# Patient Record
Sex: Female | Born: 2000 | Race: White | Hispanic: No | Marital: Single | Smoking: Current every day smoker
Health system: Southern US, Community
[De-identification: ages and names within clinical notes are randomized; demographics above are authoritative.]

## PROBLEM LIST (undated history)

## (undated) DIAGNOSIS — F419 Anxiety disorder, unspecified: Secondary | ICD-10-CM

## (undated) DIAGNOSIS — Z23 Encounter for immunization: Secondary | ICD-10-CM

## (undated) DIAGNOSIS — F32A Depression, unspecified: Secondary | ICD-10-CM

## (undated) DIAGNOSIS — F1411 Cocaine abuse, in remission: Secondary | ICD-10-CM

## (undated) DIAGNOSIS — F329 Major depressive disorder, single episode, unspecified: Secondary | ICD-10-CM

## (undated) DIAGNOSIS — A749 Chlamydial infection, unspecified: Secondary | ICD-10-CM

## (undated) DIAGNOSIS — H539 Unspecified visual disturbance: Secondary | ICD-10-CM

## (undated) HISTORY — DX: Encounter for immunization: Z23

## (undated) HISTORY — DX: Cocaine abuse, in remission: F14.11

## (undated) HISTORY — DX: Chlamydial infection, unspecified: A74.9

---
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
## 2020-02-07 ENCOUNTER — Encounter: Payer: Self-pay | Admitting: Psychiatry

## 2020-05-08 ENCOUNTER — Encounter: Payer: Self-pay | Admitting: Obstetrics and Gynecology

## 2020-06-11 ENCOUNTER — Ambulatory Visit (INDEPENDENT_AMBULATORY_CARE_PROVIDER_SITE_OTHER): Payer: 59 | Admitting: Obstetrics and Gynecology

## 2020-06-11 ENCOUNTER — Encounter: Payer: Self-pay | Admitting: Obstetrics and Gynecology

## 2020-06-11 ENCOUNTER — Other Ambulatory Visit (HOSPITAL_COMMUNITY)
Admission: RE | Admit: 2020-06-11 | Discharge: 2020-06-11 | Disposition: A | Discharge status: Home / Self Care | Payer: 59 | Source: Ambulatory Visit | Attending: Obstetrics and Gynecology | Admitting: Obstetrics and Gynecology

## 2020-06-11 ENCOUNTER — Other Ambulatory Visit: Payer: Self-pay

## 2020-06-11 VITALS — BP 100/70 | Ht 67.5 in | Wt 122.0 lb

## 2020-06-11 DIAGNOSIS — R1010 Upper abdominal pain, unspecified: Secondary | ICD-10-CM

## 2020-06-11 DIAGNOSIS — Z113 Encounter for screening for infections with a predominantly sexual mode of transmission: Secondary | ICD-10-CM | POA: Diagnosis not present

## 2020-06-11 DIAGNOSIS — Z01419 Encounter for gynecological examination (general) (routine) without abnormal findings: Secondary | ICD-10-CM | POA: Diagnosis not present

## 2020-06-11 NOTE — Progress Notes (Signed)
PCP:  Gildardo Pounds, MD   Chief Complaint  Patient presents with  . Gynecologic Exam    Abdominal pain x months     HPI:      Ms. NALAH MACIOCE is a 20 y.o. No obstetric history on file. whose LMP was No LMP recorded (lmp unknown). (Menstrual status: Irregular Periods)., presents today for her NP annual examination.  Her menses were regular every 28-30 days, lasting 5 days on OCPs/sprintec.  Dysmenorrhea mild, occurring first 1-2 days of flow. She does not have intermenstrual bleeding. Stopped OCPs 1 1/2 months ago since not needed for contraception. No menses yet but is starting to get cramping. Hx of menses Q6 wks age 21 prior to OCPs.    Sex activity: has sex with females. Had female partner in past Last Pap: N/A due to age Hx of STDs: none  Having issues with abd pain for several months, with occas vomiting/diarrhea. Thinks it's related to the many meds she is on for psych. Sx improved with eating; often times feels like gas pains. Has decreased appetite.  There is no FH of breast cancer. There is no FH of ovarian cancer. The patient does do self-breast exams.  Tobacco use: vapes daily Alcohol use: infrequent Daily marijuana use.  Exercise: moderately active  She does get adequate calcium but not Vitamin D in her diet.   Gardasil completed.   Past Medical History:  Diagnosis Date  . Anxiety   . Depression   . Vision abnormalities    wears glasses    Past Surgical History:  Procedure Laterality Date  . INGUINAL HERNIA PEDIATRIC WITH LAPAROSCOPIC EXAM Left 04/22/2017   Procedure: INGUINAL HERNIA PEDIATRIC WITH LAP LOOK OF PELVIC FLOOR;  Surgeon: Leonia Corona, MD;  Location: [facility];  Service: Pediatrics;  Laterality: Left;  . INGUINAL HERNIA REPAIR Right 2008    History reviewed. No pertinent family history.  Social History   Socioeconomic History  . Marital status: Single    Spouse name: Not on file  . Number of children: Not on file  .  Years of education: Not on file  . Highest education level: Not on file  Occupational History  . Not on file  Tobacco Use  . Smoking status: Never Smoker  . Smokeless tobacco: Never Used  Vaping Use  . Vaping Use: Every day  Substance and Sexual Activity  . Alcohol use: No  . Drug use: Yes    Types: Marijuana    Comment: social  . Sexual activity: Yes    Birth control/protection: None    Comment: same sex partner  Other Topics Concern  . Not on file  Social History Narrative  . Not on file   Social Determinants of Health   Financial Resource Strain: Not on file  Food Insecurity: Not on file  Transportation Needs: Not on file  Physical Activity: Not on file  Stress: Not on file  Social Connections: Not on file  Intimate Partner Violence: Not on file     Current Outpatient Medications:  .  ARIPiprazole (ABILIFY) 5 MG tablet, Take 5 mg by mouth every morning., Disp: , Rfl:  .  busPIRone (BUSPAR) 10 MG tablet, Take 20 mg by mouth 2 (two) times daily., Disp: , Rfl:  .  escitalopram (LEXAPRO) 10 MG tablet, Take 10 mg by mouth daily., Disp: , Rfl:  .  guanFACINE (INTUNIV) 2 MG TB24 ER tablet, Take 2 mg by mouth 2 (two) times daily., Disp: ,  Rfl:  .  lamoTRIgine (LAMICTAL) 200 MG tablet, Take 1 tablet (200 mg total) by mouth at bedtime., Disp: 90 tablet, Rfl: 2 .  lithium carbonate (LITHOBID) 300 MG CR tablet, SMARTSIG:1 Tablet(s) By Mouth Every Evening, Disp: , Rfl:  .  Multiple Vitamin (MULTIVITAMIN) capsule, Take 1 capsule by mouth daily., Disp: , Rfl:  .  prazosin (MINIPRESS) 5 MG capsule, SMARTSIG:1 Capsule(s) By Mouth Every Evening, Disp: , Rfl:      ROS:  Review of Systems  Constitutional: Negative for fatigue, fever and unexpected weight change.  Respiratory: Negative for cough, shortness of breath and wheezing.   Cardiovascular: Negative for chest pain, palpitations and leg swelling.  Gastrointestinal: Positive for abdominal pain, diarrhea and vomiting. Negative  for blood in stool, constipation and nausea.  Endocrine: Negative for cold intolerance, heat intolerance and polyuria.  Genitourinary: Negative for dyspareunia, dysuria, flank pain, frequency, genital sores, hematuria, menstrual problem, pelvic pain, urgency, vaginal bleeding, vaginal discharge and vaginal pain.  Musculoskeletal: Negative for back pain, joint swelling and myalgias.  Skin: Negative for rash.  Neurological: Negative for dizziness, syncope, light-headedness, numbness and headaches.  Hematological: Negative for adenopathy.  Psychiatric/Behavioral: Negative for agitation, confusion, sleep disturbance and suicidal ideas. The patient is not nervous/anxious.   BREAST: No symptoms   Objective: BP 100/70   Ht 5' 7.5" (1.715 m)   Wt 122 lb (55.3 kg)   LMP  (LMP Unknown)   BMI 18.83 kg/m    Physical Exam Constitutional:      Appearance: She is well-developed.  Genitourinary:     Vulva normal.     Right Labia: No rash, tenderness or lesions.    Left Labia: No tenderness, lesions or rash.    No vaginal discharge, erythema or tenderness.      Right Adnexa: not tender and no mass present.    Left Adnexa: not tender and no mass present.    No cervical friability or polyp.     Uterus is not enlarged or tender.  Breasts:     Right: No mass, nipple discharge, skin change or tenderness.     Left: No mass, nipple discharge, skin change or tenderness.    Neck:     Thyroid: No thyromegaly.  Cardiovascular:     Rate and Rhythm: Normal rate and regular rhythm.     Heart sounds: Normal heart sounds. No murmur heard.   Pulmonary:     Effort: Pulmonary effort is normal.     Breath sounds: Normal breath sounds.  Abdominal:     Palpations: Abdomen is soft.     Tenderness: There is no abdominal tenderness. There is no guarding or rebound.  Musculoskeletal:        General: Normal range of motion.     Cervical back: Normal range of motion.  Lymphadenopathy:     Cervical: No  cervical adenopathy.  Neurological:     General: No focal deficit present.     Mental Status: She is alert and oriented to person, place, and time.     Cranial Nerves: No cranial nerve deficit.  Skin:    General: Skin is warm and dry.  Psychiatric:        Mood and Affect: Mood normal.        Behavior: Behavior normal.        Thought Content: Thought content normal.        Judgment: Judgment normal.  Vitals reviewed.     Assessment/Plan: Encounter for annual routine gynecological examination  Screening for STD (sexually transmitted disease) - Plan: Cervicovaginal ancillary only  Pain of upper abdomen--being followed by psych. Neg exam. Question side effect of Rx.     GYN counsel adequate intake of calcium and vitamin D, diet and exercise     F/U  Return in about 1 year (around 06/11/2021).  Alicia B. Copland, PA-C 06/11/2020 3:16 PM

## 2020-06-11 NOTE — Patient Instructions (Signed)
I value your feedback and you entrusting us with your care. If you get a [facility] patient survey, I would appreciate you taking the time to let us know about your experience today. Thank you! ? ? ?

## 2020-06-13 LAB — CERVICOVAGINAL ANCILLARY ONLY
Chlamydia: NEGATIVE
Comment: NEGATIVE
Comment: NORMAL
Neisseria Gonorrhea: NEGATIVE

## 2020-12-16 ENCOUNTER — Telehealth: Payer: 59

## 2020-12-16 DIAGNOSIS — R1013 Epigastric pain: Secondary | ICD-10-CM

## 2020-12-16 DIAGNOSIS — R112 Nausea with vomiting, unspecified: Secondary | ICD-10-CM

## 2020-12-16 NOTE — Telephone Encounter (Signed)
Amy aware.

## 2020-12-16 NOTE — Telephone Encounter (Signed)
Order placed, GI will contact pt with appt info

## 2020-12-16 NOTE — Telephone Encounter (Signed)
Pt's mom, Amy, calling; pt is having a lot of stomach issues; would like a referral to GI.  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  Has [employer] ins; would like GI that accepts [place].

## 2021-02-10 ENCOUNTER — Other Ambulatory Visit: Payer: Self-pay

## 2021-02-10 ENCOUNTER — Inpatient Hospital Stay (HOSPITAL_COMMUNITY)
Admission: RE | Admit: 2021-02-10 | Discharge: 2021-02-17 | DRG: 885 | Disposition: A | Discharge status: Home / Self Care | Payer: 59 | Attending: Psychiatry | Admitting: Psychiatry

## 2021-02-10 ENCOUNTER — Encounter (HOSPITAL_COMMUNITY): Payer: Self-pay | Admitting: Psychiatry

## 2021-02-10 DIAGNOSIS — Z3202 Encounter for pregnancy test, result negative: Secondary | ICD-10-CM | POA: Diagnosis present

## 2021-02-10 DIAGNOSIS — Z79899 Other long term (current) drug therapy: Secondary | ICD-10-CM | POA: Diagnosis not present

## 2021-02-10 DIAGNOSIS — Z20822 Contact with and (suspected) exposure to covid-19: Secondary | ICD-10-CM | POA: Diagnosis present

## 2021-02-10 DIAGNOSIS — F4312 Post-traumatic stress disorder, chronic: Secondary | ICD-10-CM | POA: Diagnosis present

## 2021-02-10 DIAGNOSIS — R45851 Suicidal ideations: Secondary | ICD-10-CM | POA: Diagnosis present

## 2021-02-10 DIAGNOSIS — F3130 Bipolar disorder, current episode depressed, mild or moderate severity, unspecified: Secondary | ICD-10-CM | POA: Diagnosis present

## 2021-02-10 DIAGNOSIS — F3163 Bipolar disorder, current episode mixed, severe, without psychotic features: Secondary | ICD-10-CM | POA: Diagnosis not present

## 2021-02-10 DIAGNOSIS — F3160 Bipolar disorder, current episode mixed, unspecified: Secondary | ICD-10-CM | POA: Diagnosis present

## 2021-02-10 DIAGNOSIS — F1410 Cocaine abuse, uncomplicated: Secondary | ICD-10-CM | POA: Diagnosis present

## 2021-02-10 DIAGNOSIS — F1210 Cannabis abuse, uncomplicated: Secondary | ICD-10-CM

## 2021-02-10 LAB — RESP PANEL BY RT-PCR (FLU A&B, COVID) ARPGX2
Influenza A by PCR: NEGATIVE
Influenza B by PCR: NEGATIVE
SARS Coronavirus 2 by RT PCR: NEGATIVE

## 2021-02-10 MED ORDER — NICOTINE 21 MG/24HR TD PT24
21.0000 mg | MEDICATED_PATCH | Freq: Every day | TRANSDERMAL | Status: DC
  Administered 2021-02-10 – 2021-02-17 (×8): 21 mg via TRANSDERMAL
  Filled 2021-02-10 (×10): qty 1

## 2021-02-10 MED ORDER — ALUM & MAG HYDROXIDE-SIMETH 200-200-20 MG/5ML PO SUSP: 30.0000 mL | ORAL | Status: DC | PRN

## 2021-02-10 MED ORDER — TRAZODONE HCL 50 MG PO TABS
50.0000 mg | ORAL_TABLET | Freq: Every evening | ORAL | Status: DC | PRN
  Administered 2021-02-11 – 2021-02-16 (×6): 50 mg via ORAL
  Filled 2021-02-10 (×6): qty 1

## 2021-02-10 MED ORDER — HYDROXYZINE HCL 25 MG PO TABS
25.0000 mg | ORAL_TABLET | Freq: Three times a day (TID) | ORAL | Status: DC | PRN
  Filled 2021-02-10: qty 1

## 2021-02-10 MED ORDER — ACETAMINOPHEN 325 MG PO TABS: 650.0000 mg | ORAL_TABLET | Freq: Four times a day (QID) | ORAL | Status: DC | PRN

## 2021-02-10 MED ORDER — MAGNESIUM HYDROXIDE 400 MG/5ML PO SUSP: 30.0000 mL | Freq: Every day | ORAL | Status: DC | PRN
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
## 2021-02-10 NOTE — Group Note (Signed)
LCSW Group Therapy Note   Group Date: 02/10/2021 Start Time: 1300 End Time: 1400   Type of Therapy and Topic:  Group Therapy:   Participation Level:  Did Not Attend   Description of Group: In this process group, patients discussed using strengths to work toward goals and address challenges.  Patients identified two positive things about themselves and one goal they were working on.  Patients were given the opportunity to share openly and support each other's plan for self-empowerment.  The group discussed the value of gratitude and were encouraged to have a daily reflection of positive characteristics or circumstances.  Patients were encouraged to identify a plan to utilize their strengths to work on current challenges and goals.   Therapeutic Goals Patient will verbalize personal strengths/positive qualities and relate how these can assist with achieving desired personal goals Patients will verbalize affirmation of peers plans for personal change and goal setting Patients will explore the value of gratitude and positive focus as related to successful achievement of goals Patients will verbalize a plan for regular reinforcement of personal positive qualities and circumstances.   Summary of Patient Progress:  Did not attend      Therapeutic Modalities Cognitive Behavioral Therapy Motivational Interviewing  Aram Beecham, [place] 02/10/2021  1:54 PM

## 2021-02-10 NOTE — Plan of Care (Signed)
Nurse discussed coping skills with patient.  
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
## 2021-02-10 NOTE — BHH Group Notes (Signed)
Patient did not attend group.    Spiritual care group on grief and loss facilitated by chaplain Katy Coretta Leisey, BCC   Group Goal:   Support / Education around grief and loss   Members engage in facilitated group support and psycho-social education.   Group Description:   Following introductions and group rules, group members engaged in facilitated group dialog and support around topic of loss, with particular support around experiences of loss in their lives. Group Identified types of loss (relationships / self / things) and identified patterns, circumstances, and changes that precipitate losses. Reflected on thoughts / feelings around loss, normalized grief responses, and recognized variety in grief experience. Group noted Worden's four tasks of grief in discussion.   Group drew on Adlerian / Rogerian, narrative, MI,    

## 2021-02-10 NOTE — BHH Group Notes (Signed)
Pt didn attend tonights wrap up group.     The focus of this group is to help patients review their daily goal of treatment and discuss progress on daily workbooks.

## 2021-02-10 NOTE — Plan of Care (Signed)
Nurse discussed depression and coping skills with patient.  

## 2021-02-11 DIAGNOSIS — F1410 Cocaine abuse, uncomplicated: Secondary | ICD-10-CM | POA: Diagnosis present

## 2021-02-11 DIAGNOSIS — F3163 Bipolar disorder, current episode mixed, severe, without psychotic features: Secondary | ICD-10-CM

## 2021-02-11 DIAGNOSIS — F3160 Bipolar disorder, current episode mixed, unspecified: Secondary | ICD-10-CM | POA: Diagnosis present

## 2021-02-11 LAB — RAPID URINE DRUG SCREEN, HOSP PERFORMED
Amphetamines: NOT DETECTED
Barbiturates: NOT DETECTED
Benzodiazepines: NOT DETECTED
Cocaine: POSITIVE — AB
Opiates: NOT DETECTED
Tetrahydrocannabinol: POSITIVE — AB

## 2021-02-11 LAB — PREGNANCY, URINE: Preg Test, Ur: NEGATIVE

## 2021-02-11 MED ORDER — METHOCARBAMOL 500 MG PO TABS: 500.0000 mg | ORAL_TABLET | Freq: Three times a day (TID) | ORAL | Status: AC | PRN

## 2021-02-11 MED ORDER — LAMOTRIGINE 25 MG PO TABS
25.0000 mg | ORAL_TABLET | Freq: Every day | ORAL | Status: DC
  Administered 2021-02-11: 25 mg via ORAL
  Filled 2021-02-11 (×3): qty 1

## 2021-02-11 MED ORDER — ONDANSETRON 4 MG PO TBDP
4.0000 mg | ORAL_TABLET | Freq: Four times a day (QID) | ORAL | Status: AC | PRN
  Administered 2021-02-14: 4 mg via ORAL
  Filled 2021-02-11: qty 1

## 2021-02-11 MED ORDER — DICYCLOMINE HCL 20 MG PO TABS: 20.0000 mg | ORAL_TABLET | Freq: Four times a day (QID) | ORAL | Status: AC | PRN

## 2021-02-11 MED ORDER — ONDANSETRON 4 MG PO TBDP
ORAL_TABLET | ORAL | Status: AC
  Filled 2021-02-11: qty 1

## 2021-02-11 MED ORDER — ARIPIPRAZOLE 10 MG PO TABS
10.0000 mg | ORAL_TABLET | Freq: Every day | ORAL | Status: DC
  Administered 2021-02-11: 10 mg via ORAL
  Filled 2021-02-11 (×3): qty 1

## 2021-02-11 MED ORDER — LOPERAMIDE HCL 2 MG PO CAPS
2.0000 mg | ORAL_CAPSULE | ORAL | Status: AC | PRN
  Administered 2021-02-14: 2 mg via ORAL
  Administered 2021-02-14: 4 mg via ORAL
  Filled 2021-02-11: qty 2
  Filled 2021-02-11: qty 1

## 2021-02-11 MED ORDER — ONDANSETRON 4 MG PO TBDP: 4.0000 mg | ORAL_TABLET | Freq: Three times a day (TID) | ORAL | Status: DC | PRN

## 2021-02-11 MED ORDER — HYDROXYZINE HCL 25 MG PO TABS
25.0000 mg | ORAL_TABLET | Freq: Four times a day (QID) | ORAL | Status: AC | PRN
  Administered 2021-02-12: 25 mg via ORAL
  Filled 2021-02-11: qty 1

## 2021-02-11 MED ORDER — NAPROXEN 500 MG PO TABS: 500.0000 mg | ORAL_TABLET | Freq: Two times a day (BID) | ORAL | Status: AC | PRN

## 2021-02-11 MED ORDER — LITHIUM CARBONATE ER 300 MG PO TBCR
300.0000 mg | EXTENDED_RELEASE_TABLET | Freq: Two times a day (BID) | ORAL | Status: DC
  Administered 2021-02-11 – 2021-02-17 (×12): 300 mg via ORAL
  Filled 2021-02-11 (×18): qty 1

## 2021-02-11 MED ORDER — ARIPIPRAZOLE 15 MG PO TABS
15.0000 mg | ORAL_TABLET | Freq: Every day | ORAL | Status: DC
  Administered 2021-02-12 – 2021-02-14 (×3): 15 mg via ORAL
  Filled 2021-02-11 (×5): qty 1

## 2021-02-11 NOTE — Progress Notes (Signed)
Pt sleep much of the evening     02/11/21 0000  Psych Admission Type (Psych Patients Only)  Admission Status Voluntary  Psychosocial Assessment  Patient Complaints None  Eye Contact Fair  Facial Expression Anxious;Sad;Worried  Affect Sad;Anxious;Threatening;Depressed  Speech Logical/coherent  Interaction Assertive  Motor Activity Other (Comment) (appropriate)  Appearance/Hygiene Disheveled  Behavior Characteristics Cooperative  Mood Depressed;Anxious  Aggressive Behavior  Effect No apparent injury  Thought Process  Coherency WDL  Content WDL  Delusions None reported or observed  Perception WDL  Hallucination None reported or observed  Judgment Poor  Confusion None  Danger to Self  Current suicidal ideation? Passive  Self-Injurious Behavior Some self-injurious ideation observed or expressed.  No lethal plan expressed   Agreement Not to Harm Self Yes  Description of Agreement contracts for safety verbal  Danger to Others  Danger to Others None reported or observed

## 2021-02-11 NOTE — Group Note (Signed)
OrientationGroup Topic: Goal Setting  Group Date: 02/11/2021 Start Time: 0830 End Time: 0900 Facilitators: Kaisyn Millea, Deforest Hoyles, NT  [facility]  Number of Participants: 14  Group Focus: activities of daily living skills and goals/reality orientation  Name: BRADI ARBUTHNOT Date of Birth: 04/16/2001  MR: [id number]    Quality of Participation: Pt did not attend goals/orientation group.

## 2021-02-11 NOTE — Progress Notes (Signed)
Pt had episode of crying spell, wanting to leave ( wanted to sign 72 hr request for D/C). Writer talked to pt and pt calmed down. Pt educated on relaxation techniques and informed to talk to the doctor tomorrow and if she did not agree with what the doctor was saying then she could sign the 72 hr request for D/C then.     02/11/21 2200  Psych Admission Type (Psych Patients Only)  Admission Status Voluntary  Psychosocial Assessment  Patient Complaints Anxiety;Depression  Eye Contact Fair  Facial Expression Animated;Anxious  Affect Anxious;Depressed  Speech Logical/coherent  Interaction Assertive  Motor Activity Other (Comment) (WDL)  Appearance/Hygiene Unremarkable  Behavior Characteristics Cooperative  Mood Anxious;Depressed  Thought Process  Coherency WDL  Content WDL  Delusions None reported or observed  Perception WDL  Hallucination None reported or observed  Judgment Poor  Confusion None  Danger to Self  Current suicidal ideation? Passive  Self-Injurious Behavior Some self-injurious ideation observed or expressed.  No lethal plan expressed   Agreement Not to Harm Self Yes  Description of Agreement contracts for safety verbal  Danger to Others  Danger to Others None reported or observed
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
## 2021-02-11 NOTE — Progress Notes (Signed)
Pt denies HI/AVH but pt stated "I just want to die." "I have thoughts of hurting myself but I would not act on it." Pt verbally agrees to approach staff if these become apparent or before harming themselves/others. Rates depression 10/10. Rates anxiety 6/10. Rates pain 0/10. Pt stated that she is always depressed and is always anxious. Pt stated that she is in a bad mood. Pts goal for today is to get out of bed. Pt stated "I want to get some crack. I am a crack addict so I am craving it right now and the reason I came here is because I want to get clean." Pt stated that she was irritable because of all the questions we have to ask but understands that we have to do it. Pt has been pleasant the rest of the day and in her room for the majority of the day. Scheduled medications administered to Pt, per MD orders. RN provided support and encouragement to Pt. Q15 min safety checks implemented and continued. Pt safe on the unit. RN will continue to monitor and intervene as needed.   02/11/21 0925  Psych Admission Type (Psych Patients Only)  Admission Status Voluntary  Psychosocial Assessment  Patient Complaints Anxiety;Depression;Irritability  Eye Contact Fair  Facial Expression Animated;Anxious  Affect Anxious;Depressed  Speech Logical/coherent  Interaction Assertive  Motor Activity Other (Comment) (WDL)  Appearance/Hygiene Unremarkable  Behavior Characteristics Cooperative;Calm;Appropriate to situation  Mood Anxious;Depressed;Pleasant  Thought Process  Coherency WDL  Content WDL  Delusions None reported or observed  Perception WDL  Hallucination None reported or observed  Judgment Poor  Confusion None  Danger to Self  Current suicidal ideation? Passive  Self-Injurious Behavior Some self-injurious ideation observed or expressed.  No lethal plan expressed   Agreement Not to Harm Self Yes  Description of Agreement contracts for safety verbal  Danger to Others  Danger to Others None reported or  observed

## 2021-02-11 NOTE — [facility] Group Notes (Signed)
Pt was attentive and appropriate while attending tonight's wrap up group. Pt was able to discuss that  she has mental health and substance abuse issues. Pt stated that she has to get mental health stable then work on drug use.  The focus of this group is to help patients review their daily goal of treatment and discuss progress on daily workbooks.

## 2021-02-11 NOTE — [facility] Group Notes (Signed)
Patient did not attend the Psycho-Ed group. 

## 2021-02-12 ENCOUNTER — Encounter (HOSPITAL_COMMUNITY): Payer: Self-pay

## 2021-02-12 LAB — COMPREHENSIVE METABOLIC PANEL
ALT: 44 U/L (ref 0–44)
AST: 19 U/L (ref 15–41)
Albumin: 4 g/dL (ref 3.5–5.0)
Alkaline Phosphatase: 53 U/L (ref 38–126)
Anion gap: 6 (ref 5–15)
BUN: 8 mg/dL (ref 6–20)
CO2: 28 mmol/L (ref 22–32)
Calcium: 9 mg/dL (ref 8.9–10.3)
Chloride: 105 mmol/L (ref 98–111)
Creatinine, Ser: 0.8 mg/dL (ref 0.44–1.00)
GFR, Estimated: >60 mL/min (ref >60)
Glucose, Bld: 100 mg/dL — ABNORMAL HIGH (ref 70–99)
Potassium: 4.2 mmol/L (ref 3.5–5.1)
Sodium: 139 mmol/L (ref 135–145)
Total Bilirubin: 0.7 mg/dL (ref 0.3–1.2)
Total Protein: 6.4 g/dL — ABNORMAL LOW (ref 6.5–8.1)

## 2021-02-12 LAB — CBC
HCT: 41.9 % (ref 36.0–46.0)
Hemoglobin: 13.7 g/dL (ref 12.0–15.0)
MCH: 29.7 pg (ref 26.0–34.0)
MCHC: 32.7 g/dL (ref 30.0–36.0)
MCV: 90.7 fL (ref 80.0–100.0)
Platelets: 173 10*3/uL (ref 150–400)
RBC: 4.62 MIL/uL (ref 3.87–5.11)
RDW: 11.9 % (ref 11.5–15.5)
WBC: 4.8 10*3/uL (ref 4.0–10.5)
nRBC: 0 % (ref 0.0–0.2)

## 2021-02-12 LAB — HEMOGLOBIN A1C
Hgb A1c MFr Bld: 4.6 % — ABNORMAL LOW (ref 4.8–5.6)
Mean Plasma Glucose: 85.32 mg/dL

## 2021-02-12 LAB — LIPID PANEL
Cholesterol: 140 mg/dL (ref 0–200)
HDL: 59 mg/dL (ref >40)
LDL Cholesterol: 76 mg/dL (ref 0–99)
Total CHOL/HDL Ratio: 2.4 RATIO
Triglycerides: 27 mg/dL (ref <150)
VLDL: 5 mg/dL (ref 0–40)

## 2021-02-12 LAB — TSH: TSH: 1.54 u[IU]/mL (ref 0.350–4.500)

## 2021-02-12 LAB — LITHIUM LEVEL: Lithium Lvl: 0.27 mmol/L — ABNORMAL LOW (ref 0.60–1.20)

## 2021-02-12 MED ORDER — GABAPENTIN 100 MG PO CAPS
100.0000 mg | ORAL_CAPSULE | Freq: Three times a day (TID) | ORAL | Status: DC
  Administered 2021-02-12 – 2021-02-13 (×6): 100 mg via ORAL
  Filled 2021-02-12 (×14): qty 1

## 2021-02-12 NOTE — Progress Notes (Signed)
[facility] MD Progress Note  02/12/2021 7:09 AM Deborah Mckee  MRN:  [id number]  Chief Complaint: SI and substance abuse  Deborah Mckee is a 20 y.o. female with a history of bipolar d/o and polysubstance abuse, who was initially admitted for inpatient psychiatric hospitalization on 02/10/2021 for management of SI and mixed manic episode in the context of ongoing cannabis and cocaine abuse. The patient is currently on Hospital Day 2.   Chart Review from last 24 hours:  The patient's chart was reviewed and nursing notes were reviewed. The patient's case was discussed in multidisciplinary team meeting. She expressed passive SI to staff yesterday and stayed in her room the majority of day shift. Per nursing, overnight patient was agitated and was considering signing 72 hour request for discharge but was able to be redirected and soothed by staff. Per MAR she was compliant with scheduled medications and received Trazodone X1 PRN for sleep.   Information Obtained Today During Patient Interview: The patient was seen and evaluated on the unit. On assessment today the patient reports that she had a panic attack last night which triggered her desire to leave since she felt confined on the unit. She states she talked to her mother on the phone who told her if she signed out AMA she could not return home and would be expected to pay her hospital bill. She states this made her reconsider and decide to stay and complete her treatment. She admits she is upset with her mother but recognizes that she needs help with her bipolar and her addictions. She describes feeling "exhausted" today and denies feeling mood elevated or grandiose. She denies racing thoughts, AVH, paranoia, ideas of reference, first rank symptoms, SI or HI. She admits to having some cravings for drugs and tobacco but denies current signs of acute withdrawal other than cold chills and sweats. She reports good sleep and poor appetite stating she does not like  the food choices here. We discussed her medication regimen and she is not sure how compliant she will be with Lamictal as an outpatient. Due to risk of SJS with hap-hazard dosing of this medication, she elects to discontinue Lamictal after discussion. I advised that with her being on Lithium and Abilify I am not sure she needs additional Lamictal at this time. I discussed start of Neurontin to help with residual anxiety and cravings and she agrees to med trial. I encouraged her to talk with SW about outpatient addictions treatment options after discharge.   Principal Problem: Bipolar affective disorder, current episode mixed (HCC) Diagnosis: Principal Problem:   Bipolar affective disorder, current episode mixed (HCC) Active Problems:   Chronic post-traumatic stress disorder   Cannabis abuse   Cocaine abuse (HCC)  Total Time Spent in Direct Patient Care:  I personally spent 30 minutes on the unit in direct patient care. The direct patient care time included face-to-face time with the patient, reviewing the patient's chart, communicating with other professionals, and coordinating care. Greater than 50% of this time was spent in counseling or coordinating care with the patient regarding goals of hospitalization, psycho-education, and discharge planning needs.  Past Psychiatric History: see H&P  Past Medical History:  Past Medical History:  Diagnosis Date   Anxiety    Depression    Vaccine for human papilloma virus (HPV) types 6, 11, 16, and 18 administered    Vision abnormalities    wears glasses    Past Surgical History:  Procedure Laterality Date   INGUINAL HERNIA  PEDIATRIC WITH LAPAROSCOPIC EXAM Left 04/22/2017   Procedure: INGUINAL HERNIA PEDIATRIC WITH LAP LOOK OF PELVIC FLOOR;  Surgeon: Leonia Corona, MD;  Location: [facility];  Service: Pediatrics;  Laterality: Left;   INGUINAL HERNIA REPAIR Right 2008   Family History: see H&P  Family Psychiatric  History: see H&P    Social History:  Social History   Substance and Sexual Activity  Alcohol Use No     Social History   Substance and Sexual Activity  Drug Use Yes   Types: Marijuana   Comment: social    Social History   Socioeconomic History   Marital status: Single    Spouse name: Not on file   Number of children: Not on file   Years of education: Not on file   Highest education level: Not on file  Occupational History   Not on file  Tobacco Use   Smoking status: Never   Smokeless tobacco: Never  Vaping Use   Vaping Use: Every day  Substance and Sexual Activity   Alcohol use: No   Drug use: Yes    Types: Marijuana    Comment: social   Sexual activity: Yes    Birth control/protection: None    Comment: same sex partner  Other Topics Concern   Not on file  Social History Narrative   Not on file   Social Determinants of Health   Financial Resource Strain: Not on file  Food Insecurity: Not on file  Transportation Needs: Not on file  Physical Activity: Not on file  Stress: Not on file  Social Connections: Not on file   Additional Social History:    Pain Medications: SEE MAR Prescriptions: SEE MAR Over the Counter: SEE MAR History of alcohol / drug use?: Yes Withdrawal Symptoms: Irritability Name of Substance 1: Cocaine 1 - Age of First Use: 20 yrs old 1 - Amount (size/oz): 1 gram 1 - Frequency: daily 1 - Duration: on-going 1 - Last Use / Amount: Friday; "a couple of illness" 1 - Method of Aquiring: unknown 1- Route of Use: unnown    Sleep: Good  Appetite:  Poor  Current Medications: Current Facility-Administered Medications  Medication Dose Route Frequency Provider Last Rate Last Admin   acetaminophen (TYLENOL) tablet 650 mg  650 mg Oral Q6H PRN Thermon Leyland, NP       alum & mag hydroxide-simeth (MAALOX/MYLANTA) 200-200-20 MG/5ML suspension 30 mL  30 mL Oral Q4H PRN Fransisca Kaufmann A, NP       ARIPiprazole (ABILIFY) tablet 15 mg  15 mg Oral Daily Halah Whiteside  E, MD       dicyclomine (BENTYL) tablet 20 mg  20 mg Oral Q6H PRN Mason Jim, Nabria Nevin E, MD       hydrOXYzine (ATARAX/VISTARIL) tablet 25 mg  25 mg Oral Q6H PRN Mason Jim, Latese Dufault E, MD       lamoTRIgine (LAMICTAL) tablet 25 mg  25 mg Oral Daily Park Pope, MD   25 mg at 02/11/21 1642   lithium carbonate (LITHOBID) CR tablet 300 mg  300 mg Oral Q12H Park Pope, MD   300 mg at 02/11/21 2131   loperamide (IMODIUM) capsule 2-4 mg  2-4 mg Oral PRN Comer Locket, MD       magnesium hydroxide (MILK OF MAGNESIA) suspension 30 mL  30 mL Oral Daily PRN Fransisca Kaufmann A, NP       methocarbamol (ROBAXIN) tablet 500 mg  500 mg Oral Q8H PRN Comer Locket, MD  naproxen (NAPROSYN) tablet 500 mg  500 mg Oral BID PRN Comer Locket, MD       nicotine (NICODERM CQ - dosed in mg/24 hours) patch 21 mg  21 mg Transdermal Daily Fransisca Kaufmann A, NP   21 mg at 02/11/21 0926   ondansetron (ZOFRAN-ODT) disintegrating tablet 4 mg  4 mg Oral Q6H PRN Comer Locket, MD       traZODone (DESYREL) tablet 50 mg  50 mg Oral QHS PRN Lamar Sprinkles, MD   50 mg at 02/11/21 2130    Lab Results:  Results for orders placed or performed during the hospital encounter of 02/10/21 (from the past 48 hour(s))  Resp Panel by RT-PCR (Flu A&B, Covid) Nasopharyngeal Swab     Status: None   Collection Time: 02/10/21 12:47 PM   Specimen: Nasopharyngeal Swab; Nasopharyngeal(NP) swabs in vial transport medium  Result Value Ref Range   SARS Coronavirus 2 by RT PCR NEGATIVE NEGATIVE    Comment: (NOTE) SARS-CoV-2 target nucleic acids are NOT DETECTED.  The SARS-CoV-2 RNA is generally detectable in upper respiratory specimens during the acute phase of infection. The lowest concentration of SARS-CoV-2 viral copies this assay can detect is 138 copies/mL. A negative result does not preclude SARS-Cov-2 infection and should not be used as the sole basis for treatment or other patient management decisions. A negative result may occur with   improper specimen collection/handling, submission of specimen other than nasopharyngeal swab, presence of viral mutation(s) within the areas targeted by this assay, and inadequate number of viral copies(<138 copies/mL). A negative result must be combined with clinical observations, patient history, and epidemiological information. The expected result is Negative.  Fact Sheet for Patients:  BloggerCourse.com  Fact Sheet for Healthcare Providers:  SeriousBroker.it  This test is no t yet approved or cleared by the [place] FDA and  has been authorized for detection and/or diagnosis of SARS-CoV-2 by FDA under an Emergency Use Authorization (EUA). This EUA will remain  in effect (meaning this test can be used) for the duration of the COVID-19 declaration under Section 564(b)(1) of the Act, 21 U.S.C.section 360bbb-3(b)(1), unless the authorization is terminated  or revoked sooner.       Influenza A by PCR NEGATIVE NEGATIVE   Influenza B by PCR NEGATIVE NEGATIVE    Comment: (NOTE) The Xpert Xpress SARS-CoV-2/FLU/RSV plus assay is intended as an aid in the diagnosis of influenza from Nasopharyngeal swab specimens and should not be used as a sole basis for treatment. Nasal washings and aspirates are unacceptable for Xpert Xpress SARS-CoV-2/FLU/RSV testing.  Fact Sheet for Patients: BloggerCourse.com  Fact Sheet for Healthcare Providers: SeriousBroker.it  This test is not yet approved or cleared by the [place] FDA and has been authorized for detection and/or diagnosis of SARS-CoV-2 by FDA under an Emergency Use Authorization (EUA). This EUA will remain in effect (meaning this test can be used) for the duration of the COVID-19 declaration under Section 564(b)(1) of the Act, 21 U.S.C. section 360bbb-3(b)(1), unless the authorization is terminated or revoked.  Performed at  [facility], [address] [place], [place] [address]   Pregnancy, urine     Status: None   Collection Time: 02/11/21  1:13 PM  Result Value Ref Range   Preg Test, Ur NEGATIVE NEGATIVE    Comment:        THE SENSITIVITY OF THIS METHODOLOGY IS >20 mIU/mL. Performed at [facility], [address], [place], [place] [address]  Rapid urine drug screen (hospital performed)     Status: Abnormal   Collection Time: 02/11/21  1:13 PM  Result Value Ref Range   Opiates NONE DETECTED NONE DETECTED   Cocaine POSITIVE (A) NONE DETECTED   Benzodiazepines NONE DETECTED NONE DETECTED   Amphetamines NONE DETECTED NONE DETECTED   Tetrahydrocannabinol POSITIVE (A) NONE DETECTED   Barbiturates NONE DETECTED NONE DETECTED    Comment: (NOTE) DRUG SCREEN FOR MEDICAL PURPOSES ONLY.  IF CONFIRMATION IS NEEDED FOR ANY PURPOSE, NOTIFY LAB WITHIN 5 DAYS.  LOWEST DETECTABLE LIMITS FOR URINE DRUG SCREEN Drug Class                     Cutoff (ng/mL) Amphetamine and metabolites    1000 Barbiturate and metabolites    200 Benzodiazepine                 200 Tricyclics and metabolites     300 Opiates and metabolites        300 Cocaine and metabolites        300 THC                            50 Performed at [facility], [address] [place], [place] [address]     Physical Findings: AIMS: Facial and Oral Movements Muscles of Facial Expression: None, normal Lips and Perioral Area: None, normal Jaw: None, normal Tongue: None, normal,Extremity Movements Upper (arms, wrists, hands, fingers): None, normal Lower (legs, knees, ankles, toes): None, normal, Trunk Movements Neck, shoulders, hips: None, normal, Overall Severity Severity of abnormal movements (highest score from questions above): None, normal Incapacitation due to abnormal movements: None, normal Patient's awareness of abnormal movements (rate only patient's report): No Awareness,  Dental Status Current problems with teeth and/or dentures?: No Does patient usually wear dentures?: No   Musculoskeletal: Strength & Muscle Tone: within normal limits Gait & Station: normal Patient leans: N/A  Psychiatric Specialty Exam: Physical Exam Vitals and nursing note reviewed.  HENT:     Head: Normocephalic.  Pulmonary:     Effort: Pulmonary effort is normal.  Neurological:     General: No focal deficit present.     Mental Status: She is alert.    Review of Systems  Constitutional:  Positive for chills.  Respiratory:  Negative for shortness of breath.   Cardiovascular:  Negative for chest pain.  Gastrointestinal:  Negative for abdominal pain, constipation, diarrhea, nausea and vomiting.  Musculoskeletal:  Negative for arthralgias.  Neurological:  Negative for headaches.   Blood pressure (!) 103/49, pulse (!) 59, temperature 98.4 F (36.9 C), temperature source Oral, resp. rate 18, height 5\' 7"  (1.702 m), weight 54.4 kg, SpO2 98 %.Body mass index is 18.79 kg/m.  General Appearance:  casually dressed, adequate hygiene  Eye Contact:  Good  Speech:  Clear and Coherent and Normal Rate - no longer rapid or rambling  Volume:  Normal  Mood:  Anxious - far less expansive and grandiose appearing today  Affect:   No longer labile but appears anxious; no longer elevated  Thought Process:  Goal Directed and Linear - less tangential and circumstantial but still ruminative about discharge planning  Orientation:  Full (Time, Place, and Person)  Thought Content:  Logical and denies AVH, paranoia, or delusions  Suicidal Thoughts:  No  Homicidal Thoughts:  No  Memory:  Recent;   Good  Judgement:  Fair  Insight:  Fair  Psychomotor Activity:  Normal  Concentration:  Concentration: Fair and Attention Span: Fair  Recall:  Good  Fund of Knowledge:  Good  Language:  Good  Akathisia:  Negative  Assets:  Communication Skills Desire for Improvement Housing Resilience Social Support   ADL's:  Intact  Cognition:  WNL  Sleep:  Number of Hours: 6.5   Treatment Plan Summary: Diagnoses / Active Problems: Bipolar d/o MRE mixed Stimulant use d/o - cocaine type Cannabis use d/o Nicotine use d/o PTSD by hx R/o cluster B traits  PLAN: Safety and Monitoring:  -- Voluntary admission to inpatient psychiatric unit for safety, stabilization and treatment  -- Daily contact with patient to assess and evaluate symptoms and progress in treatment  -- Patient's case to be discussed in multi-disciplinary team meeting  -- Observation Level : q15 minute checks  -- Vital signs:  q12 hours  -- Precautions: suicide, elopement, and assault  2. Psychiatric Diagnoses and Treatment:   Bipolar d/o MRE mixed  PTSD by hx  R/O Cluster B traits -- Discontinue Lamictal due to concern for compliance with dosing -- Start Neurontin 100mg  tid titrating up as tolerated to help with anxiety and cravings -- Continue Abilify 15mg  daily for mixed manic episode/mood  -- Continue Lithium 300mg  bid for mixed manic episode/mood  - lithium level today 0.27 and TSH 1.540, creatinine 0.80; will check repeat Li level, BMP and TSH in 4 days once she has consistently been on dose -- Holding antidepressants at this time due to mixed manic presentation to make sure patient does not have further mood escalation  -- Metabolic profile and EKG monitoring obtained while on an atypical antipsychotic (Lipid Panel: WNL; HbgA1c: 4.6; )   -- Encouraged patient to participate in unit milieu and in scheduled group therapies   -- Short Term Goals: Ability to verbalize feelings will improve, Ability to disclose and discuss suicidal ideas, and Ability to identify and develop effective coping behaviors will improve  -- Long Term Goals: Improvement in symptoms so as ready for discharge   Stimulant use d/o - cocaine type Cannabis use d/o Nicotine use d/o -- UDS positive for cocaine and THC -- Continue PRN medications  for symptomatic treatment of potential withdrawal (Robaxin, Naprosyn, Vistaril, Bentyl, Zofran, Imodium, Tylenol)  -- Start Neurontin 100mg  tid for help with anxiety and cravings titrating up as tolerated -- SW to assist with outpatient SA treatment options for discharge -- Continue Nicoderm patch 21mg /24 hours for smoking cessation  -- Short Term Goals: Ability to identify triggers associated with substance abuse/mental health issues will improve  -- Long Term Goals: Improvement in symptoms so as ready for discharge   3. Medical Issues Being Addressed:   Admission labs reviewed: UPT negative; CMP WNL except for glucose 100 and total protein 6.4; CBC WNL; TSH 1.540; Li 0.27; UDS positive for cocaine and THC; Lipid panel WNL; A1c 4.6  4. Discharge Planning:   -- Social work and case management to assist with discharge planning and identification of hospital follow-up needs prior to discharge  -- Estimated LOS: 5-7 days  -- Discharge Concerns: Need to establish a safety plan; Medication compliance and effectiveness  -- Discharge Goals: Return home with outpatient referrals for mental health follow-up including medication management/psychotherapy   , MD, FAPA 02/12/2021, 7:09 AM

## 2021-02-12 NOTE — Group Note (Signed)
LCSW Group Therapy Note   Group Date: 02/12/2021 Start Time: 1300 End Time: 1330   Type of Therapy and Topic:  Group Therapy:   Therapy Type: Group Therapy  Participation Level:  Did Not Attend   Patients received a worksheet with an outline of 2 gingerbread men with a separation in the middle of the page. One sign designated what the pt sees about themselves and the other is what others see. Pts were asked to introduce themselves and share something they like about themself. Pts were then asked to draw, write or color how they view themselves as well as how they are viewed by others. CSW led discussion about the feelings and words associated with each side.   Patient Summary: Pt did not attend. Felizardo Hoffmann, LCSWA 02/12/2021  1:22 PM

## 2021-02-12 NOTE — Progress Notes (Signed)
Patient did attend the evening speaker NA meeting.  

## 2021-02-12 NOTE — BH IP Treatment Plan (Signed)
Interdisciplinary Treatment and Diagnostic Plan Update  02/12/2021 Time of Session: 9:25am  Deborah Mckee MRN: [id number]  Principal Diagnosis: Bipolar affective disorder, current episode mixed ([place])  Secondary Diagnoses: Principal Problem:   Bipolar affective disorder, current episode mixed ([place]) Active Problems:   Chronic post-traumatic stress disorder   Cannabis abuse   Cocaine abuse ([place])   Current Medications:  Current Facility-Administered Medications  Medication Dose Route Frequency Provider Last Rate Last Admin   acetaminophen (TYLENOL) tablet 650 mg  650 mg Oral Q6H PRN Niel Hummer, NP       alum & mag hydroxide-simeth (MAALOX/MYLANTA) 200-200-20 MG/5ML suspension 30 mL  30 mL Oral Q4H PRN Niel Hummer, NP       ARIPiprazole (ABILIFY) tablet 15 mg  15 mg Oral Daily Nelda Marseille, Amy E, MD   15 mg at 02/12/21 0818   dicyclomine (BENTYL) tablet 20 mg  20 mg Oral Q6H PRN Harlow Asa, MD       gabapentin (NEURONTIN) capsule 100 mg  100 mg Oral TID Viann Fish E, MD   100 mg at 02/12/21 1248   hydrOXYzine (ATARAX/VISTARIL) tablet 25 mg  25 mg Oral Q6H PRN Harlow Asa, MD       lithium carbonate (LITHOBID) CR tablet 300 mg  300 mg Oral Q12H France Ravens, MD   300 mg at 02/12/21 0820   loperamide (IMODIUM) capsule 2-4 mg  2-4 mg Oral PRN Harlow Asa, MD       magnesium hydroxide (MILK OF MAGNESIA) suspension 30 mL  30 mL Oral Daily PRN Niel Hummer, NP       methocarbamol (ROBAXIN) tablet 500 mg  500 mg Oral Q8H PRN Nelda Marseille, Amy E, MD       naproxen (NAPROSYN) tablet 500 mg  500 mg Oral BID PRN Harlow Asa, MD       nicotine (NICODERM CQ - dosed in mg/24 hours) patch 21 mg  21 mg Transdermal Daily Elmarie Shiley A, NP   21 mg at 02/12/21 0819   ondansetron (ZOFRAN-ODT) disintegrating tablet 4 mg  4 mg Oral Q6H PRN Harlow Asa, MD       traZODone (DESYREL) tablet 50 mg  50 mg Oral QHS PRN Rosezetta Schlatter, MD   50 mg at 02/11/21 2130   PTA  Medications: Medications Prior to Admission  Medication Sig Dispense Refill Last Dose   ARIPiprazole (ABILIFY) 10 MG tablet Take 10 mg by mouth daily.      escitalopram (LEXAPRO) 10 MG tablet Take 10 mg by mouth at bedtime.      lithium carbonate (ESKALITH) 450 MG CR tablet Take by mouth at bedtime.      lurasidone (LATUDA) 20 MG TABS tablet Take 20 mg by mouth at bedtime.      buPROPion (WELLBUTRIN XL) 300 MG 24 hr tablet Take 300 mg by mouth every morning.      lamoTRIgine (LAMICTAL) 200 MG tablet Take 1 tablet (200 mg total) by mouth at bedtime. 90 tablet 2    lithium carbonate 150 MG capsule Take 150 mg by mouth every morning.       Patient Stressors:    Patient Strengths:    Treatment Modalities: Medication Management, Group therapy, Case management,  1 to 1 session with clinician, Psychoeducation, Recreational therapy.   Physician Treatment Plan for Primary Diagnosis: Bipolar affective disorder, current episode mixed ([place]) Long Term Goal(s): Improvement in symptoms so as ready for discharge   Short Term Goals:  Ability to identify triggers associated with substance abuse/mental health issues will improve Ability to verbalize feelings will improve Ability to disclose and discuss suicidal ideas Ability to identify and develop effective coping behaviors will improve  Medication Management: Evaluate patient's response, side effects, and tolerance of medication regimen.  Therapeutic Interventions: 1 to 1 sessions, Unit Group sessions and Medication administration.  Evaluation of Outcomes: Not Met  Physician Treatment Plan for Secondary Diagnosis: Principal Problem:   Bipolar affective disorder, current episode mixed ([place]) Active Problems:   Chronic post-traumatic stress disorder   Cannabis abuse   Cocaine abuse ([place])  Long Term Goal(s): Improvement in symptoms so as ready for discharge   Short Term Goals: Ability to identify triggers associated with substance abuse/mental  health issues will improve Ability to verbalize feelings will improve Ability to disclose and discuss suicidal ideas Ability to identify and develop effective coping behaviors will improve     Medication Management: Evaluate patient's response, side effects, and tolerance of medication regimen.  Therapeutic Interventions: 1 to 1 sessions, Unit Group sessions and Medication administration.  Evaluation of Outcomes: Not Met   RN Treatment Plan for Primary Diagnosis: Bipolar affective disorder, current episode mixed ([place]) Long Term Goal(s): Knowledge of disease and therapeutic regimen to maintain health will improve  Short Term Goals: Ability to remain free from injury will improve, Ability to participate in decision making will improve, Ability to verbalize feelings will improve, Ability to disclose and discuss suicidal ideas, and Ability to identify and develop effective coping behaviors will improve  Medication Management: RN will administer medications as ordered by provider, will assess and evaluate patient's response and provide education to patient for prescribed medication. RN will report any adverse and/or side effects to prescribing provider.  Therapeutic Interventions: 1 on 1 counseling sessions, Psychoeducation, Medication administration, Evaluate responses to treatment, Monitor vital signs and CBGs as ordered, Perform/monitor CIWA, COWS, AIMS and Fall Risk screenings as ordered, Perform wound care treatments as ordered.  Evaluation of Outcomes: Not Met   LCSW Treatment Plan for Primary Diagnosis: Bipolar affective disorder, current episode mixed ([place]) Long Term Goal(s): Safe transition to appropriate next level of care at discharge, Engage patient in therapeutic group addressing interpersonal concerns.  Short Term Goals: Engage patient in aftercare planning with referrals and resources, Increase social support, Increase emotional regulation, Facilitate acceptance of mental health  diagnosis and concerns, Identify triggers associated with mental health/substance abuse issues, and Increase skills for wellness and recovery  Therapeutic Interventions: Assess for all discharge needs, 1 to 1 time with Social worker, Explore available resources and support systems, Assess for adequacy in community support network, Educate family and significant other(s) on suicide prevention, Complete Psychosocial Assessment, Interpersonal group therapy.  Evaluation of Outcomes: Not Met   Progress in Treatment: Attending groups: No. Participating in groups: No. Taking medication as prescribed: Yes. Toleration medication: Yes. Family/Significant other contact made: Yes, individual(s) contacted:  Mother  Patient understands diagnosis: Yes. Discussing patient identified problems/goals with staff: Yes. Medical problems stabilized or resolved: Yes. Denies suicidal/homicidal ideation: Yes. Issues/concerns per patient self-inventory: No.   New problem(s) identified: No, Describe:  None   New Short Term/Long Term Goal(s): medication stabilization, elimination of SI thoughts, development of comprehensive mental wellness plan.   Patient Goals: "To get better and to go home"   Discharge Plan or Barriers: Patient recently admitted. CSW will continue to follow and assess for appropriate referrals and possible discharge planning.   Reason for Continuation of Hospitalization: Depression Medication stabilization Suicidal ideation  Withdrawal symptoms  Estimated Length of Stay: 3 to 5 days    Scribe for Treatment Team: Carney Harder 02/12/2021 2:46 PM

## 2021-02-12 NOTE — Progress Notes (Signed)
Pt denies SI/HI/AVH and verbally agrees to approach staff if these become apparent or before harming themselves/others. Rates depression 5/10. Rates anxiety 0/10. Rates pain 0/10. Pt stated "I have been here for a really long time. My dogs are going to think I ditched them. I really miss my dogs and my gf. I am just depressed that I am here and I regret that I came here." Pts goal is to get out of bed but stated she probably wouldn't."  Pt has been in room for the majority of the day but seen interacting, smiling, laughing with other pt's when out of room. Scheduled medications administered to Pt, per MD orders. RN provided support and encouragement to Pt. Q15 min safety checks implemented and continued. Pt safe on the unit. RN will continue to monitor and intervene as needed.   02/12/21 0820  Psych Admission Type (Psych Patients Only)  Admission Status Voluntary  Psychosocial Assessment  Patient Complaints Depression;Sleep disturbance;Substance abuse  Eye Contact Fair  Facial Expression Anxious;Animated;Worried;Sad  Affect Anxious;Labile;Depressed  Speech Logical/coherent  Interaction Assertive  Motor Activity Other (Comment) (WDL)  Appearance/Hygiene Unremarkable  Behavior Characteristics Cooperative;Calm;Anxious  Mood Depressed;Anxious;Labile  Thought Process  Coherency WDL  Content WDL  Delusions None reported or observed  Perception WDL  Hallucination None reported or observed  Judgment Poor  Confusion None  Danger to Self  Current suicidal ideation? Denies  Self-Injurious Behavior No self-injurious ideation or behavior indicators observed or expressed   Agreement Not to Harm Self Yes  Description of Agreement contracts for safety verbal  Danger to Others  Danger to Others None reported or observed

## 2021-02-12 NOTE — Group Note (Signed)
Recreation Therapy Group Note   Group Topic:Stress Management  Group Date: 02/12/2021 Start Time: 0935 End Time: 0953 Facilitators: Caroll Rancher, LRT/CTRS Location: [address] Dayroom  Goal Area(s) Addresses:  Patient will actively participate in stress management techniques presented during session.  Patient will successfully identify benefit of practicing stress management post d/c.    Group Description: Guided Imagery. LRT provided education, instruction, and demonstration on practice of visualization via guided imagery. Patient was asked to participate in the technique introduced during session. Patients were given suggestions of ways to access scripts post d/c and encouraged to explore Youtube and other apps available on smartphones, tablets, and computers.   Affect/Mood: N/A   Participation Level: Did not attend    Clinical Observations/Individualized Feedback: Pt did not attend group.   Plan: Continue to engage patient in RT group sessions 2-3x/week.   Caroll Rancher, LRT/CTRS 02/12/2021 11:35 AM

## 2021-02-12 NOTE — [facility] Suicide Risk Assessment (Signed)
[facility] INPATIENT:  Family/Significant Other Suicide Prevention Education  Suicide Prevention Education:  Education Completed;  Amy Weesner (mother) [phone],  (name of family member/significant other) has been identified by the patient as the family member/significant other with whom the patient will be residing, and identified as the person(s) who will aid the patient in the event of a mental health crisis (suicidal ideations/suicide attempt).  With written consent from the patient, the family member/significant other has been provided the following suicide prevention education, prior to the and/or following the discharge of the patient.  The suicide prevention education provided includes the following: Suicide risk factors Suicide prevention and interventions National Suicide Hotline telephone number [facility] assessment telephone number [facility] Emergency Assistance 911 [facility] and/or Residential Mobile Crisis Unit telephone number  Request made of family/significant other to: Remove weapons (e.g., guns, rifles, knives), all items previously/currently identified as safety concern.   Remove drugs/medications (over-the-counter, prescriptions, illicit drugs), all items previously/currently identified as a safety concern.  The family member/significant other verbalizes understanding of the suicide prevention education information provided.  The family member/significant other agrees to remove the items of safety concern listed above.  "She has a cocaine addiction and she is not ready to accept it yet. She is not taking her medications like she is supposed to lately and then started making SI statements. She turned 20 this past Sunday and I am her only financial support. I kicked her out apx. 3 weeks ago and then she came Wednesday and she looked really rough and she said she needed help. She has been staying with her 79 year old girlfriend, Bri, and I don't know if she  uses. To my knowledge, she has access to weapons/firearms at the girlfriend's home." Ms. Loring stated that she cannot come to her home at discharge if she signs a 72 hour. Ms. Nasby states that to stay with her post discharge pt must adhere to all aftercare recommendations. Ms. Angus states she plans to take pts car post discharge as well. CSW discussed the importance of telling the pt this information.    Felizardo Hoffmann 02/12/2021, 2:00 PM

## 2021-02-13 NOTE — [facility] Group Notes (Signed)
Patient did not attend the relaxation group. 

## 2021-02-13 NOTE — Plan of Care (Signed)
Spoke with Deborah Mckee (mother) [phone].  Mother wanted to verify patient's present medications and the rationale behind them.  Mother was appreciative of the care we are providing and wanted to understand what each medication was addressing.  Mother would like a call from provider again prior to patient's discharge so that she can understand what medications patient will be on while at home.  Mother is agreeable to having patient at home under the condition that patient regularly does drug tests as well as attend [facility] IOP.  Mother does feel that patient's mood has improved and is less anxious at this time.  Discussed with mother that patient currently is not on an antidepressant because of the fact that patient has been appearing elevated mood.  Mother requesting additional information regarding available outpatient psychiatrist especially given patient's substance abuse.  -Park Pope, MD PGY1 Psychiatry Resident

## 2021-02-13 NOTE — Progress Notes (Signed)
Pt visible on the unit , pt stated she felt much better from yesterday, pt appeared happier and did not talk about signing 72 hr request for D/C.     02/13/21 0000  Psychosocial Assessment  Eye Contact Fair  Facial Expression Anxious;Animated;Worried;Sad  Affect Anxious;Labile;Depressed  Speech Logical/coherent  Interaction Assertive  Motor Activity Other (Comment) (WDL)  Appearance/Hygiene Unremarkable  Thought Process  Coherency WDL  Content WDL  Delusions None reported or observed  Perception WDL  Hallucination None reported or observed  Judgment Poor  Confusion None  Danger to Self  Current suicidal ideation? Denies  Self-Injurious Behavior No self-injurious ideation or behavior indicators observed or expressed   Agreement Not to Harm Self Yes  Description of Agreement contracts for safety verbal  Danger to Others  Danger to Others None reported or observed

## 2021-02-13 NOTE — Plan of Care (Signed)
Patient is mostly in bed but also was seen in the milieu. Cooperative and pleasant on approach. Alert and oriented x 4. Expressing remorse and guilt related to drug use behaviors. Reports that "Bipolar runs in my family..myalgias uncles have it...". Patient reports eating and sleeping fair. No sign of distress. Encouragements provided and safety maintained.

## 2021-02-13 NOTE — [facility] Counselor (Signed)
CSW placed a list of NA meetings for [place] on pt's chart.   Fredirick Lathe, LCSWA Clinicial Social Worker [employer]

## 2021-02-13 NOTE — Plan of Care (Signed)
  Problem: Education: Goal: Knowledge of disease or condition will improve Outcome: Progressing   Problem: Education: Goal: Knowledge of the prescribed therapeutic regimen will improve Outcome: Progressing   Problem: Education: Goal: Emotional status will improve Outcome: Not Progressing

## 2021-02-13 NOTE — Progress Notes (Addendum)
[facility] Medical Student Progress Note  02/13/2021 5:07 PM Deborah Mckee  MRN:  [id number]  Chief Complaint: SI and substance abuse  Deborah Mckee is a 20 y.o. female with a history of bipolar d/o and polysubstance abuse, who was initially admitted for inpatient psychiatric hospitalization on 02/10/2021 for management of SI and mixed manic episode in the context of ongoing cannabis and cocaine abuse. The patient is currently on Hospital Day 3.   Subjective: Deborah Mckee reports, "I feel a lot better today. More stable. Like a normal human being."  Chart Review from last 24 hours:  The patient's chart was reviewed and nursing notes were reviewed. The patient's case was discussed in multidisciplinary team meeting. Yesterday, she was in her room most of the day but was seen interacting, smiling, and laughing with other patients when out of the room. Her mother was given suicide prevention education but did share that the patient would have access to weapons and firearms at her girlfriend's home if she returns there. Patient did attend evening speaker NA meeting last night but has otherwise not attended group. Per MAR she was compliant with scheduled medications and received Trazodone X1 PRN for sleep.   Information Obtained Today During Patient Interview: The patient was seen and evaluated on the unit in the presence of Dr. Mason Jim and Dr. Hazle Quant. On assessment today the patient reports she is feeling better and was very eager to discuss a plan for outpatient addictions treatment. She is interested in [facility] and says her mother has agreed to let her return home if she completes SAIOP. She said she feels a little sad today, only because she wants to go home but otherwise endorses more stable mood. Her mom has come to visit her, and the patient said her mom will be able to say if the patient seems to be back at baseline. The patient states she no longer feels mood elevated or hypomanic and feels her thoughts are coming  at normal speed. Overall, the patient said she is feeling a lot better. She does not like the food here, and she said she cannot eat any of the breakfast but she tries to eat lunch and dinner. She says she slept perfectly well. She denies SI, HI, AVH, ideas of reference, or first rank symptoms.Lithium and Abilify were started after discontinuation of Lamictal on 10/25, and the patient said she has not had noted side-effects. Neurontin was also started for residual anxiety and cravings and she denies issues with anxiety, withdrawal or cravings today. She understands why antidepressant is held for now but believes she will definitely need one in the future. She is requesting a psychiatrist recommendation because she says her current psychiatrist does not listen to her anymore.   Principal Problem: Bipolar affective disorder, current episode mixed (HCC) Diagnosis: Principal Problem:   Bipolar affective disorder, current episode mixed (HCC) Active Problems:   Chronic post-traumatic stress disorder   Cannabis abuse   Cocaine abuse (HCC)  Total Time Spent in Direct Patient Care:  I personally spent 30 minutes on the unit in direct patient care. The direct patient care time included face-to-face time with the patient, reviewing the patient's chart, communicating with other professionals, and coordinating care. Greater than 50% of this time was spent in counseling or coordinating care with the patient regarding goals of hospitalization, psycho-education, and discharge planning needs.  Past Psychiatric History: see H&P  Past Medical History:  Past Medical History:  Diagnosis Date   Anxiety  Depression    Vaccine for human papilloma virus (HPV) types 6, 11, 16, and 18 administered    Vision abnormalities    wears glasses    Past Surgical History:  Procedure Laterality Date   INGUINAL HERNIA PEDIATRIC WITH LAPAROSCOPIC EXAM Left 04/22/2017   Procedure: INGUINAL HERNIA PEDIATRIC WITH LAP LOOK OF  PELVIC FLOOR;  Surgeon: Leonia Corona, MD;  Location: [facility];  Service: Pediatrics;  Laterality: Left;   INGUINAL HERNIA REPAIR Right 2008   Family History: see H&P  Family Psychiatric  History: see H&P   Social History:  Social History   Substance and Sexual Activity  Alcohol Use No     Social History   Substance and Sexual Activity  Drug Use Yes   Types: Marijuana   Comment: social    Social History   Socioeconomic History   Marital status: Single    Spouse name: Not on file   Number of children: Not on file   Years of education: Not on file   Highest education level: Not on file  Occupational History   Not on file  Tobacco Use   Smoking status: Never   Smokeless tobacco: Never  Vaping Use   Vaping Use: Every day  Substance and Sexual Activity   Alcohol use: No   Drug use: Yes    Types: Marijuana    Comment: social   Sexual activity: Yes    Birth control/protection: None    Comment: same sex partner  Other Topics Concern   Not on file  Social History Narrative   Not on file   Social Determinants of Health   Financial Resource Strain: Not on file  Food Insecurity: Not on file  Transportation Needs: Not on file  Physical Activity: Not on file  Stress: Not on file  Social Connections: Not on file   Additional Social History:    Pain Medications: SEE MAR Prescriptions: SEE MAR Over the Counter: SEE MAR History of alcohol / drug use?: Yes Withdrawal Symptoms: Irritability Name of Substance 1: Cocaine 1 - Age of First Use: 20 yrs old 1 - Amount (size/oz): 1 gram 1 - Frequency: daily 1 - Duration: on-going 1 - Last Use / Amount: Friday; "a couple of illness" 1 - Method of Aquiring: unknown 1- Route of Use: unnown    Sleep: Good  Appetite:  Fair  Current Medications: Current Facility-Administered Medications  Medication Dose Route Frequency Provider Last Rate Last Admin   acetaminophen (TYLENOL) tablet 650 mg  650 mg  Oral Q6H PRN Thermon Leyland, NP       alum & mag hydroxide-simeth (MAALOX/MYLANTA) 200-200-20 MG/5ML suspension 30 mL  30 mL Oral Q4H PRN Fransisca Kaufmann A, NP       ARIPiprazole (ABILIFY) tablet 15 mg  15 mg Oral Daily Mason Jim, Ramond Darnell E, MD   15 mg at 02/13/21 0919   dicyclomine (BENTYL) tablet 20 mg  20 mg Oral Q6H PRN Mason Jim, Nelida Mandarino E, MD       gabapentin (NEURONTIN) capsule 100 mg  100 mg Oral TID Mason Jim, Geneve Kimpel E, MD   100 mg at 02/13/21 1322   hydrOXYzine (ATARAX/VISTARIL) tablet 25 mg  25 mg Oral Q6H PRN Bartholomew Crews E, MD   25 mg at 02/12/21 2105   lithium carbonate (LITHOBID) CR tablet 300 mg  300 mg Oral Q12H Park Pope, MD   300 mg at 02/13/21 0919   loperamide (IMODIUM) capsule 2-4 mg  2-4 mg Oral PRN Bartholomew Crews  E, MD       magnesium hydroxide (MILK OF MAGNESIA) suspension 30 mL  30 mL Oral Daily PRN Fransisca Kaufmann A, NP       methocarbamol (ROBAXIN) tablet 500 mg  500 mg Oral Q8H PRN Mason Jim, Jameah Rouser E, MD       naproxen (NAPROSYN) tablet 500 mg  500 mg Oral BID PRN Comer Locket, MD       nicotine (NICODERM CQ - dosed in mg/24 hours) patch 21 mg  21 mg Transdermal Daily Fransisca Kaufmann A, NP   21 mg at 02/13/21 0921   ondansetron (ZOFRAN-ODT) disintegrating tablet 4 mg  4 mg Oral Q6H PRN Comer Locket, MD       traZODone (DESYREL) tablet 50 mg  50 mg Oral QHS PRN Lamar Sprinkles, MD   50 mg at 02/12/21 2105    Lab Results:  Results for orders placed or performed during the hospital encounter of 02/10/21 (from the past 48 hour(s))  CBC     Status: None   Collection Time: 02/12/21  6:39 AM  Result Value Ref Range   WBC 4.8 4.0 - 10.5 K/uL   RBC 4.62 3.87 - 5.11 MIL/uL   Hemoglobin 13.7 12.0 - 15.0 g/dL   HCT 76.1 95.0 - 93.2 %   MCV 90.7 80.0 - 100.0 fL   MCH 29.7 26.0 - 34.0 pg   MCHC 32.7 30.0 - 36.0 g/dL   RDW 67.1 24.5 - 80.9 %   Platelets 173 150 - 400 K/uL   nRBC 0.0 0.0 - 0.2 %    Comment: Performed at [facility], [address],  [place], [place] [address]  Comprehensive metabolic panel     Status: Abnormal   Collection Time: 02/12/21  6:39 AM  Result Value Ref Range   Sodium 139 135 - 145 mmol/L   Potassium 4.2 3.5 - 5.1 mmol/L   Chloride 105 98 - 111 mmol/L   CO2 28 22 - 32 mmol/L   Glucose, Bld 100 (H) 70 - 99 mg/dL    Comment: Glucose reference range applies only to samples taken after fasting for at least 8 hours.   BUN 8 6 - 20 mg/dL   Creatinine, Ser 2.50 0.44 - 1.00 mg/dL   Calcium 9.0 8.9 - 53.9 mg/dL   Total Protein 6.4 (L) 6.5 - 8.1 g/dL   Albumin 4.0 3.5 - 5.0 g/dL   AST 19 15 - 41 U/L   ALT 44 0 - 44 U/L   Alkaline Phosphatase 53 38 - 126 U/L   Total Bilirubin 0.7 0.3 - 1.2 mg/dL   GFR, Estimated >76 >73 mL/min    Comment: (NOTE) Calculated using the CKD-EPI Creatinine Equation (2021)    Anion gap 6 5 - 15    Comment: Performed at [facility], [address] [place], [place] [address]  Lithium level     Status: Abnormal   Collection Time: 02/12/21  6:39 AM  Result Value Ref Range   Lithium Lvl 0.27 (L) 0.60 - 1.20 mmol/L    Comment: Performed at [facility], [address], [place], [place] [address]  TSH     Status: None   Collection Time: 02/12/21  6:39 AM  Result Value Ref Range   TSH 1.540 0.350 - 4.500 uIU/mL    Comment: Performed by a 3rd Generation assay with a functional sensitivity of <=0.01 uIU/mL. Performed at [facility], [address] [place],  [place] [address]   Hemoglobin A1c     Status: Abnormal   Collection Time: 02/12/21  6:39 AM  Result Value Ref Range   Hgb A1c MFr Bld 4.6 (L) 4.8 - 5.6 %    Comment: (NOTE) Pre diabetes:          5.7%-6.4%  Diabetes:              >6.4%  Glycemic control for   <7.0% adults with diabetes    Mean Plasma Glucose 85.32 mg/dL    Comment: Performed at [facility], [address] [place], [place] [address]  Lipid panel     Status: None   Collection Time: 02/12/21  6:39  AM  Result Value Ref Range   Cholesterol 140 0 - 200 mg/dL   Triglycerides 27 <009 mg/dL   HDL 59 >38 mg/dL   Total CHOL/HDL Ratio 2.4 RATIO   VLDL 5 0 - 40 mg/dL   LDL Cholesterol 76 0 - 99 mg/dL    Comment:        Total Cholesterol/HDL:CHD Risk Coronary Heart Disease Risk Table                     Men   Women  1/2 Average Risk   3.4   3.3  Average Risk       5.0   4.4  2 X Average Risk   9.6   7.1  3 X Average Risk  23.4   11.0        Use the calculated Patient Ratio above and the CHD Risk Table to determine the patient's CHD Risk.        ATP III CLASSIFICATION (LDL):  <100     mg/dL   Optimal  182-993  mg/dL   Near or Above                    Optimal  130-159  mg/dL   Borderline  716-967  mg/dL   High  >893     mg/dL   Very High Performed at [facility], [address] [place], [place] [address]     Physical Findings: AIMS: Facial and Oral Movements Muscles of Facial Expression: None, normal Lips and Perioral Area: None, normal Jaw: None, normal Tongue: None, normal,Extremity Movements Upper (arms, wrists, hands, fingers): None, normal Lower (legs, knees, ankles, toes): None, normal, Trunk Movements Neck, shoulders, hips: None, normal, Overall Severity Severity of abnormal movements (highest score from questions above): None, normal Incapacitation due to abnormal movements: None, normal Patient's awareness of abnormal movements (rate only patient's report): No Awareness, Dental Status Current problems with teeth and/or dentures?: No Does patient usually wear dentures?: No   Musculoskeletal: Strength & Muscle Tone: within normal limits Gait & Station: normal Patient leans: N/A  Psychiatric Specialty Exam: Physical Exam Vitals and nursing note reviewed.  HENT:     Head: Normocephalic.  Pulmonary:     Effort: Pulmonary effort is normal.  Neurological:     General: No focal deficit present.     Mental Status: She is alert.    Review  of Systems  Constitutional: Negative.  Negative for chills.  Respiratory:  Negative for shortness of breath.   Cardiovascular:  Negative for chest pain.  Gastrointestinal:  Negative for abdominal pain, constipation, diarrhea, nausea and vomiting.  Musculoskeletal:  Negative for arthralgias.  Neurological:  Negative for headaches.   Blood pressure (!) 84/48, pulse 67, temperature 98.3 F (  36.8 C), temperature source Oral, resp. rate 18, height  (1.702 m), weight 54.4 kg, SpO2 99 %.Body mass index is 18.79 kg/m.  General Appearance:  casually dressed, adequate hygiene  Eye Contact:  Good  Speech:  Clear and Coherent and Normal Rate  Volume:  Normal  Mood:  described as improved - appears more euthymic and less elevated  Affect:  no longer expansive or grandiose; appears calmer and no longer labile  Thought Process:  Goal Directed and Linear  Orientation:  Full (Time, Place, and Person)  Thought Content:  Logical and denies AVH, paranoia, or delusions  Suicidal Thoughts:  No  Homicidal Thoughts:  No  Memory:  Recent;   Good  Judgement:  Fair  Insight:  Fair  Psychomotor Activity:  Normal, no cogwheeling, no tremor, no stiffness with AIMS 0  Concentration:  Concentration: Fair and Attention Span: Fair  Recall:  Good  Fund of Knowledge:  Good  Language:  Good  Akathisia:  Negative  Assets:  Communication Skills Desire for Improvement Housing Resilience Social Support  ADL's:  Intact  Cognition:  WNL  Sleep:  Good   Treatment Plan Summary: Diagnoses / Active Problems: Bipolar d/o MRE mixed Stimulant use d/o - cocaine type Cannabis use d/o Nicotine use d/o PTSD by hx R/o cluster B traits  PLAN: Safety and Monitoring:  -- Voluntary admission to inpatient psychiatric unit for safety, stabilization and treatment  -- Daily contact with patient to assess and evaluate symptoms and progress in treatment  -- Patient's case to be discussed in multi-disciplinary team  meeting  -- Observation Level : q15 minute checks  -- Vital signs:  q12 hours  -- Precautions: suicide, elopement, and assault  2. Psychiatric Diagnoses and Treatment:   Bipolar d/o MRE mixed  PTSD by hx  R/O Cluster B traits -- Continue Neurontin  tid,to help with anxiety and cravings -- Continue Abilify  daily for mixed manic episode/mood (Lipids WNL, A1c 4.6, QTC pending) -- Continue Lithium  bid for mixed manic episode/mood  - lithium level today 0.27 and TSH 1.540, creatinine 0.80; will check repeat Li level, BMP Saturday evening -- Holding antidepressants at this time due to mixed manic presentation to make sure patient does not have further mood escalation - discussed potential restart as an outpatient  -- Metabolic profile monitoring and EKG tomorrow while on an atypical antipsychotic   -- Short Term Goals: Ability to verbalize feelings will improve, Ability to disclose and discuss suicidal ideas, and Ability to identify and develop effective coping behaviors will improve  -- Long Term Goals: Improvement in symptoms so as ready for discharge   Stimulant use d/o - cocaine type Cannabis use d/o Nicotine use d/o -- UDS positive for cocaine and THC -- Continue PRN medications for symptomatic treatment of potential withdrawal (Robaxin, Naprosyn, Vistaril, Bentyl, Zofran, Imodium, Tylenol)  -- Continue Neurontin  tid, titrating up as tolerated to help with anxiety and cravings -- SW to assist with outpatient SAIOP options  -- Continue Nicoderm patch /24 hours for smoking cessation  -- Short Term Goals: Ability to identify triggers associated with substance abuse/mental health issues will improve  -- Long Term Goals: Improvement in symptoms so as ready for discharge   3. Medical Issues Being Addressed:   Admission labs reviewed: UPT negative; CMP WNL except for glucose 100 and total protein 6.4; CBC WNL; TSH 1.540; Li 0.27; UDS positive for cocaine and THC; Lipid  panel WNL; A1c 4.6  4. Discharge Planning:   --  Social work and case management to assist with discharge planning and identification of hospital follow-up needs prior to discharge  -- Estimated LOS: 5-7 days  -- Discharge Concerns: Need to establish a safety plan; Medication compliance and effectiveness  -- Discharge Goals: Return home with outpatient referrals for mental health follow-up including medication management/psychotherapy   Deborah Burly MS-3 02/13/2021, 5:07 PM  Attestation for Student Documentation:  I certify that I saw and interviewed the patient together with the medical student and was present for the duration of the interview.  I reviewed the medical record.  I performed or reperformed the mental status examination of the patient as indicated.  I formulated the assessment and plan of treatment as documented above. Bartholomew Crews, MD, [facility] Skeen

## 2021-02-13 NOTE — Progress Notes (Signed)
   Pt presents with no complaints.  Pt states, "just ready to go home."  Pt also reports, "I'm about as happy as I can be being I am in a mental institution."  Pt denies SI/HI and verbally contracts for safety.  Pt denies AVH.  Medication administered.  Pt is safe on unit with Q 15 minute safety checks.    02/13/21 2052  Psych Admission Type (Psych Patients Only)  Admission Status Voluntary  Psychosocial Assessment  Patient Complaints Other (Comment) ("just ready to go home")  Eye Contact Fair  Facial Expression Animated  Affect Anxious  Speech Logical/coherent  Interaction Assertive  Motor Activity Other (Comment) (wnl)  Appearance/Hygiene Unremarkable  Behavior Characteristics Cooperative  Mood Anxious  Thought Process  Coherency WDL  Content WDL  Delusions None reported or observed  Perception WDL  Hallucination None reported or observed  Judgment Poor  Confusion None  Danger to Self  Current suicidal ideation? Denies  Self-Injurious Behavior No self-injurious ideation or behavior indicators observed or expressed   Agreement Not to Harm Self Yes  Description of Agreement verbal contract for safety  Danger to Others  Danger to Others None reported or observed

## 2021-02-13 NOTE — Progress Notes (Signed)
[facility] Group Notes:  (Nursing/MHT/Case Management/Adjunct)  Date:  02/13/2021  Time:  2015  Type of Therapy:   wrap up group  Participation Level:  Active  Participation Quality:  Appropriate, Attentive, Sharing, and Supportive  Affect:  Appropriate  Cognitive:  Alert  Insight:  Improving  Engagement in Group:  Engaged  Modes of Intervention:  Clarification, Education, and Support  Summary of Progress/Problems: Positive thinking and positive change were discussed.   Deborah Mckee 02/13/2021, 10:29 PM

## 2021-02-14 MED ORDER — ARIPIPRAZOLE 10 MG PO TABS
20.0000 mg | ORAL_TABLET | Freq: Every day | ORAL | Status: DC
  Administered 2021-02-15 – 2021-02-17 (×3): 20 mg via ORAL
  Filled 2021-02-14 (×5): qty 2

## 2021-02-14 MED ORDER — ARIPIPRAZOLE 5 MG PO TABS
5.0000 mg | ORAL_TABLET | Freq: Once | ORAL | Status: DC
  Filled 2021-02-14: qty 1

## 2021-02-14 NOTE — Plan of Care (Signed)
  Problem: Education: Goal: Emotional status will improve Outcome: Progressing   Problem: Education: Goal: Understanding of discharge needs will improve Outcome: Progressing   Problem: Education: Goal: Knowledge of disease or condition will improve Outcome: Not Progressing
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
## 2021-02-14 NOTE — Progress Notes (Signed)
  Pt presents with no complaints today.  Pt reports that they have diarrhea  X    3  today.  Pt administered PRN Loperamide.  Pt denies SI/HI and verbally contracts for safety.  Pt denies AVH.  Pt remains safe on unit with Q 15 checks.    02/14/21 2108  Psych Admission Type (Psych Patients Only)  Admission Status Voluntary  Psychosocial Assessment  Patient Complaints Other (Comment) (diarrhea)  Eye Contact Fair  Facial Expression Animated  Affect Anxious  Speech Logical/coherent  Interaction Assertive  Motor Activity Other (Comment) (wnl)  Appearance/Hygiene Unremarkable  Behavior Characteristics Anxious  Mood Pleasant;Anxious  Thought Process  Coherency WDL  Content WDL  Delusions None reported or observed  Perception WDL  Hallucination None reported or observed  Judgment Poor  Danger to Self  Current suicidal ideation? Denies  Self-Injurious Behavior No self-injurious ideation or behavior indicators observed or expressed   Agreement Not to Harm Self Yes  Description of Agreement verbal contract for safety  Danger to Others  Danger to Others None reported or observed

## 2021-02-14 NOTE — Progress Notes (Signed)
Pt required multiple prompts to get her scheduled morning medications this shift. Observed with irritability on initial interactions but brightened up as shift progressed. Received PRN Imodium 4 mg and Zofran 4 mg PO for active withdrawal symptoms (nausea / vomiting and diarrhea X2). Pt reported relief when  assessed. Denies SI, HI, AVh and pain when assessed. Tolerated lunch poorly due to emesis and dinner fairly "I don't want to vomit again". Declined scheduled Neurontin this shift "I'm getting high off the Neurontin so I don't want it, since I'm here for coke. I talked to the doctor about it". Emotional support and reassurance provided to pt this shift. Safety checks maintained at Q 15 minutes intervals without without self harm gestures or outburst to note thus far. Writer encouraged pt to voice concerns.  Pt remains safe on unit. Pt attended noon relaxation group.

## 2021-02-14 NOTE — BHH Counselor (Signed)
Mother called and stated she wanted to talk to MD to discuss pt's behaviors at visitation lastnight. CSW notified MD.   Fredirick Lathe, LCSWA Clinicial Social Worker [facility]

## 2021-02-14 NOTE — [facility] Group Notes (Signed)
Pt attended Relaxation Group.  

## 2021-02-14 NOTE — [facility] Group Notes (Signed)
Pt did not attend morning goals group. 

## 2021-02-14 NOTE — Plan of Care (Signed)
Spoke with patient's mom, Amy Swendsen.  Mother expressed concerns that patient was still manic at this time.  Mother describes that patient is still somewhat labile as well as unable to recall simple things such as what she had for lunch or breakfast that day.  Mother does state that she does believe that patient is less manic than before.  Discussed with mother that we were going to increase her Abilify and maintain her lithium at this point in time.  We were also planning to discontinue gabapentin.  Mother supports medication plan and is still agreeable for Monday discharge assuming that patient is continuing to progress.  All questions were addressed and mother had no additional concerns at this point in time.  Park Pope, MD PGY1 Psychiatry Resident

## 2021-02-14 NOTE — Group Note (Addendum)
LCSW Group Therapy Note   Group Date: 02/14/2021 Start Time: 1300 End Time: 1400   Type of Therapy and Topic:  Group Therapy:   Participation Level:  Active  Topic: Coping Skills  Due to the acuity and complex discharge plans, group was not held. Patient was provided therapeutic worksheets and asked to meet with CSW as needed.  Felizardo Hoffmann, LCSWA 02/14/2021  2:49 PM

## 2021-02-14 NOTE — B[facility] Group Notes (Signed)
Patient did not attend group.

## 2021-02-15 NOTE — [facility] Group Notes (Signed)
.  Psychoeducational Group Note  Date: 02/13/2021 Time: 0900-1000    Goal Setting   Purpose of Group: This group helps to provide patients with the steps of setting a goal that is specific, measurable, attainable, realistic and time specific. A discussion on how we keep ourselves stuck with negative self talk.    Participation Level:  did not attend   Dione Housekeeper

## 2021-02-15 NOTE — Progress Notes (Signed)
[facility] Group Notes:  (Nursing/MHT/Case Management/Adjunct)  Date:  02/15/2021  Time:  11:52 PM  Type of Therapy:  Group Therapy  Participation Level:  Active  Participation Quality:  Appropriate, Attentive, Sharing, and Supportive  Affect:  Appropriate  Cognitive:  Alert and Appropriate  Insight:  Appropriate and Good  Engagement in Group:  Engaged and Supportive  Modes of Intervention:  Discussion  Summary of Progress/Problems: Stated her goal was to "smile and have a good day". Stated she met her goal and met new friends.  Wynonia Hazard Laverne 02/15/2021, 11:52 PM

## 2021-02-15 NOTE — Progress Notes (Signed)
D: Patient is pleasant with staff. She denies any physical issues today. She denies any withdrawal symptoms. Patient denies any hopelessness or thoughts of self harm. She is interacting well with her peers and is participating in group activities.  A: Continue to monitor medication management and MD orders.  Safety checks completed every 15 minutes per protocol.  Offer support and encouragement as needed.  R: Patient is receptive to staff; her behavior is appropriate.     02/15/21 1200  Psych Admission Type (Psych Patients Only)  Admission Status Voluntary  Psychosocial Assessment  Patient Complaints None  Eye Contact Fair  Facial Expression Animated  Affect Anxious  Speech Logical/coherent  Interaction Assertive  Motor Activity Other (Comment) (wnl)  Appearance/Hygiene Unremarkable  Behavior Characteristics Anxious  Mood Pleasant  Thought Process  Coherency WDL  Content WDL  Delusions None reported or observed  Perception WDL  Hallucination None reported or observed  Judgment Poor  Confusion None  Danger to Self  Current suicidal ideation? Denies  Danger to Others  Danger to Others None reported or observed

## 2021-02-15 NOTE — Progress Notes (Signed)
Pt did not attend Orientation/Goals group with Chris RN. 

## 2021-02-15 NOTE — Progress Notes (Addendum)
[facility] MD Progress Note  02/15/2021 7:24 AM Deborah Mckee  MRN:  [id number]  Chief Complaint: SI and substance abuse  Deborah Mckee is a 20 y.o. female with a history of bipolar d/o and polysubstance abuse, who was initially admitted for inpatient psychiatric hospitalization on 02/10/2021 for management of SI and mixed manic episode in the context of ongoing cannabis and cocaine abuse. The patient is currently on Hospital Day 5.   Chart Review, 24 hr Events: The patient's chart was reviewed and nursing notes were reviewed. The patient's case was discussed in multidisciplinary team meeting. Per nursing, patient reported having 3 episodes of diarrhea and nausea and did receive imodium PRN and zofran PRN. She had no behavioral or safety concerns noted. She attended groups. Per MAR she was compliant with scheduled medications and did receive Trazodone X1 for sleep.  Information Obtained Today During Patient Interview: On interview this morning, patient states she had 3 episodes of diarrhea and 1 episode of emesis yesterday but has no GI symptoms this morning or overnight. We discussed her medications and she states she was on this same dose of Lithium in the past and does not believe GI issues are related to Lithium. We discussed that this could be withdrawal. She denies any current cravings and continues to express desire for [facility] after discharge. She denies AVH, paranoia, or delusions. She states she feels better and "less masked" in term of her personality since stopping Neurontin. She is due for Abilify increase today and states she does not feel manic or hypomanic today. She denies grandiosity or racing thoughts. She states she slept well and is hungry today. She denies signs of TD/EPS. She denies issues with anxiety.   Principal Problem: Bipolar affective disorder, current episode mixed (HCC) Diagnosis: Principal Problem:   Bipolar affective disorder, current episode mixed (HCC) Active Problems:    Chronic post-traumatic stress disorder   Cannabis abuse   Cocaine abuse (HCC)  Total Time Spent in Direct Patient Care:  I personally spent 25 minutes on the unit in direct patient care. The direct patient care time included face-to-face time with the patient, reviewing the patient's chart, communicating with other professionals, and coordinating care. Greater than 50% of this time was spent in counseling or coordinating care with the patient regarding goals of hospitalization, psycho-education, and discharge planning needs.  Past Psychiatric History: see H&P  Past Medical History:  Past Medical History:  Diagnosis Date   Anxiety    Depression    Vaccine for human papilloma virus (HPV) types 6, 11, 16, and 18 administered    Vision abnormalities    wears glasses    Past Surgical History:  Procedure Laterality Date   INGUINAL HERNIA PEDIATRIC WITH LAPAROSCOPIC EXAM Left 04/22/2017   Procedure: INGUINAL HERNIA PEDIATRIC WITH LAP LOOK OF PELVIC FLOOR;  Surgeon: Leonia Corona, MD;  Location: [facility];  Service: Pediatrics;  Laterality: Left;   INGUINAL HERNIA REPAIR Right 2008   Family History: see H&P  Family Psychiatric  History: see H&P   Social History:  Social History   Substance and Sexual Activity  Alcohol Use No     Social History   Substance and Sexual Activity  Drug Use Yes   Types: Marijuana   Comment: social    Social History   Socioeconomic History   Marital status: Single    Spouse name: Not on file   Number of children: Not on file   Years of education: Not on file  Highest education level: Not on file  Occupational History   Not on file  Tobacco Use   Smoking status: Never   Smokeless tobacco: Never  Vaping Use   Vaping Use: Every day  Substance and Sexual Activity   Alcohol use: No   Drug use: Yes    Types: Marijuana    Comment: social   Sexual activity: Yes    Birth control/protection: None    Comment: same sex partner   Other Topics Concern   Not on file  Social History Narrative   Not on file   Social Determinants of Health   Financial Resource Strain: Not on file  Food Insecurity: Not on file  Transportation Needs: Not on file  Physical Activity: Not on file  Stress: Not on file  Social Connections: Not on file   Additional Social History:    Pain Medications: SEE MAR Prescriptions: SEE MAR Over the Counter: SEE MAR History of alcohol / drug use?: Yes Withdrawal Symptoms: Irritability Name of Substance 1: Cocaine 1 - Age of First Use: 20 yrs old 1 - Amount (size/oz): 1 gram 1 - Frequency: daily 1 - Duration: on-going 1 - Last Use / Amount: Friday; "a couple of illness" 1 - Method of Aquiring: unknown 1- Route of Use: unnown    Sleep: Good  Appetite:  Fair  Current Medications: Current Facility-Administered Medications  Medication Dose Route Frequency Provider Last Rate Last Admin   acetaminophen (TYLENOL) tablet 650 mg  650 mg Oral Q6H PRN Thermon Leyland, NP       alum & mag hydroxide-simeth (MAALOX/MYLANTA) 200-200-20 MG/5ML suspension 30 mL  30 mL Oral Q4H PRN Thermon Leyland, NP       ARIPiprazole (ABILIFY) tablet 20 mg  20 mg Oral Daily Park Pope, MD       dicyclomine (BENTYL) tablet 20 mg  20 mg Oral Q6H PRN Comer Locket, MD       hydrOXYzine (ATARAX/VISTARIL) tablet 25 mg  25 mg Oral Q6H PRN Comer Locket, MD   25 mg at 02/12/21 2105   lithium carbonate (LITHOBID) CR tablet 300 mg  300 mg Oral Q12H Park Pope, MD   300 mg at 02/14/21 2104   loperamide (IMODIUM) capsule 2-4 mg  2-4 mg Oral PRN Comer Locket, MD   2 mg at 02/14/21 2106   magnesium hydroxide (MILK OF MAGNESIA) suspension 30 mL  30 mL Oral Daily PRN Thermon Leyland, NP       methocarbamol (ROBAXIN) tablet 500 mg  500 mg Oral Q8H PRN Mason Jim, Coriana Angello E, MD       naproxen (NAPROSYN) tablet 500 mg  500 mg Oral BID PRN Comer Locket, MD       nicotine (NICODERM CQ - dosed in mg/24 hours) patch 21 mg  21 mg  Transdermal Daily Fransisca Kaufmann A, NP   21 mg at 02/14/21 1024   ondansetron (ZOFRAN-ODT) disintegrating tablet 4 mg  4 mg Oral Q6H PRN Comer Locket, MD   4 mg at 02/14/21 1341   traZODone (DESYREL) tablet 50 mg  50 mg Oral QHS PRN Lamar Sprinkles, MD   50 mg at 02/14/21 2104    Lab Results:  No results found for this or any previous visit (from the past 48 hour(s)).   Physical Findings: AIMS: Facial and Oral Movements Muscles of Facial Expression: None, normal Lips and Perioral Area: None, normal Jaw: None, normal Tongue: None, normal,Extremity Movements Upper (arms, wrists, hands,  fingers): None, normal Lower (legs, knees, ankles, toes): None, normal, Trunk Movements Neck, shoulders, hips: None, normal, Overall Severity Severity of abnormal movements (highest score from questions above): None, normal Incapacitation due to abnormal movements: None, normal Patient's awareness of abnormal movements (rate only patient's report): No Awareness, Dental Status Current problems with teeth and/or dentures?: No Does patient usually wear dentures?: No   Musculoskeletal: Strength & Muscle Tone: within normal limits Gait & Station: normal Patient leans: N/A  Psychiatric Specialty Exam: Physical Exam Vitals and nursing note reviewed.  HENT:     Head: Normocephalic.  Pulmonary:     Effort: Pulmonary effort is normal.  Neurological:     General: No focal deficit present.     Mental Status: She is alert.    Review of Systems  Constitutional:  Negative for chills.  Respiratory:  Negative for shortness of breath.   Cardiovascular:  Negative for chest pain.  Gastrointestinal:  Positive for diarrhea, nausea and vomiting. Negative for abdominal pain and constipation.  Musculoskeletal:  Negative for arthralgias.  Neurological:  Negative for headaches.   Blood pressure 118/76, pulse 95, temperature (!) 97.2 F (36.2 C), resp. rate 18, height 5\' 7"  (1.702 m), weight 54.4 kg, SpO2 100  %.Body mass index is 18.79 kg/m.  General Appearance:  casually dressed, adequate hygiene  Eye Contact:  Fair  Speech:  Clear and Coherent and Normal Rate - no longer rapid or rambling  Volume:  Normal  Mood:  described as improving - appears more euthymic   Affect:  less labile today; no elevation noted; calm and polite  Thought Process:  Goal Directed and Linear  Orientation:  oriented to self, month, year and President  Thought Content:  Logical and denies AVH, paranoia, or delusions  Suicidal Thoughts:  No  Homicidal Thoughts:  No  Memory:  Recent;   Good  Judgement:  Fair  Insight:  Fair  Psychomotor Activity:  Normal  Concentration:  Improved  Recall:  Good  Fund of Knowledge:  Good  Language:  Good  Akathisia:  Negative  Assets:  Communication Skills Desire for Improvement Housing Resilience Social Support  ADL's:  Intact  Cognition:  WNL  Sleep:  Number of Hours: 6.75   Treatment Plan Summary: Diagnoses / Active Problems: Bipolar d/o MRE mixed Stimulant use d/o - cocaine type Cannabis use d/o Nicotine use d/o PTSD by hx R/o cluster B traits  PLAN: Safety and Monitoring:  -- Voluntary admission to inpatient psychiatric unit for safety, stabilization and treatment  -- Daily contact with patient to assess and evaluate symptoms and progress in treatment  -- Patient's case to be discussed in multi-disciplinary team meeting  -- Observation Level : q15 minute checks  -- Vital signs:  q12 hours  -- Precautions: suicide, elopement, and assault  2. Psychiatric Diagnoses and Treatment:   Bipolar d/o MRE mixed  PTSD by hx  R/O Cluster B traits -- Discontinue Lamictal due to concern for compliance with dosing -- Discontinue Neurontin 100mg  tid at patient's request -- Increase Abilify 20 mg daily starting today for mixed manic episode/mood and recheck EKG today for QTC monitoring with dose change -- Continue Lithium 300mg  bid for mixed manic episode/mood  - lithium  level today 0.27 and TSH 1.540, creatinine 0.80; will check repeat Li level, BMP and TSH tomorrow morning -- Holding antidepressants at this time due to mixed manic presentation to make sure patient does not have further mood escalation  -- Metabolic profile and EKG monitoring obtained  while on an atypical antipsychotic (Lipid Panel: WNL; HbgA1c: 4.6; QTc: 459)   -- Encouraged patient to participate in unit milieu and in scheduled group therapies   -- Short Term Goals: Ability to verbalize feelings will improve, Ability to disclose and discuss suicidal ideas, and Ability to identify and develop effective coping behaviors will improve  -- Long Term Goals: Improvement in symptoms so as ready for discharge   Stimulant use d/o - cocaine type Cannabis use d/o Nicotine use d/o -- UDS positive for cocaine and THC -- Continue PRN medications for symptomatic treatment of potential withdrawal (Robaxin, Naprosyn, Vistaril, Bentyl, Zofran, Imodium, Tylenol) Will check COWS scores in the event she had been using opiates prior to admission given GI sx at this time -- Discontinued Neurontin at patient's request -- SW to assist with outpatient SAIOP treatment options for discharge -- Continue Nicoderm patch 21mg /24 hours for smoking cessation  -- Short Term Goals: Ability to identify triggers associated with substance abuse/mental health issues will improve  -- Long Term Goals: Improvement in symptoms so as ready for discharge   3. Medical Issues Being Addressed:   Vomiting and Diarrhea:  -- Questionably due to withdrawal - will continue to monitor and patient has PRNs for Zofran and Imodium - she states symptoms have resolved and reports being hungry this morning and wanting breakfast tray; denies abdominal pain; afebrile; pushing po fluids  4. Discharge Planning:   -- Social work and case management to assist with discharge planning and identification of hospital follow-up needs prior to discharge  --  Estimated LOS: 7-10 days  -- Discharge Concerns: Need to establish a safety plan; Medication compliance and effectiveness  -- Discharge Goals: Return home with outpatient referrals for mental health follow-up including medication management/psychotherapy   10-07-1983, MD 02/15/2021, 7:24 AM

## 2021-02-15 NOTE — [facility] Group Notes (Signed)
Psychoeducational Group Note  Date: 02-15-21 Time:  1300  Group Topic/Focus:  Making Healthy Choices:   The focus of this group is to help patients identify negative/unhealthy choices they were using prior to admission and identify positive/healthier coping strategies to replace them upon discharge.In this group.   Participation Level:  Active  Participation Quality:  Appropriate  Affect:  Appropriate  Cognitive:  Oriented  Insight:  Improving  Engagement in Group:  Engaged  Additional Comments:. Rates her energy at a 4/10  Deborah Mckee A

## 2021-02-15 NOTE — Progress Notes (Signed)
[facility] Group Notes:  (Nursing/MHT/Case Management/Adjunct)  Date:  02/15/2021  Time:  2:23 AM  Type of Therapy:   AA  Participation Level:  Active  Participation Quality:  Appropriate, Attentive, Sharing, and Supportive  Affect:  Appropriate  Cognitive:  Alert, Appropriate, and Oriented  Insight:  Appropriate and Good  Engagement in Group:  Engaged and Supportive  Modes of Intervention:  Discussion  Summary of Progress/Problems: Pt participated appropriately in group when prompted by the group facilitators.   Fransico Michael Laverne 02/15/2021, 2:23 AM

## 2021-02-15 NOTE — Group Note (Signed)
LCSW Group Therapy Note  Group Date: 02/15/2021 Start Time: 1005 End Time: 1105   Type of Therapy and Topic:  Group Therapy: Anger Cues and Responses  Participation Level:  Did Not Attend   Description of Group:   In this group, patients learned how to recognize the physical, cognitive, emotional, and behavioral responses they have to anger-provoking situations.  They identified a recent time they became angry and how they reacted.  They analyzed how their reaction was possibly beneficial and how it was possibly unhelpful.  The group discussed a variety of healthier coping skills that could help with such a situation in the future.  Focus was placed on how helpful it is to recognize the underlying emotions to our anger, because working on those can lead to a more permanent solution as well as our ability to focus on the important rather than the urgent.  Therapeutic Goals: Patients will remember their last incident of anger and how they felt emotionally and physically, what their thoughts were at the time, and how they behaved. Patients will identify how their behavior at that time worked for them, as well as how it worked against them. Patients will explore possible new behaviors to use in future anger situations. Patients will learn that anger itself is normal and cannot be eliminated, and that healthier reactions can assist with resolving conflict rather than worsening situations.  Summary of Patient Progress:  [place] was invited to group, did not attend.  Therapeutic Modalities:   Cognitive Behavioral Therapy    Lynnell Chad, LCSWA 02/15/2021  11:13 AM

## 2021-02-16 LAB — BASIC METABOLIC PANEL
Anion gap: 4 — ABNORMAL LOW (ref 5–15)
BUN: 6 mg/dL (ref 6–20)
CO2: 28 mmol/L (ref 22–32)
Calcium: 8.9 mg/dL (ref 8.9–10.3)
Chloride: 107 mmol/L (ref 98–111)
Creatinine, Ser: 0.87 mg/dL (ref 0.44–1.00)
GFR, Estimated: >60 mL/min (ref >60)
Glucose, Bld: 69 mg/dL — ABNORMAL LOW (ref 70–99)
Potassium: 3.5 mmol/L (ref 3.5–5.1)
Sodium: 139 mmol/L (ref 135–145)

## 2021-02-16 LAB — TSH: TSH: 1.842 u[IU]/mL (ref 0.350–4.500)

## 2021-02-16 LAB — LITHIUM LEVEL: Lithium Lvl: 0.65 mmol/L (ref 0.60–1.20)

## 2021-02-16 NOTE — Group Note (Signed)
LCSW Group Therapy Note   Group Date: 02/16/2021 Start Time: 1000 End Time: 1100   Type of Therapy and Topic:  Group Therapy: Boundaries  Participation Level:  Did Not Attend  Description of Group: This group will address the use of boundaries in their personal lives. Patients will explore why boundaries are important, the difference between healthy and unhealthy boundaries, and negative and postive outcomes of different boundaries and will look at how boundaries can be crossed.  Patients will be encouraged to identify current boundaries in their own lives and identify what kind of boundary is being set. Facilitators will guide patients in utilizing problem-solving interventions to address and correct types boundaries being used and to address when no boundary is being used. Understanding and applying boundaries will be explored and addressed for obtaining and maintaining a balanced life. Patients will be encouraged to explore ways to assertively make their boundaries and needs known to significant others in their lives, using other group members and facilitator for role play, support, and feedback.  Therapeutic Goals:  1.  Patient will identify areas in their life where setting clear boundaries could be  used to improve their life.  2.  Patient will identify signs/triggers that a boundary is not being respected. 3.  Patient will identify two ways to set boundaries in order to achieve balance in  their lives: 4.  Patient will demonstrate ability to communicate their needs and set boundaries  through discussion and/or role plays  Summary of Patient Progress:  The patient was invited to group, did not attend.   Therapeutic Modalities:   Cognitive Behavioral Therapy Solution-Focused Therapy  Lynnell Chad, LCSWA 02/16/2021  3:00 PM

## 2021-02-16 NOTE — Progress Notes (Signed)
   02/15/21 2000  Psych Admission Type (Psych Patients Only)  Admission Status Voluntary  Psychosocial Assessment  Patient Complaints None  Eye Contact Fair  Facial Expression Animated  Affect Anxious  Speech Logical/coherent  Interaction Assertive  Motor Activity Other (Comment) (wnl)  Appearance/Hygiene Unremarkable  Behavior Characteristics Appropriate to situation  Mood Pleasant;Other (Comment) ("Normal")  Thought Process  Coherency WDL  Content WDL  Delusions None reported or observed  Perception WDL  Hallucination None reported or observed  Judgment Poor  Confusion None  Danger to Self  Current suicidal ideation? Denies  Danger to Others  Danger to Others None reported or observed  D: Pt alert and oriented. Pt denies experiencing any anxiety/depression at this time. Pt denies experiencing any pain at this time. Pt denies experiencing any SI/HI, or AVH at this time. Requested and received Trazodone 50 mg po prn at 2001 for c/o insomnia. Effective at reassessment.     A: Scheduled medications administered to pt, per MD orders. Support and encouragement provided. Frequent verbal contact made. Routine safety checks conducted q15 minutes.   R: No adverse drug reactions noted. Pt verbally contracts for safety at this time. Pt complaint with medications. Pt interacts appropriately with others on the unit. Pt remains safe at this time. Will continue to monitor.

## 2021-02-16 NOTE — Progress Notes (Addendum)
[facility] MD Progress Note  02/16/2021 7:19 AM Deborah Mckee  MRN:  [id number]  Chief Complaint: SI and substance abuse  Deborah Mckee is a 20 y.o. female with a history of bipolar d/o and polysubstance abuse, who was initially admitted for inpatient psychiatric hospitalization on 02/10/2021 for management of SI and mixed manic episode in the context of ongoing cannabis and cocaine abuse. The patient is currently on Hospital Day 6.   Chart Review, 24 hr Events: The patient's chart was reviewed and nursing notes were reviewed. The patient's case was discussed in multidisciplinary team meeting. Per nursing, patient had no further report of GI sx or withdrawal and attended some groups. She had no acute safety or behavioral concerns noted. Per MAR, she was compliant with scheduled medications and did not require PRNS except Trazodone X1 for sleep.  Information Obtained Today During Patient Interview: On interview this morning, she states she had trouble with sleep and did require Trazodone. She has had no further GI symptoms and specifically denies nausea or vomiting or diarrhea all day yesterday or today. She denies SI, HI, AVH, paranoia, ideas of reference, or first rank symptoms. She states her mood is improved and optimistic and she is excited to potentially go home tomorrow with plans for SAIOP. She denies feeling mood elevated, hypomanic, or having racing thoughts. She visited with her mother yesterday and states the visit went well. Her appetite remains fair. She denies current signs of withdrawal or cravings.  Principal Problem: Bipolar affective disorder, current episode mixed (HCC) Diagnosis: Principal Problem:   Bipolar affective disorder, current episode mixed (HCC) Active Problems:   Chronic post-traumatic stress disorder   Cannabis abuse   Cocaine abuse (HCC)  Total Time Spent in Direct Patient Care:  I personally spent 20 minutes on the unit in direct patient care. The direct patient  care time included face-to-face time with the patient, reviewing the patient's chart, communicating with other professionals, and coordinating care. Greater than 50% of this time was spent in counseling or coordinating care with the patient regarding goals of hospitalization, psycho-education, and discharge planning needs.  Past Psychiatric History: see H&P  Past Medical History:  Past Medical History:  Diagnosis Date   Anxiety    Depression    Vaccine for human papilloma virus (HPV) types 6, 11, 16, and 18 administered    Vision abnormalities    wears glasses    Past Surgical History:  Procedure Laterality Date   INGUINAL HERNIA PEDIATRIC WITH LAPAROSCOPIC EXAM Left 04/22/2017   Procedure: INGUINAL HERNIA PEDIATRIC WITH LAP LOOK OF PELVIC FLOOR;  Surgeon: Leonia Corona, MD;  Location: [facility];  Service: Pediatrics;  Laterality: Left;   INGUINAL HERNIA REPAIR Right 2008   Family History: see H&P  Family Psychiatric  History: see H&P   Social History:  Social History   Substance and Sexual Activity  Alcohol Use No     Social History   Substance and Sexual Activity  Drug Use Yes   Types: Marijuana   Comment: social    Social History   Socioeconomic History   Marital status: Single    Spouse name: Not on file   Number of children: Not on file   Years of education: Not on file   Highest education level: Not on file  Occupational History   Not on file  Tobacco Use   Smoking status: Never   Smokeless tobacco: Never  Vaping Use   Vaping Use: Every day  Substance  and Sexual Activity   Alcohol use: No   Drug use: Yes    Types: Marijuana    Comment: social   Sexual activity: Yes    Birth control/protection: None    Comment: same sex partner  Other Topics Concern   Not on file  Social History Narrative   Not on file   Social Determinants of Health   Financial Resource Strain: Not on file  Food Insecurity: Not on file  Transportation Needs:  Not on file  Physical Activity: Not on file  Stress: Not on file  Social Connections: Not on file   Additional Social History:    Pain Medications: SEE MAR Prescriptions: SEE MAR Over the Counter: SEE MAR History of alcohol / drug use?: Yes Withdrawal Symptoms: Irritability Name of Substance 1: Cocaine 1 - Age of First Use: 20 yrs old 1 - Amount (size/oz): 1 gram 1 - Frequency: daily 1 - Duration: on-going 1 - Last Use / Amount: Friday; "a couple of illness" 1 - Method of Aquiring: unknown 1- Route of Use: unnown    Sleep: Fair  Appetite:  Fair  Current Medications: Current Facility-Administered Medications  Medication Dose Route Frequency Provider Last Rate Last Admin   acetaminophen (TYLENOL) tablet 650 mg  650 mg Oral Q6H PRN Thermon Leyland, NP       alum & mag hydroxide-simeth (MAALOX/MYLANTA) 200-200-20 MG/5ML suspension 30 mL  30 mL Oral Q4H PRN Thermon Leyland, NP       ARIPiprazole (ABILIFY) tablet 20 mg  20 mg Oral Daily Park Pope, MD   20 mg at 02/15/21 0858   dicyclomine (BENTYL) tablet 20 mg  20 mg Oral Q6H PRN Comer Locket, MD       hydrOXYzine (ATARAX/VISTARIL) tablet 25 mg  25 mg Oral Q6H PRN Comer Locket, MD   25 mg at 02/12/21 2105   lithium carbonate (LITHOBID) CR tablet 300 mg  300 mg Oral Q12H Park Pope, MD   300 mg at 02/15/21 1958   loperamide (IMODIUM) capsule 2-4 mg  2-4 mg Oral PRN Comer Locket, MD   2 mg at 02/14/21 2106   magnesium hydroxide (MILK OF MAGNESIA) suspension 30 mL  30 mL Oral Daily PRN Thermon Leyland, NP       methocarbamol (ROBAXIN) tablet 500 mg  500 mg Oral Q8H PRN Mason Jim, Dez Stauffer E, MD       naproxen (NAPROSYN) tablet 500 mg  500 mg Oral BID PRN Comer Locket, MD       nicotine (NICODERM CQ - dosed in mg/24 hours) patch 21 mg  21 mg Transdermal Daily Fransisca Kaufmann A, NP   21 mg at 02/15/21 0859   ondansetron (ZOFRAN-ODT) disintegrating tablet 4 mg  4 mg Oral Q6H PRN Comer Locket, MD   4 mg at 02/14/21 1341    traZODone (DESYREL) tablet 50 mg  50 mg Oral QHS PRN Lamar Sprinkles, MD   50 mg at 02/15/21 2001    Lab Results:  No results found for this or any previous visit (from the past 48 hour(s)).   Physical Findings: AIMS: Facial and Oral Movements Muscles of Facial Expression: None, normal Lips and Perioral Area: None, normal Jaw: None, normal Tongue: None, normal,Extremity Movements Upper (arms, wrists, hands, fingers): None, normal Lower (legs, knees, ankles, toes): None, normal, Trunk Movements Neck, shoulders, hips: None, normal, Overall Severity Severity of abnormal movements (highest score from questions above): None, normal Incapacitation due to abnormal movements:  None, normal Patient's awareness of abnormal movements (rate only patient's report): No Awareness, Dental Status Current problems with teeth and/or dentures?: No Does patient usually wear dentures?: No   Musculoskeletal: Strength & Muscle Tone: within normal limits Gait & Station: normal Patient leans: N/A  Psychiatric Specialty Exam: Physical Exam Vitals and nursing note reviewed.  HENT:     Head: Normocephalic.  Pulmonary:     Effort: Pulmonary effort is normal.  Neurological:     General: No focal deficit present.     Mental Status: She is alert.    Review of Systems  Constitutional:  Negative for chills.  Respiratory:  Negative for shortness of breath.   Cardiovascular:  Negative for chest pain.  Gastrointestinal:  Negative for constipation, diarrhea, nausea and vomiting.  Musculoskeletal:  Negative for arthralgias.   Blood pressure 105/81, pulse 83, temperature 97.9 F (36.6 C), temperature source Oral, resp. rate 18, height 5\' 7"  (1.702 m), weight 54.4 kg, SpO2 99 %.Body mass index is 18.79 kg/m.  General Appearance:  casually dressed, adequate hygiene  Eye Contact:  Good  Speech:  Clear and Coherent and Normal Rate   Volume:  Normal  Mood:  euthymic  Affect:  moderate, stable, full  Thought  Process:  Goal Directed and Linear  Orientation:  Grossly oriented to person, place, time  Thought Content:  Logical and denies AVH, paranoia, or delusions  Suicidal Thoughts:  No  Homicidal Thoughts:  No  Memory:  Recent;   Good  Judgement:  Fair  Insight:  Fair  Psychomotor Activity:  Normal  Concentration:  Improved  Recall:  Good  Fund of Knowledge:  Good  Language:  Good  Akathisia:  Negative  Assets:  Communication Skills Desire for Improvement Housing Resilience Social Support  ADL's:  Intact  Cognition:  WNL  Sleep:  Number of Hours: 6.5   Treatment Plan Summary: Diagnoses / Active Problems: Bipolar d/o MRE mixed Stimulant use d/o - cocaine type Cannabis use d/o Nicotine use d/o PTSD by hx R/o cluster B traits  PLAN: Safety and Monitoring:  -- Voluntary admission to inpatient psychiatric unit for safety, stabilization and treatment  -- Daily contact with patient to assess and evaluate symptoms and progress in treatment  -- Patient's case to be discussed in multi-disciplinary team meeting  -- Observation Level : q15 minute checks  -- Vital signs:  q12 hours  -- Precautions: suicide, elopement, and assault  2. Psychiatric Diagnoses and Treatment:   Bipolar d/o MRE mixed  PTSD by hx  R/O Cluster B traits -- Discontinued Lamictal due to concern for compliance with dosing -- Discontinued Neurontin 100mg  tid at patient's request -- Continue Abilify 20mg  daily for mood stabilization  -- Repeat EKG pending for monitoring of QTC -- Continue Lithium 300mg  bid for mixed manic episode/mood  - lithium level 0.65; TSH 1.842; creatinine 0.87 (BMP WNL except for glucose 69 and anion gap 4)  -- Holding antidepressants at this time due to mixed manic presentation to make sure patient does not have further mood escalation  -- Metabolic profile and EKG monitoring obtained while on an atypical antipsychotic (Lipid Panel: WNL; HbgA1c: 4.6; QTc: 459)   -- Encouraged patient to  participate in unit milieu and in scheduled group therapies   -- Short Term Goals: Ability to verbalize feelings will improve, Ability to disclose and discuss suicidal ideas, and Ability to identify and develop effective coping behaviors will improve  -- Long Term Goals: Improvement in symptoms so as ready for  discharge   Stimulant use d/o - cocaine type Cannabis use d/o Nicotine use d/o -- UDS positive for cocaine and THC -- Continue PRN medications for symptomatic treatment of potential withdrawal (Robaxin, Naprosyn, Vistaril, Bentyl, Zofran, Imodium, Tylenol)  -- COWS scores 1,0,0 (will d/c COWS monitoring) -- Discontinued Neurontin at patient's request -- SW to assist with outpatient SAIOP treatment options for discharge -- Continue Nicoderm patch 21mg /24 hours for smoking cessation  -- Short Term Goals: Ability to identify triggers associated with substance abuse/mental health issues will improve  -- Long Term Goals: Improvement in symptoms so as ready for discharge   3. Medical Issues Being Addressed:   Vomiting and Diarrhea: resolved  -- Questionably due to withdrawal - will continue to monitor and patient has PRNs for Zofran and Imodium   4. Discharge Planning:   -- Social work and case management to assist with discharge planning and identification of hospital follow-up needs prior to discharge  -- Discharge Concerns: Need to establish a safety plan; Medication compliance and effectiveness  -- Discharge Goals: Return home with outpatient referrals for mental health follow-up including medication management/psychotherapy   10-07-1983, MD, FAPA 02/16/2021, 7:19 AM

## 2021-02-16 NOTE — [facility] Group Notes (Signed)
[facility] Group Notes:  (Nursing/MHT/Case Management/Adjunct)  Date:  02/16/2021  Time:  4:23 PM  Type of Therapy:  Music Therapy  Participation Level:  Active  Participation Quality:  Appropriate  Affect:  Appropriate  Cognitive:  Appropriate  Insight:  Appropriate  Engagement in Group:  Engaged  Modes of Intervention:  Activity  Summary of Progress/Problems:Patient was engaged in group as evidenced by singing songs and expressing how it made her feel less anxious.   [name] 02/16/2021, 4:23 PM

## 2021-02-16 NOTE — Plan of Care (Signed)
Spoke with Holleigh Crihfield (mother) [phone] to update mother on patient's status.  Mom has spoken with patient this morning as well as visitor her yesterday.  Mother reports that patient appears "less manic" and very eager for discharge.  Mother has no concerns regarding discharge planning.  Discussed with mother plan for substance abuse intensive outpatient on Tuesday for intake.  Discussed with mother medications that patient is currently using including Abilify, trazodone, lithium, and nicotine patch.  Discussed with mother the fact that patient's lithium level is currently therapeutic.  Encourage mother to monitor patient's mood and lability in the future.  Mother verbalizes agreement and states that she will pick patient up tomorrow around 1 PM.  Any questions mother had have been addressed and mother has no other questions at this time.  Park Pope, MD PGY1 Psychiatry Resident

## 2021-02-16 NOTE — Progress Notes (Signed)
Pt denies SI/HI/AVH and verbally agrees to approach staff if these become apparent or before harming themselves/others. Rates depression 0/10. Rates anxiety 0/10. Rates pain 0/10. Pt having fun singing and dancing at [place]. Pt seen smiling, laughing, and engaging with others. Pt stated that her goal was to go to groups. Pt stated that her mood is "good and ready to go home."Scheduled medications administered to Pt, per MD orders. RN provided support and encouragement to Pt. Q15 min safety checks implemented and continued. Pt safe on the unit. RN will continue to monitor and intervene as needed.   02/16/21 0750  Psych Admission Type (Psych Patients Only)  Admission Status Voluntary  Psychosocial Assessment  Patient Complaints None  Eye Contact Fair  Facial Expression Animated  Affect Appropriate to circumstance  Speech Logical/coherent  Interaction Assertive  Motor Activity Other (Comment) (WDL)  Appearance/Hygiene Unremarkable  Behavior Characteristics Cooperative;Calm;Appropriate to situation  Mood Pleasant  Thought Process  Coherency WDL  Content WDL  Delusions None reported or observed  Perception WDL  Hallucination None reported or observed  Judgment Impaired  Confusion None  Danger to Self  Current suicidal ideation? Denies  Danger to Others  Danger to Others None reported or observed

## 2021-02-16 NOTE — [facility] Group Notes (Signed)
participated and contributed to wrap up group 

## 2021-02-16 NOTE — [facility] Group Notes (Signed)
Patient did not attend group this AM.

## 2021-02-17 ENCOUNTER — Encounter (HOSPITAL_COMMUNITY): Payer: Self-pay

## 2021-02-17 MED ORDER — LITHIUM CARBONATE ER 300 MG PO TBCR: 300.0000 mg | EXTENDED_RELEASE_TABLET | Freq: Two times a day (BID) | ORAL | 0 refills | Status: DC

## 2021-02-17 MED ORDER — TRAZODONE HCL 50 MG PO TABS: 50.0000 mg | ORAL_TABLET | Freq: Every evening | ORAL | 0 refills | Status: DC | PRN

## 2021-02-17 MED ORDER — ARIPIPRAZOLE 20 MG PO TABS: 20.0000 mg | ORAL_TABLET | Freq: Every day | ORAL | 0 refills | Status: DC

## 2021-02-17 MED ORDER — NICOTINE 21 MG/24HR TD PT24: 21.0000 mg | MEDICATED_PATCH | Freq: Every day | TRANSDERMAL | 0 refills | Status: DC
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
## 2021-02-17 NOTE — Progress Notes (Signed)
D:  Patient denied SI and HI, contracts for safety.  Denied A/V hallucinations.  Denied pain.   A:  Medications administered per MD orders.   R:  Safety maintained with 15 minute checks. Patient stated she is ready to discharge today.  Wants to go home.

## 2021-02-17 NOTE — Progress Notes (Signed)
  [facility] Adult Case Management Discharge Plan :  Will you be returning to the same living situation after discharge:  No. Mother's home At discharge, do you have transportation home?: Yes,  mother Do you have the ability to pay for your medications: Yes,  private insurance  Release of information consent forms completed and in the chart;  Patient's signature needed at discharge.  Patient to Follow up at:  Follow-up Information     Ander Slade, PA-C Follow up on 02/18/2021.   Specialty: Physician Assistant Why: You have an appointment for medication management services on 02/18/21 at 1:00 pm.    Virtual Contact information: [place] [address] [address], [place]. Go on 02/18/2021.   Specialty: Behavioral Health Why: You have an appointment on 02/18/21 at 4:30 pm for substance abuse intensive outpatient therapy services (SAIOP), and medication management will be available.  This appointment will be held in person   Please bring your insurance card.  * IF you have not met your out of pocket or copay amounts, the fee for the assessment is $125. Contact information: [place] [address] [phone]                 Next level of care provider has access to [place] and Suicide Prevention discussed: Yes,  mother     Has patient been referred to the Quitline?: N/A patient is not a smoker  Patient has been referred for addiction treatment: Pt. refused referral  Eliott Nine 02/17/2021, 9:40 AM

## 2021-02-17 NOTE — Progress Notes (Signed)
Ayala reported feeling nervous because she wasn't able to get her medications right at 8pm.  Explained procedure for medication administration and she visibly relaxed.  She reported her day an 8/10 (10 the best).  She rated her anxiety and depression are 0/10 (10 the worst).  She is looking forward to being discharged tomorrow.  She denied SI/HI or AVH.   She is currently resting with her eyes closed and appears to be asleep.   02/16/21 2053  Psych Admission Type (Psych Patients Only)  Admission Status Voluntary  Psychosocial Assessment  Patient Complaints Insomnia  Eye Contact Fair  Facial Expression Animated  Affect Appropriate to circumstance  Speech Logical/coherent  Interaction Assertive  Motor Activity Other (Comment)  Appearance/Hygiene Unremarkable  Behavior Characteristics Cooperative;Appropriate to situation  Mood Pleasant  Thought Process  Coherency WDL  Content WDL  Delusions None reported or observed  Perception WDL  Hallucination None reported or observed  Judgment Impaired  Confusion None  Danger to Self  Current suicidal ideation? Denies  Danger to Others  Danger to Others None reported or observed

## 2021-02-17 NOTE — Progress Notes (Signed)
Discharge Note:  Patient discharged with mother to her mother's home.  Suicide prevention information given and discussed with patient who stated she understood and had no questions.  Patient denied SI and HI.  Denied A/V hallucinations.  Patient stated she received all her belongings, clothing, toiletries, misc items, etc.  Patient stated she appreciated all assistance received from [facility] staff.  All required discharge information given.

## 2021-02-17 NOTE — Group Note (Deleted)
Occupational Therapy Group Note  Group Topic:Feelings Management  Group Date: 02/17/2021 Start Time: 1400 End Time: 1445 Facilitators: Donne Hazel, OT/L    Group Description: Group encouraged increased participation and engagement through discussion focused on STRENGTHS. Patients were encouraged to fill out a worksheet to structure discussion, identifying ten strengths that they currently have. Patients were then instructed to utilize various symbols to identify which strengths help them in their relationships, school/profession, and leisure participation. Discussion also focused on identifying one strength they do not currently have but would like. Discussion within the group followed with patients sharing their responses and highlighting their own personal strengths.   Therapeutic Goals: Identify strengths vs weaknesses Discuss and identify ways we can highlight our strengths      Participation Level: {OT [facility] Participation Level:26267}   Participation Quality: {OT [facility] Participation Quality:26268}   Behavior: {[facility] OT Group Behavior:26269}   Speech/Thought Process: {[facility] OT Speech/Thought Process:26270}   Affect/Mood: {OT [facility] Affect/Mood:26271}   Insight: {OT [facility] Insight:26272}   Judgement: {OT [facility] Judgement:26272}   Individualization: *** was *** in their participation of group discussion/activity. *** identified  Modes of Intervention: {[facility] MODES OF INTERVENTION:26273}  Patient Response to Interventions:  {[facility] OT Patient Response to Interventions:26274}   Plan: Continue to engage patient in OT groups 2 - 3x/week.  02/17/2021  Donne Hazel, OT/L

## 2021-02-17 NOTE — BH IP Treatment Plan (Signed)
Interdisciplinary Treatment and Diagnostic Plan Update  02/17/2021 Time of Session: 9:25am Deborah Mckee MRN: [id number]  Principal Diagnosis: Bipolar affective disorder, current episode mixed (HCC)  Secondary Diagnoses: Principal Problem:   Bipolar affective disorder, current episode mixed (HCC) Active Problems:   Chronic post-traumatic stress disorder   Cannabis abuse   Cocaine abuse (HCC)   Current Medications:  Current Facility-Administered Medications  Medication Dose Route Frequency Provider Last Rate Last Admin   acetaminophen (TYLENOL) tablet 650 mg  650 mg Oral Q6H PRN Thermon Leyland, NP       alum & mag hydroxide-simeth (MAALOX/MYLANTA) 200-200-20 MG/5ML suspension 30 mL  30 mL Oral Q4H PRN Thermon Leyland, NP       ARIPiprazole (ABILIFY) tablet 20 mg  20 mg Oral Daily Park Pope, MD   20 mg at 02/17/21 0829   lithium carbonate (LITHOBID) CR tablet 300 mg  300 mg Oral Q12H Park Pope, MD   300 mg at 02/17/21 0830   magnesium hydroxide (MILK OF MAGNESIA) suspension 30 mL  30 mL Oral Daily PRN Thermon Leyland, NP       nicotine (NICODERM CQ - dosed in mg/24 hours) patch 21 mg  21 mg Transdermal Daily Fransisca Kaufmann A, NP   21 mg at 02/17/21 0830   traZODone (DESYREL) tablet 50 mg  50 mg Oral QHS PRN Lamar Sprinkles, MD   50 mg at 02/16/21 2052   PTA Medications: Medications Prior to Admission  Medication Sig Dispense Refill Last Dose   ARIPiprazole (ABILIFY) 10 MG tablet Take 10 mg by mouth daily.      escitalopram (LEXAPRO) 10 MG tablet Take 10 mg by mouth at bedtime.      lithium carbonate (ESKALITH) 450 MG CR tablet Take by mouth at bedtime.      lurasidone (LATUDA) 20 MG TABS tablet Take 20 mg by mouth at bedtime.      buPROPion (WELLBUTRIN XL) 300 MG 24 hr tablet Take 300 mg by mouth every morning.      lamoTRIgine (LAMICTAL) 200 MG tablet Take 1 tablet (200 mg total) by mouth at bedtime. 90 tablet 2    lithium carbonate 150 MG capsule Take 150 mg by mouth every morning.        Patient Stressors:    Patient Strengths:    Treatment Modalities: Medication Management, Group therapy, Case management,  1 to 1 session with clinician, Psychoeducation, Recreational therapy.   Physician Treatment Plan for Primary Diagnosis: Bipolar affective disorder, current episode mixed (HCC) Long Term Goal(s): Improvement in symptoms so as ready for discharge   Short Term Goals: Ability to identify triggers associated with substance abuse/mental health issues will improve Ability to verbalize feelings will improve Ability to disclose and discuss suicidal ideas Ability to identify and develop effective coping behaviors will improve  Medication Management: Evaluate patient's response, side effects, and tolerance of medication regimen.  Therapeutic Interventions: 1 to 1 sessions, Unit Group sessions and Medication administration.  Evaluation of Outcomes: Adequate for Discharge  Physician Treatment Plan for Secondary Diagnosis: Principal Problem:   Bipolar affective disorder, current episode mixed (HCC) Active Problems:   Chronic post-traumatic stress disorder   Cannabis abuse   Cocaine abuse (HCC)  Long Term Goal(s): Improvement in symptoms so as ready for discharge   Short Term Goals: Ability to identify triggers associated with substance abuse/mental health issues will improve Ability to verbalize feelings will improve Ability to disclose and discuss suicidal ideas Ability to identify and develop effective  coping behaviors will improve     Medication Management: Evaluate patient's response, side effects, and tolerance of medication regimen.  Therapeutic Interventions: 1 to 1 sessions, Unit Group sessions and Medication administration.  Evaluation of Outcomes: Adequate for Discharge   RN Treatment Plan for Primary Diagnosis: Bipolar affective disorder, current episode mixed (HCC) Long Term Goal(s): Knowledge of disease and therapeutic regimen to maintain health  will improve  Short Term Goals: Ability to remain free from injury will improve, Ability to verbalize frustration and anger appropriately will improve, Ability to demonstrate self-control, Ability to identify and develop effective coping behaviors will improve, and Compliance with prescribed medications will improve  Medication Management: RN will administer medications as ordered by provider, will assess and evaluate patient's response and provide education to patient for prescribed medication. RN will report any adverse and/or side effects to prescribing provider.  Therapeutic Interventions: 1 on 1 counseling sessions, Psychoeducation, Medication administration, Evaluate responses to treatment, Monitor vital signs and CBGs as ordered, Perform/monitor CIWA, COWS, AIMS and Fall Risk screenings as ordered, Perform wound care treatments as ordered.  Evaluation of Outcomes: Adequate for Discharge   LCSW Treatment Plan for Primary Diagnosis: Bipolar affective disorder, current episode mixed (HCC) Long Term Goal(s): Safe transition to appropriate next level of care at discharge, Engage patient in therapeutic group addressing interpersonal concerns.  Short Term Goals: Engage patient in aftercare planning with referrals and resources, Increase social support, Increase ability to appropriately verbalize feelings, Identify triggers associated with mental health/substance abuse issues, and Increase skills for wellness and recovery  Therapeutic Interventions: Assess for all discharge needs, 1 to 1 time with Social worker, Explore available resources and support systems, Assess for adequacy in community support network, Educate family and significant other(s) on suicide prevention, Complete Psychosocial Assessment, Interpersonal group therapy.  Evaluation of Outcomes: Adequate for Discharge   Progress in Treatment: Attending groups: Yes. Participating in groups: Yes. Taking medication as prescribed:  Yes. Toleration medication: Yes. Family/Significant other contact made: Yes, individual(s) contacted:  mother Patient understands diagnosis: Yes. Discussing patient identified problems/goals with staff: Yes. Medical problems stabilized or resolved: Yes. Denies suicidal/homicidal ideation: Yes. Issues/concerns per patient self-inventory: No.   New problem(s) identified: No, Describe:  None    New Short Term/Long Term Goal(s): medication stabilization, elimination of SI thoughts, development of comprehensive mental wellness plan.    Patient Goals: "To get better and to go home"   Discharge Plan or Barriers: Pt is to stay with mother at discharge and is to begin SAIOP at discharge.  Reason for Continuation of Hospitalization: Medication stabilization  Estimated Length of Stay: 1 day   Scribe for Treatment Team: Otelia Santee, LCSW 02/17/2021 10:27 AM

## 2021-02-17 NOTE — Plan of Care (Signed)
Nurse discussed anxiety and coping skills with patient. 

## 2021-02-17 NOTE — Progress Notes (Signed)
Adult Psychoeducational Group Note  Date:  02/17/2021 Time:  10:52 AM  Group Topic/Focus:  Goals Group:   The focus of this group is to help patients establish daily goals to achieve during treatment and discuss how the patient can incorporate goal setting into their daily lives to aide in recovery.  Participation Level:  Active  Participation Quality:  Appropriate  Affect:  Appropriate  Cognitive:  Appropriate  Insight: Appropriate  Engagement in Group:  Engaged  Modes of Intervention:  Discussion  Additional Comments:  Pt stated her goal for the day is to stay clean.  Wynema Birch D 02/17/2021, 10:52 AM
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
## 2021-02-17 NOTE — Group Note (Deleted)
LCSW Group Therapy Note   Group Date: 02/17/2021 Start Time: 1300 End Time: 1345   Type of Therapy and Topic:  Group Therapy:   Participation Level:  {BHH PARTICIPATION LSLHT:34287}  Description of Group:   Therapeutic Goals:  1.     Summary of Patient Progress:    ***  Therapeutic Modalities:   Chrys Racer 02/17/2021  1:45 PM

## 2021-02-18 ENCOUNTER — Ambulatory Visit: Payer: 59 | Admitting: Gastroenterology

## 2021-11-09 NOTE — Progress Notes (Unsigned)
PCP:  Gildardo Pounds, MD   No chief complaint on file.    HPI:      Ms. Deborah Mckee is a 21 y.o. No obstetric history on file. whose LMP was No LMP recorded. (Menstrual status: Irregular Periods)., presents today for her NP annual examination.  Her menses were regular every 28-30 days, lasting 5 days on OCPs/sprintec.  Dysmenorrhea mild, occurring first 1-2 days of flow. She does not have intermenstrual bleeding. Stopped OCPs 1 1/2 months ago since not needed for contraception. No menses yet but is starting to get cramping. Hx of menses Q6 wks age 15 prior to OCPs.    Sex activity: has sex with females. Had female partner in past Last Pap: N/A due to age Hx of STDs: none  Having issues with abd pain for several months, with occas vomiting/diarrhea. Thinks it's related to the many meds she is on for psych. Sx improved with eating; often times feels like gas pains. Has decreased appetite.  There is no FH of breast cancer. There is no FH of ovarian cancer. The patient does do self-breast exams.  Tobacco use: vapes daily Alcohol use: infrequent Daily marijuana use.  Exercise: moderately active  She does get adequate calcium but not Vitamin D in her diet.   Gardasil completed.   Past Medical History:  Diagnosis Date   Anxiety    Depression    Vaccine for human papilloma virus (HPV) types 6, 11, 16, and 18 administered    Vision abnormalities    wears glasses    Past Surgical History:  Procedure Laterality Date   INGUINAL HERNIA PEDIATRIC WITH LAPAROSCOPIC EXAM Left 04/22/2017   Procedure: INGUINAL HERNIA PEDIATRIC WITH LAP LOOK OF PELVIC FLOOR;  Surgeon: Leonia Corona, MD;  [facility];  Service: Pediatrics;  Laterality: Left;   INGUINAL HERNIA REPAIR Right 2008    No family history on file.  Social History   Socioeconomic History   Marital status: Single    Spouse name: Not on file   Number of children: Not on file   Years of education: Not on  file   Highest education level: Not on file  Occupational History   Not on file  Tobacco Use   Smoking status: Never   Smokeless tobacco: Never  Vaping Use   Vaping Use: Every day  Substance and Sexual Activity   Alcohol use: No   Drug use: Yes    Types: Marijuana    Comment: social   Sexual activity: Yes    Birth control/protection: None    Comment: same sex partner  Other Topics Concern   Not on file  Social History Narrative   Not on file   Social Determinants of Health   Financial Resource Strain: Not on file  Food Insecurity: Not on file  Transportation Needs: Not on file  Physical Activity: Not on file  Stress: Not on file  Social Connections: Not on file  Intimate Partner Violence: Not on file     Current Outpatient Medications:    ARIPiprazole (ABILIFY) 20 MG tablet, Take 1 tablet (20 mg total) by mouth daily., Disp: 30 tablet, Rfl: 0   lithium carbonate (LITHOBID) 300 MG CR tablet, Take 1 tablet (300 mg total) by mouth 2 (two) times daily., Disp: 60 tablet, Rfl: 0   nicotine (NICODERM CQ - DOSED IN MG/24 HOURS) 21 mg/24hr patch, Place 1 patch (21 mg total) onto the skin daily., Disp: 30 patch, Rfl: 0  traZODone (DESYREL) 50 MG tablet, Take 1 tablet (50 mg total) by mouth at bedtime as needed for sleep., Disp: 30 tablet, Rfl: 0     ROS:  Review of Systems  Constitutional:  Negative for fatigue, fever and unexpected weight change.  Respiratory:  Negative for cough, shortness of breath and wheezing.   Cardiovascular:  Negative for chest pain, palpitations and leg swelling.  Gastrointestinal:  Positive for abdominal pain, diarrhea and vomiting. Negative for blood in stool, constipation and nausea.  Endocrine: Negative for cold intolerance, heat intolerance and polyuria.  Genitourinary:  Negative for dyspareunia, dysuria, flank pain, frequency, genital sores, hematuria, menstrual problem, pelvic pain, urgency, vaginal bleeding, vaginal discharge and vaginal  pain.  Musculoskeletal:  Negative for back pain, joint swelling and myalgias.  Skin:  Negative for rash.  Neurological:  Negative for dizziness, syncope, light-headedness, numbness and headaches.  Hematological:  Negative for adenopathy.  Psychiatric/Behavioral:  Negative for agitation, confusion, sleep disturbance and suicidal ideas. The patient is not nervous/anxious.   BREAST: No symptoms   Objective: There were no vitals taken for this visit.   Physical Exam Constitutional:      Appearance: She is well-developed.  Genitourinary:     Vulva normal.     Right Labia: No rash, tenderness or lesions.    Left Labia: No tenderness, lesions or rash.    No vaginal discharge, erythema or tenderness.      Right Adnexa: not tender and no mass present.    Left Adnexa: not tender and no mass present.    No cervical friability or polyp.     Uterus is not enlarged or tender.  Breasts:    Right: No mass, nipple discharge, skin change or tenderness.     Left: No mass, nipple discharge, skin change or tenderness.  Neck:     Thyroid: No thyromegaly.  Cardiovascular:     Rate and Rhythm: Normal rate and regular rhythm.     Heart sounds: Normal heart sounds. No murmur heard. Pulmonary:     Effort: Pulmonary effort is normal.     Breath sounds: Normal breath sounds.  Abdominal:     Palpations: Abdomen is soft.     Tenderness: There is no abdominal tenderness. There is no guarding or rebound.  Musculoskeletal:        General: Normal range of motion.     Cervical back: Normal range of motion.  Lymphadenopathy:     Cervical: No cervical adenopathy.  Neurological:     General: No focal deficit present.     Mental Status: She is alert and oriented to person, place, and time.     Cranial Nerves: No cranial nerve deficit.  Skin:    General: Skin is warm and dry.  Psychiatric:        Mood and Affect: Mood normal.        Behavior: Behavior normal.        Thought Content: Thought content  normal.        Judgment: Judgment normal.  Vitals reviewed.     Assessment/Plan: Encounter for annual routine gynecological examination  Screening for STD (sexually transmitted disease) - Plan: Cervicovaginal ancillary only  Pain of upper abdomen--being followed by psych. Neg exam. Question side effect of Rx.     GYN counsel adequate intake of calcium and vitamin D, diet and exercise     F/U  No follow-ups on file.  Alonzo Loving B. Eason Housman, PA-C 11/09/2021 10:04 PM

## 2021-11-13 ENCOUNTER — Other Ambulatory Visit (HOSPITAL_COMMUNITY)
Admission: RE | Admit: 2021-11-13 | Discharge: 2021-11-13 | Disposition: A | Discharge status: Home / Self Care | Payer: 59 | Source: Ambulatory Visit | Attending: Obstetrics and Gynecology | Admitting: Obstetrics and Gynecology

## 2021-11-13 ENCOUNTER — Encounter: Payer: Self-pay | Admitting: Obstetrics and Gynecology

## 2021-11-13 ENCOUNTER — Ambulatory Visit (INDEPENDENT_AMBULATORY_CARE_PROVIDER_SITE_OTHER): Payer: 59 | Admitting: Obstetrics and Gynecology

## 2021-11-13 VITALS — BP 120/80 | Ht 67.5 in | Wt 129.0 lb

## 2021-11-13 DIAGNOSIS — Z01419 Encounter for gynecological examination (general) (routine) without abnormal findings: Secondary | ICD-10-CM

## 2021-11-13 DIAGNOSIS — B9689 Other specified bacterial agents as the cause of diseases classified elsewhere: Secondary | ICD-10-CM

## 2021-11-13 DIAGNOSIS — Z30013 Encounter for initial prescription of injectable contraceptive: Secondary | ICD-10-CM | POA: Diagnosis not present

## 2021-11-13 DIAGNOSIS — Z113 Encounter for screening for infections with a predominantly sexual mode of transmission: Secondary | ICD-10-CM

## 2021-11-13 DIAGNOSIS — N760 Acute vaginitis: Secondary | ICD-10-CM | POA: Diagnosis not present

## 2021-11-13 DIAGNOSIS — R3915 Urgency of urination: Secondary | ICD-10-CM

## 2021-11-13 LAB — POCT URINALYSIS DIPSTICK
Bilirubin, UA: NEGATIVE
Blood, UA: NEGATIVE
Glucose, UA: NEGATIVE
Ketones, UA: NEGATIVE
Leukocytes, UA: NEGATIVE
Nitrite, UA: NEGATIVE
Protein, UA: POSITIVE — AB
Spec Grav, UA: >=1.03 — AB (ref 1.010–1.025)
Urobilinogen, UA: 0.2 E.U./dL
pH, UA: 5 (ref 5.0–8.0)

## 2021-11-13 LAB — POCT WET PREP WITH KOH
Clue Cells Wet Prep HPF POC: POSITIVE
KOH Prep POC: POSITIVE — AB
Trichomonas, UA: NEGATIVE
Yeast Wet Prep HPF POC: NEGATIVE

## 2021-11-13 MED ORDER — MEDROXYPROGESTERONE ACETATE 150 MG/ML IM SUSP
150.0000 mg | INTRAMUSCULAR | Status: AC
  Administered 2021-11-28 – 2022-02-23 (×2): 150 mg via INTRAMUSCULAR

## 2021-11-13 MED ORDER — METRONIDAZOLE 0.75 % VA GEL: 1.0000 | Freq: Every day | VAGINAL | 0 refills | Status: AC

## 2021-11-13 NOTE — Patient Instructions (Signed)
I value your feedback and you entrusting us with your care. If you get a [facility] patient survey, I would appreciate you taking the time to let us know about your experience today. Thank you! ? ? ?

## 2021-11-14 LAB — CERVICOVAGINAL ANCILLARY ONLY
Chlamydia: NEGATIVE
Comment: NEGATIVE
Comment: NORMAL
Neisseria Gonorrhea: NEGATIVE

## 2021-11-15 LAB — URINE CULTURE

## 2021-11-25 ENCOUNTER — Telehealth: Payer: Self-pay

## 2021-11-25 DIAGNOSIS — N898 Other specified noninflammatory disorders of vagina: Secondary | ICD-10-CM

## 2021-11-25 NOTE — Telephone Encounter (Signed)
Pt calling; took BV med qod instead of 5d in a row; now her vagina is inflammed, d/c is much more clumpy than when she was seen; is pretty sure it has progressed to PID, painful intercourse as well.  Doesn't know if she needs a refill of same medication or what to do?  [phone]

## 2021-11-25 NOTE — Telephone Encounter (Signed)
Sounds more like yeast vag. Pls eval her for those sx and send in diflucan if indicative of yeast. QOD tx is ok.

## 2021-11-26 NOTE — Telephone Encounter (Signed)
Left detailed msg to call with color of d/c and if having vaginal itching.

## 2021-11-27 MED ORDER — FLUCONAZOLE 150 MG PO TABS: ORAL_TABLET | ORAL | 0 refills | Status: DC

## 2021-11-27 NOTE — Telephone Encounter (Signed)
[employer] VOICEMAIL: Patient returning call. She reports the color was bright white and "balls", now it's red because she has started her period.

## 2021-11-27 NOTE — Telephone Encounter (Signed)
Spoke w/patient to inquire if she is having any itching. Patient reports she is having extreme itching and irritation inside vagina. Her period started during the night last night. She was advised to contact us to schedule appointment for depo within five days of her period starting. Advised will send Diflucan to treat for yeast infection. She will contact office for evaluation if symptoms have not resolved in 9 days. Appointment scheduled for 11/28/21 for Depo injection.

## 2021-11-28 ENCOUNTER — Ambulatory Visit (INDEPENDENT_AMBULATORY_CARE_PROVIDER_SITE_OTHER): Payer: 59

## 2021-11-28 VITALS — BP 100/60 | HR 74 | Ht 67.5 in | Wt 133.0 lb

## 2021-11-28 DIAGNOSIS — Z30013 Encounter for initial prescription of injectable contraceptive: Secondary | ICD-10-CM

## 2021-11-28 NOTE — Progress Notes (Signed)
Date last pap: N/A. Last Depo-Provera: N/A.  Side Effects if any: N/A. Serum HCG indicated? N/A. Depo-Provera 150 mg IM given by: Rocco Serene, LPN. Site: Left Upper Outer Quadrant Next appointment due 02/06/22 - 02/20/22.

## 2022-02-23 ENCOUNTER — Ambulatory Visit (INDEPENDENT_AMBULATORY_CARE_PROVIDER_SITE_OTHER): Payer: 59

## 2022-02-23 VITALS — BP 128/64 | HR 87 | Resp 16 | Ht 67.5 in | Wt 130.9 lb

## 2022-02-23 DIAGNOSIS — Z3202 Encounter for pregnancy test, result negative: Secondary | ICD-10-CM

## 2022-02-23 DIAGNOSIS — Z3042 Encounter for surveillance of injectable contraceptive: Secondary | ICD-10-CM

## 2022-02-23 LAB — POCT URINE PREGNANCY: Preg Test, Ur: NEGATIVE

## 2022-02-23 NOTE — Patient Instructions (Signed)

## 2022-02-23 NOTE — Progress Notes (Cosign Needed Addendum)
    Nurse Visit Note  Subjective:    Patient ID: Lucius Dettinger, female    DOB: 10-27-2000, 21 y.o.   MRN: [id number]   Patient is a 21 y.o. G0P0000 female who presents for surveillance of depo injection.   Date last pap: N/A. Last Depo-Provera:11/28/2021  Side Effects if any: She reports that she has been bleeding for the past two months. Bleeding is constant. Serum HCG indicated? Negative Depo-Provera 150 mg IM given by: Cristy Folks, CMA Next appointment due: 05/11/2022 - 05/25/2022

## 2022-05-11 ENCOUNTER — Ambulatory Visit (INDEPENDENT_AMBULATORY_CARE_PROVIDER_SITE_OTHER): Payer: Self-pay

## 2022-05-11 VITALS — BP 115/71 | HR 74 | Ht 67.5 in | Wt 132.9 lb

## 2022-05-11 DIAGNOSIS — Z3042 Encounter for surveillance of injectable contraceptive: Secondary | ICD-10-CM

## 2022-05-11 MED ORDER — MEDROXYPROGESTERONE ACETATE 150 MG/ML IM SUSP
150.0000 mg | Freq: Once | INTRAMUSCULAR | Status: AC
  Administered 2022-05-11: 150 mg via INTRAMUSCULAR

## 2022-05-11 NOTE — Progress Notes (Signed)
    NURSE VISIT NOTE  Subjective:    Patient ID: Deborah Mckee, female    DOB: 02-Jun-2000, 22 y.o.   MRN: [id number]  HPI  Patient is a 22 y.o. G0P0000 female who presents for depo provera injection.   Objective:    BP 115/71   Pulse 74   Ht 5' 7.5" (1.715 m)   Wt 132 lb 14.4 oz (60.3 kg)   BMI 20.51 kg/m   Last Annual: 11/13/21. Last pap: due for first pap this year due to age. Last Depo-Provera: 02/23/22. Side Effects if any: n/a. Serum HCG indicated? No . Depo-Provera 150 mg IM given by: Douglass Rivers, CMA. Site: Left [place]   Assessment:   1. Encounter for management and injection of depo-Provera      Plan:   Next appointment due between 07/28/22 and 08/11/22.    Marykay Lex, CMA

## 2022-07-30 ENCOUNTER — Ambulatory Visit: Payer: 59

## 2022-07-30 NOTE — Progress Notes (Deleted)
    NURSE VISIT NOTE  Subjective:    Patient ID: ALAHIA KNUEVEN, female    DOB: [birth year], 22 y.o.   MRN: [id number]  HPI  Patient is a 22 y.o. G0P0000 female who presents for depo provera injection.   Objective:    There were no vitals taken for this visit.  Last Annual: 11/13/2021. Last pap: Never Done. Last Depo-Provera: 05/11/2022. Side Effects if any: {NONE:[id number]}***. Serum HCG indicated? No . Depo-Provera 150 mg IM given by: Santiago Bumpers, CMA. Site: {[facility] INJ [id number]  Lab Review  @THIS  VISIT ONLY@  Assessment:   1. Encounter for management and injection of depo-Provera      Plan:   Next appointment due between June 27 and July 11.    Santiago Bumpers, CMA [facility]

## 2022-09-12 ENCOUNTER — Other Ambulatory Visit: Payer: Self-pay

## 2022-09-12 ENCOUNTER — Emergency Department
Admission: EM | Admit: 2022-09-12 | Discharge: 2022-09-13 | Disposition: A | Discharge status: Other Acute Inpatient Hospital | Payer: BC Managed Care – PPO | Attending: Emergency Medicine | Admitting: Emergency Medicine

## 2022-09-12 DIAGNOSIS — F29 Unspecified psychosis not due to a substance or known physiological condition: Secondary | ICD-10-CM | POA: Diagnosis not present

## 2022-09-12 DIAGNOSIS — Z20822 Contact with and (suspected) exposure to covid-19: Secondary | ICD-10-CM | POA: Diagnosis not present

## 2022-09-12 DIAGNOSIS — Z79899 Other long term (current) drug therapy: Secondary | ICD-10-CM | POA: Diagnosis not present

## 2022-09-12 DIAGNOSIS — F32A Depression, unspecified: Secondary | ICD-10-CM | POA: Diagnosis present

## 2022-09-12 DIAGNOSIS — F1410 Cocaine abuse, uncomplicated: Secondary | ICD-10-CM | POA: Diagnosis not present

## 2022-09-12 DIAGNOSIS — F3163 Bipolar disorder, current episode mixed, severe, without psychotic features: Secondary | ICD-10-CM

## 2022-09-12 DIAGNOSIS — F3481 Disruptive mood dysregulation disorder: Secondary | ICD-10-CM | POA: Diagnosis not present

## 2022-09-12 DIAGNOSIS — R45851 Suicidal ideations: Secondary | ICD-10-CM | POA: Diagnosis not present

## 2022-09-12 DIAGNOSIS — F3160 Bipolar disorder, current episode mixed, unspecified: Secondary | ICD-10-CM | POA: Diagnosis not present

## 2022-09-12 LAB — URINE DRUG SCREEN, QUALITATIVE (ARMC ONLY)
Amphetamines, Ur Screen: NOT DETECTED
Barbiturates, Ur Screen: NOT DETECTED
Benzodiazepine, Ur Scrn: NOT DETECTED
Cannabinoid 50 Ng, Ur ~~LOC~~: POSITIVE — AB
Cocaine Metabolite,Ur ~~LOC~~: POSITIVE — AB
MDMA (Ecstasy)Ur Screen: NOT DETECTED
Methadone Scn, Ur: NOT DETECTED
Opiate, Ur Screen: NOT DETECTED
Phencyclidine (PCP) Ur S: NOT DETECTED
Tricyclic, Ur Screen: NOT DETECTED

## 2022-09-12 LAB — COMPREHENSIVE METABOLIC PANEL
ALT: 9 U/L (ref 0–44)
AST: 16 U/L (ref 15–41)
Albumin: 4.2 g/dL (ref 3.5–5.0)
Alkaline Phosphatase: 65 U/L (ref 38–126)
Anion gap: 9 (ref 5–15)
BUN: 11 mg/dL (ref 6–20)
CO2: 25 mmol/L (ref 22–32)
Calcium: 8.7 mg/dL — ABNORMAL LOW (ref 8.9–10.3)
Chloride: 104 mmol/L (ref 98–111)
Creatinine, Ser: 0.9 mg/dL (ref 0.44–1.00)
GFR, Estimated: >60 mL/min (ref >60)
Glucose, Bld: 105 mg/dL — ABNORMAL HIGH (ref 70–99)
Potassium: 3.2 mmol/L — ABNORMAL LOW (ref 3.5–5.1)
Sodium: 138 mmol/L (ref 135–145)
Total Bilirubin: 0.8 mg/dL (ref 0.3–1.2)
Total Protein: 6.9 g/dL (ref 6.5–8.1)

## 2022-09-12 LAB — CBC
HCT: 44.1 % (ref 36.0–46.0)
Hemoglobin: 14.7 g/dL (ref 12.0–15.0)
MCH: 29.2 pg (ref 26.0–34.0)
MCHC: 33.3 g/dL (ref 30.0–36.0)
MCV: 87.7 fL (ref 80.0–100.0)
Platelets: 192 10*3/uL (ref 150–400)
RBC: 5.03 MIL/uL (ref 3.87–5.11)
RDW: 12.1 % (ref 11.5–15.5)
WBC: 6.1 10*3/uL (ref 4.0–10.5)
nRBC: 0 % (ref 0.0–0.2)

## 2022-09-12 LAB — ACETAMINOPHEN LEVEL: Acetaminophen (Tylenol), Serum: <10 ug/mL — ABNORMAL LOW (ref 10–30)

## 2022-09-12 LAB — SALICYLATE LEVEL: Salicylate Lvl: <7 mg/dL — ABNORMAL LOW (ref 7.0–30.0)

## 2022-09-12 LAB — ETHANOL: Alcohol, Ethyl (B): 29 mg/dL — ABNORMAL HIGH (ref <10)

## 2022-09-12 LAB — POC URINE PREG, ED: Preg Test, Ur: NEGATIVE

## 2022-09-12 MED ORDER — TRAZODONE HCL 100 MG PO TABS
100.0000 mg | ORAL_TABLET | Freq: Every day | ORAL | Status: DC
  Administered 2022-09-12: 100 mg via ORAL
  Filled 2022-09-12: qty 1

## 2022-09-12 MED ORDER — LITHIUM CARBONATE ER 450 MG PO TBCR
450.0000 mg | EXTENDED_RELEASE_TABLET | Freq: Every day | ORAL | Status: DC
  Administered 2022-09-12: 450 mg via ORAL
  Filled 2022-09-12: qty 1

## 2022-09-12 MED ORDER — ARIPIPRAZOLE 10 MG PO TABS
20.0000 mg | ORAL_TABLET | Freq: Every day | ORAL | Status: DC
  Administered 2022-09-12: 20 mg via ORAL
  Filled 2022-09-12: qty 2

## 2022-09-12 MED ORDER — LITHIUM CARBONATE ER 300 MG PO TBCR
300.0000 mg | EXTENDED_RELEASE_TABLET | Freq: Every day | ORAL | Status: DC
  Administered 2022-09-12: 300 mg via ORAL
  Filled 2022-09-12: qty 1

## 2022-09-12 NOTE — ED Notes (Signed)
Psych NP speaking with patient 

## 2022-09-12 NOTE — ED Provider Notes (Addendum)
[facility] Provider Note    Event Date/Time   First MD Initiated Contact with Patient 09/12/22 1821     (approximate)   History   Psychiatric Evaluation   HPI  Deborah Mckee is a 22 y.o. female with a history of bipolar disorder and polysubstance abuse who presents with suicidal ideation over the last several days.  The patient states that suicide is "all I think about."  However, the patient states that she has not actually tried to harm herself last few days.  She has been hospitalized for depression before.  She states that she has not been consistently taking her mental health medications for several weeks.  She endorses marijuana use and alcohol but denies other drugs.  The patient states that a few days ago she was at work and blacked out.  She drank a bottle of Everclear liquor and does not remember what happened.  Patient denies any acute medical complaints.  I have the past medical records.  The patient was most recently admitted to the inpatient psychiatry service in October 2022 for suicidal ideation and a mixed manic episode in the context of ongoing substance abuse.   Physical Exam   Triage Vital Signs: ED Triage Vitals [09/12/22 1803]  Enc Vitals Group     BP 117/81     Pulse Rate 92     Resp 16     Temp 98.6 F (37 C)     Temp Source Oral     SpO2 98 %     Weight 118 lb (53.5 kg)     Height 5\' 7"  (1.702 m)     Head Circumference      Peak Flow      Pain Score 0     Pain Loc      Pain Edu?      Excl. in GC?     Most recent vital signs: Vitals:   09/12/22 1803  BP: 117/81  Pulse: 92  Resp: 16  Temp: 98.6 F (37 C)  SpO2: 98%     General: Awake, no distress.  CV:  Good peripheral perfusion.  Resp:  Normal effort.  Abd:  No distention.  Other:  Calm and cooperative.   ED Results / Procedures / Treatments   Labs (all labs ordered are listed, but only abnormal results are displayed) Labs Reviewed  COMPREHENSIVE  METABOLIC PANEL - Abnormal; Notable for the following components:      Result Value   Potassium 3.2 (*)    Glucose, Bld 105 (*)    Calcium 8.7 (*)    All other components within normal limits  ETHANOL - Abnormal; Notable for the following components:   Alcohol, Ethyl (B) 29 (*)    All other components within normal limits  SALICYLATE LEVEL - Abnormal; Notable for the following components:   Salicylate Lvl <7.0 (*)    All other components within normal limits  ACETAMINOPHEN LEVEL - Abnormal; Notable for the following components:   Acetaminophen (Tylenol), Serum <10 (*)    All other components within normal limits  URINE DRUG SCREEN, QUALITATIVE ([facility] ONLY) - Abnormal; Notable for the following components:   Cocaine Metabolite,Ur [place] POSITIVE (*)    Cannabinoid 50 Ng, Ur [place]er POSITIVE (*)    All other components within normal limits  CBC  POC URINE PREG, ED     EKG    RADIOLOGY    PROCEDURES:  Critical Care performed: No  Procedures   MEDICATIONS  ORDERED IN ED: Medications  ARIPiprazole (ABILIFY) tablet 20 mg (20 mg Oral Given 09/12/22 2143)  lithium carbonate [facility]) ER tablet 450 mg (450 mg Oral Given 09/12/22 2147)  lithium carbonate (LITHOBID) ER tablet 300 mg (300 mg Oral Given 09/12/22 2144)  traZODone (DESYREL) tablet 100 mg (100 mg Oral Given 09/12/22 2143)     IMPRESSION / MDM / ASSESSMENT AND PLAN / ED COURSE  I reviewed the triage vital signs and the nursing notes.  Differential diagnosis includes, but is not limited to, bipolar disorder, major depressive disorder, substance-induced mood disorder.  Patient's presentation is most consistent with severe exacerbation of chronic illness.  The patient reports active SI and is unable to contract for safety.  I have placed her under involuntary commitment.  Have ordered psychiatry and TTS consults as well as lab workup for medical screening.  On the labs, UDS is positive for cocaine and cannabinoids.  Ethanol  level is minimally elevated.  Acetaminophen and salicylate are negative.  The patient has been placed in psychiatric observation due to the need to provide a safe environment for the patient while obtaining psychiatric consultation and evaluation, as well as ongoing medical and medication management to treat the patient's condition.  The patient has been placed under full IVC at this time.   ----------------------------------------- 11:20 PM on 09/12/2022 -----------------------------------------  I consulted and discussed the case with NP Dixon from psychiatry who has evaluated the patient and recommends inpatient psychiatric admission.  FINAL CLINICAL IMPRESSION(S) / ED DIAGNOSES   Final diagnoses:  Suicidal ideation     Rx / DC Orders   ED Discharge Orders     None        Note:  This document was prepared using Dragon voice recognition software and may include unintentional dictation errors.    Dionne Bucy, MD 09/12/22 1610    Dionne Bucy, MD 09/12/22 2322

## 2022-09-12 NOTE — BH Assessment (Signed)
Comprehensive Clinical Assessment (CCA) Note  09/12/2022 Deborah Mckee [id number]  Chief Complaint: Patient is a 22 year old female presenting to [facility] ED under IVC. Per triage note Pt to ED from home for psych complaints. Pt states :" I want to die and I am going to do it if I dont get help". Pt is CAOx4, in no acute distress and ambulatory in triage.  Pt advised she does have psych HX. She also has addiction. She has meds but doesn't take them bc she is Bipolar. During assessment patient appears alert and oriented x4, calm and cooperative, mood appears depressed and patient is tearful during assessment. Patient reports "I'm very sad, I want to die, my poppa died a few days ago, and I'm an adult not and it's been hard." Patient reports consistent SI throughout her life, she reports 1 past attempt at 22 years old "I tried to suffocate myself." Patient also reports a past history of hospitalizations for her depression. Patient currently has a outpatient provider with [facility] but she reports that she stopped taking her medications "my psychiatrist doesn't do her job." Patient reports a history of cocaine and alcohol use, she reports prior to using that she was more than 200 days clean and sober. Patient's current UDS is positive for Cocaine and Cannabinoids, BAL is 29. Patient reports continued SI, denies HI/AH/VH/.  Per Psyc NP Lerry Liner patient is recommended for Inpatient Chief Complaint  Patient presents with   Psychiatric Evaluation   Visit Diagnosis: Bipolar affective disorder, Cocaine abuse    CCA Screening, Triage and Referral (STR)  Patient Reported Information How did you hear about [place]? Legal System  Referral name: No data recorded Referral phone number: No data recorded  Whom do you see for routine medical problems? No data recorded Practice/Facility Name: No data recorded Practice/Facility Phone Number: No data recorded Name of Contact: No data recorded Contact Number:  No data recorded Contact Fax Number: No data recorded Prescriber Name: No data recorded Prescriber Address (if known): No data recorded  What Is the Reason for Your Visit/Call Today? Pt to ED from home for psych complaints. Pt states :" I want to die and I am going to do it if I dont get help". Pt is CAOx4, in no acute distress and ambulatory in triage.   Pt advised she does have psych HX. She also has addiction. She has meds but doesn't take them bc she is Bipolar.  How Long Has This Been Causing You Problems? > than 6 months  What Do You Feel Would Help You the Most Today? Treatment for Depression or other mood problem   Have You Recently Been in Any Inpatient Treatment (Hospital/Detox/Crisis Center/28-Day Program)? No data recorded Name/Location of Program/Hospital:No data recorded How Long Were You There? No data recorded When Were You Discharged? No data recorded  Have You Ever Received Services From [facility] Before? No data recorded Who Do You See at [facility]? No data recorded  Have You Recently Had Any Thoughts About Hurting Yourself? Yes  Are You Planning to Commit Suicide/Harm Yourself At This time? No   Have you Recently Had Thoughts About Hurting Someone Karolee Ohs? No  Explanation: No data recorded  Have You Used Any Alcohol or Drugs in the Past 24 Hours? Yes  How Long Ago Did You Use Drugs or Alcohol? No data recorded What Did You Use and How Much? Alcohol, Cocaine   Do You Currently Have a Therapist/Psychiatrist? Yes  Name of Therapist/Psychiatrist:  [facility]   Have You Been Recently Discharged From Any Office Practice or Programs? No  Explanation of Discharge From Practice/Program: No data recorded    CCA Screening Triage Referral Assessment Type of Contact: Face-to-Face  Is this Initial or Reassessment? No data recorded Date Telepsych consult ordered in [facility]:  No data recorded Time Telepsych consult ordered in [facility]:  No data  recorded  Patient Reported Information Reviewed? No data recorded Patient Left Without Being Seen? No data recorded Reason for Not Completing Assessment: No data recorded  Collateral Involvement: No data recorded  Does Patient Have a Court Appointed Legal Guardian? No data recorded Name and Contact of Legal Guardian: No data recorded If Minor and Not Living with Parent(s), Who has Custody? No data recorded Is CPS involved or ever been involved? Never  Is APS involved or ever been involved? Never   Patient Determined To Be At Risk for Harm To Self or Others Based on Review of Patient Reported Information or Presenting Complaint? No  Method: No data recorded Availability of Means: No data recorded Intent: No data recorded Notification Required: No data recorded Additional Information for Danger to Others Potential: No data recorded Additional Comments for Danger to Others Potential: No data recorded Are There Guns or Other Weapons in Your Home? No  Types of Guns/Weapons: No data recorded Are These Weapons Safely Secured?                            No data recorded Who Could Verify You Are Able To Have These Secured: No data recorded Do You Have any Outstanding Charges, Pending Court Dates, Parole/Probation? No data recorded Contacted To Inform of Risk of Harm To Self or Others: No data recorded  Location of Assessment: [facility] ED   Does Patient Present under Involuntary Commitment? No data recorded IVC Papers Initial File Date: No data recorded  [place] of Residence: Deborah Mckee   Patient Currently Receiving the Following Services: Medication Management   Determination of Need: Emergent (2 hours)   Options For Referral: No data recorded    CCA Biopsychosocial Intake/Chief Complaint:  No data recorded Current Symptoms/Problems: No data recorded  Patient Reported Schizophrenia/Schizoaffective Diagnosis in Past: No   Strengths: Patient is able to communicate and care for  herself  Preferences: No data recorded Abilities: No data recorded  Type of Services Patient Feels are Needed: No data recorded  Initial Clinical Notes/Concerns: No data recorded  Mental Health Symptoms Depression:   Change in energy/activity; Difficulty Concentrating; Fatigue; Tearfulness   Duration of Depressive symptoms:  Greater than two weeks   Mania:   None   Anxiety:    Difficulty concentrating; Fatigue; Restlessness; Sleep   Psychosis:   None   Duration of Psychotic symptoms: No data recorded  Trauma:   None   Obsessions:   None   Compulsions:   None   Inattention:   None   Hyperactivity/Impulsivity:   None   Oppositional/Defiant Behaviors:   None   Emotional Irregularity:   None   Other Mood/Personality Symptoms:  No data recorded   Mental Status Exam Appearance and self-care  Stature:   Average   Weight:   Average weight   Clothing:   Casual   Grooming:   Normal   Cosmetic use:   None   Posture/gait:   Normal   Motor activity:   Not Remarkable   Sensorium  Attention:   Normal   Concentration:  Normal   Orientation:   X5   Recall/memory:   Normal   Affect and Mood  Affect:   Appropriate   Mood:   Depressed   Relating  Eye contact:   Normal   Facial expression:   Responsive   Attitude toward examiner:   Cooperative   Thought and Language  Speech flow:  Clear and Coherent   Thought content:   Appropriate to Mood and Circumstances   Preoccupation:   None   Hallucinations:   None   Organization:  No data recorded  [employer] of Knowledge:   Fair   Intelligence:   Average   Abstraction:   Normal   Judgement:   Fair   Dance movement psychotherapist:   Adequate   Insight:   Fair   Decision Making:   Normal   Social Functioning  Social Maturity:   Responsible   Social Judgement:   Normal   Stress  Stressors:   Housing; Office manager Ability:   Advertising account executive Deficits:   None   Supports:   Family; Friends/Service system     Religion: Religion/Spirituality Are You A Religious Person?: No  Leisure/Recreation: Leisure / Recreation Do You Have Hobbies?: No  Exercise/Diet: Exercise/Diet Do You Exercise?: No Have You Gained or Lost A Significant Amount of Weight in the Past Six Months?: No Do You Follow a Special Diet?: No Do You Have Any Trouble Sleeping?: No   CCA Employment/Education Employment/Work Situation: Employment / Work Situation Employment Situation: Employed Work Stressors: None reported Has Patient ever Been in Equities trader?: No  Education: Education Is Patient Currently Attending School?: No Did You Have An Individualized Education Program (IIEP): No Did You Have Any Difficulty At [employer]?: No Patient's Education Has Been Impacted by Current Illness: No   CCA Family/Childhood History Family and Relationship History: Family history Marital status: Long term relationship Long term relationship, how long?: Unknown What types of issues is patient dealing with in the relationship?: No reported issues Additional relationship information: None Does patient have children?: No  Childhood History:  Childhood History By whom was/is the patient raised?: Father Did patient suffer any verbal/emotional/physical/sexual abuse as a child?: No Did patient suffer from severe childhood neglect?: No Has patient ever been sexually abused/assaulted/raped as an adolescent or adult?: No Was the patient ever a victim of a crime or a disaster?: No Witnessed domestic violence?: No Has patient been affected by domestic violence as an adult?: No  Child/Adolescent Assessment:     CCA Substance Use Alcohol/Drug Use: Alcohol / Drug Use Pain Medications: see mar Prescriptions: see mar Over the Counter: see mar History of alcohol / drug use?: Yes Substance #1 Name of Substance 1: Alcohol 1 - Age of First Use: Unknown 1  - Amount (size/oz): Unknow amounts 1 - Last Use / Amount: 09/12/22 Substance #2 Name of Substance 2: Cocaine 2 - Frequency: Patient reports that she was 200 + days sober prior to today 2 - Last Use / Amount: 09/12/22                     ASAM's:  Six Dimensions of Multidimensional Assessment  Dimension 1:  Acute Intoxication and/or Withdrawal Potential:      Dimension 2:  Biomedical Conditions and Complications:      Dimension 3:  Emotional, Behavioral, or Cognitive Conditions and Complications:     Dimension 4:  Readiness to Change:     Dimension 5:  Relapse,  Continued use, or Continued Problem Potential:     Dimension 6:  Recovery/Living Environment:     ASAM Severity Score:    ASAM Recommended Level of Treatment:     Substance use Disorder (SUD) Substance Use Disorder (SUD)  Checklist Symptoms of Substance Use: Continued use despite having a persistent/recurrent physical/psychological problem caused/exacerbated by use, Continued use despite persistent or recurrent social, interpersonal problems, caused or exacerbated by use, Persistent desire or unsuccessful efforts to cut down or control use, Presence of craving or strong urge to use, Recurrent use that results in a failure to fulfill major role obligations (work, school, home)  Recommendations for Services/Supports/Treatments: Recommendations for Services/Supports/Treatments Recommendations For Services/Supports/Treatments: Inpatient Hospitalization  DSM5 Diagnoses: Patient Active Problem List   Diagnosis Date Noted   Bipolar affective disorder, current episode mixed (HCC) 02/11/2021   Cocaine abuse (HCC) 02/11/2021   Chronic post-traumatic stress disorder 04/18/2018   Cannabis abuse 04/18/2018   Disruptive mood dysregulation disorder (HCC) 03/30/2018    Patient Centered Plan: Patient is on the following Treatment Plan(s):  Depression and Substance Abuse   Referrals to Alternative Service(s): Referred to  Alternative Service(s):   Place:   Date:   Time:    Referred to Alternative Service(s):   Place:   Date:   Time:    Referred to Alternative Service(s):   Place:   Date:   Time:    Referred to Alternative Service(s):   Place:   Date:   Time:      @BHCOLLABOFCARE @  [employer], LCAS-A

## 2022-09-12 NOTE — ED Notes (Signed)
Pt given snack , pt declined drink because she already had one.

## 2022-09-12 NOTE — ED Triage Notes (Signed)
Pt to ED from home for psych complaints. Pt states :" I want to die and I am going to do it if I dont get help". Pt is CAOx4, in no acute distress and ambulatory in triage.  Pt advised she does have psych HX. She also has addiction. She has meds but doesn't take them bc she is Bipolar.

## 2022-09-12 NOTE — ED Notes (Signed)
TTS speaking with patient. 

## 2022-09-12 NOTE — Consult Note (Signed)
[facility] Face-to-Face Psychiatry Consult   Reason for Consult:  Psychiatric evaluation Referring Physician:  Dr. Marisa Severin Patient Identification: Deborah Mckee MRN:  [id number] Principal Diagnosis: Bipolar affective disorder, current episode mixed (HCC) Diagnosis:  Principal Problem:   Bipolar affective disorder, current episode mixed (HCC) Active Problems:   Disruptive mood dysregulation disorder (HCC)   Cocaine abuse (HCC)   Total Time spent with patient: 45 minutes  Subjective:     HPI:  Psych Assessment  Deborah Mckee, 22 y.o., female patient seen by TTS and this provider; chart reviewed and consulted with Dr. Marisa Severin on 09/12/22.  On evaluation Deborah Mckee reports  During evaluation Deborah LEYMAN is laying on the bed; he is alert/oriented x 4; depressed/calm/cooperative; and mood congruent with affect.  Patient is speaking in a clear tone at moderate volume, and normal pace; with good eye contact.  Her thought process is coherent and relevant; There is no indication that she is currently responding to internal/external stimuli or experiencing delusional thought content.  Patient denies suicidal/self-harm/homicidal ideation, psychosis, and paranoia.  Patient has remained calm throughout assessment and has answered questions appropriately.    Past Psychiatric History: Bipolar  Risk to Self:   Risk to Others:   Prior Inpatient Therapy:   Prior Outpatient Therapy:    Past Medical History:  Past Medical History:  Diagnosis Date   Anxiety    Chlamydia    Depression    History of cocaine abuse (HCC)    Vaccine for human papilloma virus (HPV) types 6, 11, 16, and 18 administered    Vision abnormalities    wears glasses    Past Surgical History:  Procedure Laterality Date   INGUINAL HERNIA PEDIATRIC WITH LAPAROSCOPIC EXAM Left 04/22/2017   Procedure: INGUINAL HERNIA PEDIATRIC WITH LAP LOOK OF PELVIC FLOOR;  Surgeon: Leonia Corona, MD;  Location: [facility];   Service: Pediatrics;  Laterality: Left;   INGUINAL HERNIA REPAIR Right 2008   Family History: History reviewed. No pertinent family history. Family Psychiatric  History: unknown Social History:  Social History   Substance and Sexual Activity  Alcohol Use No     Social History   Substance and Sexual Activity  Drug Use Yes   Types: Marijuana   Comment: social    Social History   Socioeconomic History   Marital status: Single    Spouse name: Not on file   Number of children: Not on file   Years of education: Not on file   Highest education level: Not on file  Occupational History   Not on file  Tobacco Use   Smoking status: Every Day    Types: E-cigarettes   Smokeless tobacco: Never  Vaping Use   Vaping Use: Every day   Substances: Nicotine, Synthetic cannabinoids  Substance and Sexual Activity   Alcohol use: No   Drug use: Yes    Types: Marijuana    Comment: social   Sexual activity: Yes    Birth control/protection: None    Comment: same sex partner  Other Topics Concern   Not on file  Social History Narrative   Not on file   Social Determinants of Health   Financial Resource Strain: Not on file  Food Insecurity: Not on file  Transportation Needs: Not on file  Physical Activity: Not on file  Stress: Not on file  Social Connections: Not on file   Additional Social History:    Allergies:   Allergies  Allergen Reactions   Penicillins  Rash and Other (See Comments)    Labs:  Results for orders placed or performed during the hospital encounter of 09/12/22 (from the past 48 hour(s))  Comprehensive metabolic panel     Status: Abnormal   Collection Time: 09/12/22  6:04 PM  Result Value Ref Range   Sodium 138 135 - 145 mmol/L   Potassium 3.2 (L) 3.5 - 5.1 mmol/L   Chloride 104 98 - 111 mmol/L   CO2 25 22 - 32 mmol/L   Glucose, Bld 105 (H) 70 - 99 mg/dL    Comment: Glucose reference range applies only to samples taken after fasting for at least 8 hours.    BUN 11 6 - 20 mg/dL   Creatinine, Ser 1.61 0.44 - 1.00 mg/dL   Calcium 8.7 (L) 8.9 - 10.3 mg/dL   Total Protein 6.9 6.5 - 8.1 g/dL   Albumin 4.2 3.5 - 5.0 g/dL   AST 16 15 - 41 U/L   ALT 9 0 - 44 U/L   Alkaline Phosphatase 65 38 - 126 U/L   Total Bilirubin 0.8 0.3 - 1.2 mg/dL   GFR, Estimated >09 >60 mL/min    Comment: (NOTE) Calculated using the CKD-EPI Creatinine Equation (2021)    Anion gap 9 5 - 15    Comment: Performed at [facility], [address] [place], [place] [address]  Ethanol     Status: Abnormal   Collection Time: 09/12/22  6:04 PM  Result Value Ref Range   Alcohol, Ethyl (B) 29 (H) <10 mg/dL    Comment: (NOTE) Lowest detectable limit for serum alcohol is 10 mg/dL.  For medical purposes only. Performed at [facility], [address] [place], [place] [address]   Salicylate level     Status: Abnormal   Collection Time: 09/12/22  6:04 PM  Result Value Ref Range   Salicylate Lvl <7.0 (L) 7.0 - 30.0 mg/dL    Comment: Performed at [facility], [address] [place], [place] [address]  Acetaminophen level     Status: Abnormal   Collection Time: 09/12/22  6:04 PM  Result Value Ref Range   Acetaminophen (Tylenol), Serum <10 (L) 10 - 30 ug/mL    Comment: (NOTE) Therapeutic concentrations vary significantly. A range of 10-30 ug/mL  may be an effective concentration for many patients. However, some  are best treated at concentrations outside of this range. Acetaminophen concentrations >150 ug/mL at 4 hours after ingestion  and >50 ug/mL at 12 hours after ingestion are often associated with  toxic reactions.  Performed at [facility], [address] [place], [place] [address]   cbc     Status: None   Collection Time: 09/12/22  6:04 PM  Result Value Ref Range   WBC 6.1 4.0 - 10.5 K/uL   RBC 5.03 3.87 - 5.11 MIL/uL   Hemoglobin 14.7 12.0 - 15.0 g/dL   HCT 08.6 57.8 - 46.9 %   MCV 87.7 80.0 - 100.0 fL   MCH 29.2  26.0 - 34.0 pg   MCHC 33.3 30.0 - 36.0 g/dL   RDW 62.9 52.8 - 41.3 %   Platelets 192 150 - 400 K/uL   nRBC 0.0 0.0 - 0.2 %    Comment: Performed at [facility], [address] [place], [place] [address]  Urine Drug Screen, Qualitative     Status: Abnormal   Collection Time: 09/12/22  6:04 PM  Result Value Ref Range   Tricyclic, Ur Screen NONE DETECTED  NONE DETECTED   Amphetamines, Ur Screen NONE DETECTED NONE DETECTED   MDMA (Ecstasy)Ur Screen NONE DETECTED NONE DETECTED   Cocaine Metabolite,Ur [place] POSITIVE (A) NONE DETECTED   Opiate, Ur Screen NONE DETECTED NONE DETECTED   Phencyclidine (PCP) Ur S NONE DETECTED NONE DETECTED   Cannabinoid 50 Ng, Ur [place] POSITIVE (A) NONE DETECTED   Barbiturates, Ur Screen NONE DETECTED NONE DETECTED   Benzodiazepine, Ur Scrn NONE DETECTED NONE DETECTED   Methadone Scn, Ur NONE DETECTED NONE DETECTED    Comment: (NOTE) Tricyclics + metabolites, urine    Cutoff 1000 ng/mL Amphetamines + metabolites, urine  Cutoff 1000 ng/mL MDMA (Ecstasy), urine              Cutoff 500 ng/mL Cocaine Metabolite, urine          Cutoff 300 ng/mL Opiate + metabolites, urine        Cutoff 300 ng/mL Phencyclidine (PCP), urine         Cutoff 25 ng/mL Cannabinoid, urine                 Cutoff 50 ng/mL Barbiturates + metabolites, urine  Cutoff 200 ng/mL Benzodiazepine, urine              Cutoff 200 ng/mL Methadone, urine                   Cutoff 300 ng/mL  The urine drug screen provides only a preliminary, unconfirmed analytical test result and should not be used for non-medical purposes. Clinical consideration and professional judgment should be applied to any positive drug screen result due to possible interfering substances. A more specific alternate chemical method must be used in order to obtain a confirmed analytical result. Gas chromatography / mass spectrometry (GC/MS) is the preferred confirm atory method. Performed at [facility], [address] [place], [place] [address]   POC urine preg, ED     Status: None   Collection Time: 09/12/22  6:33 PM  Result Value Ref Range   Preg Test, Ur NEGATIVE NEGATIVE    Comment:        THE SENSITIVITY OF THIS METHODOLOGY IS >24 mIU/mL     Current Facility-Administered Medications  Medication Dose Route Frequency Provider Last Rate Last Admin   ARIPiprazole (ABILIFY) tablet 20 mg  20 mg Oral Daily Dionne Bucy, MD   20 mg at 09/12/22 2143   lithium carbonate (ESKALITH) ER tablet 450 mg  450 mg Oral QHS Dionne Bucy, MD   450 mg at 09/12/22 2147   lithium carbonate (LITHOBID) ER tablet 300 mg  300 mg Oral QHS Dionne Bucy, MD   300 mg at 09/12/22 2144   medroxyPROGESTERone (DEPO-PROVERA) injection 150 mg  150 mg Intramuscular Q90 days Copland, Alicia B, PA-C   150 mg at 02/23/22 0834   traZODone (DESYREL) tablet 100 mg  100 mg Oral QHS Dionne Bucy, MD   100 mg at 09/12/22 2143   Current Outpatient Medications  Medication Sig Dispense Refill   diazepam (VALIUM) 5 MG tablet Take 2.5-5 mg by mouth daily as needed.     lithium carbonate (ESKALITH) 450 MG ER tablet Take 450 mg by mouth at bedtime.     lithium carbonate (LITHOBID) 300 MG CR tablet 300 mg at bedtime. TAKE 1 TABLET BY MOUTH NIGHTLY (ALONG WITH 450 MG     ARIPiprazole (ABILIFY) 20 MG tablet      fluconazole (DIFLUCAN) 150 MG tablet 1 PO x 1 dose. May  repeat in 3 days if needed (Patient not taking: Reported on 09/12/2022) 2 tablet 0   lisdexamfetamine (VYVANSE) 40 MG capsule Take 40 mg by mouth every morning. (Patient not taking: Reported on 09/12/2022)     nicotine (NICODERM CQ - DOSED IN MG/24 HOURS) 21 mg/24hr patch Place 1 patch (21 mg total) onto the skin daily. (Patient not taking: Reported on 11/13/2021) 30 patch 0    Musculoskeletal: Strength & Muscle Tone: within normal limits Gait & Station: normal Patient leans: N/A  Psychiatric Specialty Exam:  Presentation  General Appearance:  Appropriate for Environment  Eye Contact:Fair  Speech:Clear and Coherent  Speech Volume:Decreased  Handedness:Right   Mood and Affect  Mood:Anxious; Depressed  Affect:Depressed; Flat   Thought Process  Thought Processes:Coherent  Descriptions of Associations:Intact  Orientation:Full (Time, Place and Person)  Thought Content:WDL  History of Schizophrenia/Schizoaffective disorder:No  Duration of Psychotic Symptoms:No data recorded Hallucinations:Hallucinations: None  Ideas of Reference:None  Suicidal Thoughts:Suicidal Thoughts: Yes, Passive  Homicidal Thoughts:Homicidal Thoughts: No   Sensorium  Memory:Recent Fair; Immediate Fair  Judgment:Fair  Insight:Fair   Executive Functions  Concentration:Fair  Attention Span:Fair  Recall:Fair  Fund of Knowledge:Fair  Language:Fair   Psychomotor Activity  Psychomotor Activity:Psychomotor Activity: Increased   Assets  Assets:Communication Skills; Desire for Improvement; Financial Resources/Insurance; Resilience; Social Support   Sleep  Sleep:Sleep: Fair   Physical Exam: Physical Exam Vitals and nursing note reviewed.  Constitutional:      Appearance: Normal appearance.  HENT:     Head: Normocephalic and atraumatic.     Nose: Nose normal.     Mouth/Throat:     Mouth: Mucous membranes are moist.  Eyes:     Pupils: Pupils are equal, round, and reactive to light.  Pulmonary:     Effort: Pulmonary effort is normal.  Musculoskeletal:        General: Normal range of motion.  Skin:    General: Skin is dry.  Neurological:     Mental Status: She is alert and oriented to person, place, and time.  Psychiatric:        Attention and Perception: Attention and perception normal.        Mood and Affect: Mood is anxious and depressed.        Speech: Speech normal.        Behavior: Behavior normal. Behavior is cooperative.        Thought Content: Thought content includes suicidal ideation. Thought content  does not include suicidal plan.        Judgment: Judgment normal.    Review of Systems  Psychiatric/Behavioral:  Positive for depression, substance abuse and suicidal ideas. Negative for hallucinations. The patient is nervous/anxious.   All other systems reviewed and are negative.  Blood pressure 117/81, pulse 92, temperature 98.6 F (37 C), temperature source Oral, resp. rate 16, height 5\' 7"  (1.702 m), weight 53.5 kg, SpO2 98 %. Body mass index is 18.48 kg/m.  Treatment Plan Summary: Daily contact with patient to assess and evaluate symptoms and progress in treatment, Medication management, and Plan Inpatient SHWANDA BENYO was admitted to [facility] under the service of Dionne Bucy, MD for Bipolar affective disorder, current episode mixed [facility]), crisis management, and stabilization. Routine labs ordered, which include Lab Orders         Comprehensive metabolic panel         Ethanol         Salicylate level         Acetaminophen level  cbc         Urine Drug Screen, Qualitative         POC urine preg, ED    Medication Management: Medications started  ARIPiprazole  20 mg Oral Daily   lithium carbonate  450 mg Oral QHS   lithium carbonate  300 mg Oral QHS   medroxyPROGESTERone  150 mg Intramuscular Q90 days   traZODone  100 mg Oral QHS   Will maintain observation checks every 15 minutes for safety. Psychosocial education regarding relapse prevention and self-care; social and communication  Social work will consult with family for collateral information and discuss discharge and follow up plan. Disposition: Recommend psychiatric Inpatient admission when medically cleared. Supportive therapy provided about ongoing stressors. Discussed crisis plan, support from social network, calling 911, coming to the Emergency Department, and calling Suicide Hotline.  Jearld Lesch, NP 09/12/2022 10:44 PM

## 2022-09-12 NOTE — ED Notes (Addendum)
Pt dressed out :  1 pair of brown slippers 1 black tshirt 1 pair of blue cookie monster pants 1 pair of white socks 1 yellow metal earring 1 belly ring 1 grey bra 1 black underwear  Pt will leave her cell phone, wallet and her bag of meds with her boyfriend.

## 2022-09-13 LAB — SARS CORONAVIRUS 2 BY RT PCR: SARS Coronavirus 2 by RT PCR: NEGATIVE

## 2022-09-13 MED ORDER — LORAZEPAM 1 MG PO TABS
1.0000 mg | ORAL_TABLET | Freq: Once | ORAL | Status: AC
  Administered 2022-09-13: 1 mg via ORAL
  Filled 2022-09-13: qty 1

## 2022-09-13 MED ORDER — POTASSIUM CHLORIDE CRYS ER 20 MEQ PO TBCR
40.0000 meq | EXTENDED_RELEASE_TABLET | Freq: Once | ORAL | Status: AC
  Administered 2022-09-13: 40 meq via ORAL
  Filled 2022-09-13: qty 2

## 2022-09-13 NOTE — BH Assessment (Signed)
Per [facility] AC Alcario Drought), patient to be referred out of system.  Referral information for Psychiatric Hospitalization faxed to;   [facility] [phone]- [phone]) No available beds  [facility] [phone]- [phone]),   [facility] [phone]),  [address] [phone]),   [facility] [phone] -or- [phone]),   [facility] [phone]),  [place] [phone])  [facility] [phone])

## 2022-09-13 NOTE — ED Notes (Signed)
Patient up to nsg desk, asking why she has to be in the hallway that she can't sleep and its not doing her any good.  Explained that staff agreed it was not ideal but that there were no available beds at this time.

## 2022-09-13 NOTE — ED Provider Notes (Signed)
Emergency Medicine Observation Re-evaluation Note  Deborah Mckee is a 22 y.o. female, seen on rounds today.  Pt initially presented to the ED for complaints of Psychiatric Evaluation Currently, the patient is calm,resting.  Physical Exam  BP 117/81   Pulse 92   Temp 98.6 F (37 C) (Oral)   Resp 16   Ht 5\' 7"  (1.702 m)   Wt 53.5 kg   SpO2 98%   BMI 18.48 kg/m  ED Course / MDM  EKG:   I have reviewed the labs performed to date as well as medications administered while in observation.  Recent changes in the last 24 hours include None.  Plan  Current plan is for psych transfer to outside facility. EMTALA completed. Pt medically stable and in NAD.Shaune Pollack, MD 09/13/22 [phone]

## 2022-09-13 NOTE — ED Notes (Signed)
Belongings bag 1/1 sent with patient at time of discharge 

## 2022-09-13 NOTE — BH Assessment (Signed)
PATIENT BED AVAILABLE AFTER 9AM ON 09/13/22  Patient has been accepted to [facility].  Patient assigned to room [facility] Accepting physician is Dr. Roselyn Reef.  Call report to [phone].  Representative was [employer].   ER Staff is aware of it:  Melody ER Secretary  Dr. Delton Prairie, ER MD  [facility] Patient's Nurse       Address: [address], [place]

## 2022-09-13 NOTE — ED Notes (Signed)
EMTALA reviewed by this RN at this time.  

## 2023-04-25 NOTE — ED Provider Notes (Signed)
 ------------------------------------------------------------------------------- Attestation signed by Therisa KANDICE Silvan, MD at 04/26/23 1330 I attest that I am the attending provider for this patient and have reviewed the medical decision-making and plan as part of ongoing quality measures.  I was physically present in the department and available for consultation.  This case was not discussed with me during the patient's time in the ED.  -------------------------------------------------------------------------------   [facility]  ED Provider Note  Deborah Mckee 23 y.o. female DOB: February 21, 2001 MRN: [id number]  Tele-Medical screening initiated and orders placed by DOROTHA JAYSON Simpers, PA. 04/25/2023 / 2:13 PM  23 y.o. female LMP 03/26/2023 presents with 2 positive pregnancy tests at home.  This is her first pregnancy.  Reports suprapubic abdominal cramping that feels like menstrual cycle on steroids.  Denies vaginal bleeding.  No other complaints. VSS, NAD, alert, answering questions appropriately.  Calm and in good spirits.  Labs, point-of-care hCG, qualitative hCG, OB US  ordered.  Patient seen and received a tele-medical screening examination in triage.  The provider performing the medical screening exam was not located at the facility and was located remotely.  Patient understands that the provider is seeing them remotely and consents to the exam.  Appropriate orders have been initiated based on my brief physical exam and HPI. Patient placed in appropriate area until a treatment room becomes available for further evaluation and management by the in-house provider.  This tele-medical screening exam was electronically signed by DOROTHA JAYSON Simpers, PA on 04/25/2023 at 2:13 PM  History   Chief Complaint  Patient presents with  . Possible Pregnancy    Pt took two pregnancy test this morning and both positive. Lower abd pain, no bleeding. Last period month ago.    23 y.o.  female LMP 03/26/2023 presents with 2 positive pregnancy tests at home.  This is her first pregnancy.  Reports suprapubic abdominal cramping that feels like menstrual cycle on steroids.  Denies vaginal bleeding.  No other complaints.  She does not currently have a gynecologist as she recently moved to the area.        No past medical history on file.  No past surgical history on file.  Social History   Substance and Sexual Activity  Alcohol Use Not on file   Social History   Tobacco Use  Smoking Status Not on file  Smokeless Tobacco Not on file   E-Cigarettes  . Vaping Use    . Start Date    . Cartridges/Day    . Quit Date     Social History   Substance and Sexual Activity  Drug Use Not on file         Allergies  Allergen Reactions  . Penicillins Rash    Home Medications   ARIPIPRAZOLE  (ABILIFY ) 30 MG TABLET    Take one tablet (30 mg dose) by mouth at bedtime.   HYDROXYZINE  PAMOATE (VISTARIL ) 25 MG CAPSULE    Take one capsule (25 mg dose) by mouth as needed for Anxiety. TAKE ONE CAPSULE EVERY 6 HOURS AS NEEDED FOR ANXIETY   IBUPROFEN  (ADVIL ,MOTRIN ) 200 MG TABLET    Take two tablets (400 mg dose) by mouth every 6 (six) hours as needed for Pain.   LAMOTRIGINE  (LAMICTAL ) 200 MG TABLET    Take one tablet (200 mg dose) by mouth daily.   LITHIUM  CARBONATE (LITHOBID ) 300 MG ER TABLET    Take two tablets (600 mg dose) by mouth at bedtime.   ONDANSETRON  (ZOFRAN -ODT) 4 MG DISINTEGRATING TABLET  Take one tablet (4 mg dose) by mouth every 8 (eight) hours as needed.   TRAZODONE  (DESYREL ) 50 MG TABLET    Take one tablet (50 mg dose) by mouth at bedtime.   VRAYLAR 1.5 MG CAPSULE    Take one capsule (1.5 mg dose) by mouth daily.    Primary Survey  Primary Survey  Review of Systems   Review of Systems  Constitutional:  Negative for chills and fever.  Gastrointestinal:  Negative for abdominal pain, nausea and vomiting.  Genitourinary:  Positive for menstrual problem and  pelvic pain. Negative for vaginal bleeding and vaginal discharge.  Skin:  Negative for rash and wound.  Allergic/Immunologic: Negative for immunocompromised state.  Neurological:  Negative for headaches.  Hematological:  Negative for adenopathy. Does not bruise/bleed easily.  All other systems reviewed and are negative.   Physical Exam   ED Triage Vitals [04/25/23 1348]  BP 127/80  Heart Rate 53  Resp 18  SpO2 99 %  Temp 97.6 F (36.4 C)    Physical Exam  Constitutional: She appears well-developed and well-nourished. She is in good hygiene. She has good hygiene. She does not appear distressed, does not appear ill and no respiratory distress. Not diaphoretic. HENT:  Head: Normocephalic and atraumatic.  Right Ear: Normal external ear.  Left Ear: Normal external ear.  Nose: Nose normal.  Mouth/Throat: Voice normal.  Eyes: EOM are intact. Pupils are equal, round, and reactive to light. Right eye: no drainage. no conjunctival injection. Left eye: no drainage. no conjunctival injection. No scleral icterus.  Neck: Normal range of motion and voice normal. Normal range of motion.  Cardiovascular: Normal rate.  Pulmonary/Chest: No respiratory distress. Not tachypneic. Respiratory effort normal.  Abdominal: Soft. There is no abdominal tenderness. There is no guarding and no rebound. Abdomen not distended. Bowel sounds are normal.  Genitourinary:    Genitourinary Comments: No needed   Musculoskeletal: Normal range of motion.     Cervical back: Normal range of motion. Normal range of motion.   Neurological: She is alert and oriented to person, place, and time. Gait normal. She has normal speech. No facial weakness.Gait normal. GCS Total Score = 15.  , eye opening = 4 , verbal response = 5 , best motor response = 6 , pupil unreactive to light = 0  no facial droop.  Skin: Skin is not clammy. Skin is warm. Not diaphoretic. Skin is dry. No rash noted. No pallor.  No cyanosis.  No obvious rash,  abrasions/lacerations or lesions to exposed areas of skin.   Psychiatric: She has a normal mood and affect. Her speech is normal. Her behavior is normal.    ED Course   Lab results:   COMPREHENSIVE METABOLIC PANEL - Abnormal      Result Value   Na 139     Potassium 4.2     Cl 105     CO2 23     AGAP 11     Glucose 92     BUN 6     Creatinine 0.81     Ca 9.1     ALK PHOS 81     T Bili <0.2     Total Protein 6.3     Alb 4.2     GLOBULIN 2.1     ALBUMIN/GLOBULIN RATIO 2.0     BUN/CREAT RATIO 7.4 (*)    ALT 10     AST 17     eGFR 105     Comment: Normal  GFR (glomerular filtration rate) > 60 mL/min/1.73 meters squared, < 60 may include impaired kidney function. Calculation based on the Chronic Kidney Disease Epidemiology Collaboration (CK-EPI)equation refit without adjustment for race.  URINALYSIS W/MICRO REFLEX CULTURE - SYMPTOMATIC - Abnormal   Urine Color Yellow     Urine Clarity Clear     Urine Specific Gravity 1.022     Urine pH 5.5     Urine Protein - Dipstick 10 (*)    Urine Glucose Negative     Urine Ketones Negative     Urine Bilirubin Negative     Urine Blood Negative     Urine Nitrite Negative     Urine Urobilinogen <2     Urine Leukocyte Esterase Negative     UA Microscopic No Micro     Narrative:    Does not meet criteria for reflex to Urine Culture.  POCT HCG - Abnormal   POC HCG Positive (*)    Lot Number [id number]     Expiration Date 04/04/24     INTERNAL QC CHECK PERFORMED Acceptable    CBC AND DIFFERENTIAL   WBC 6.7     RBC 4.66     HGB 13.7     HCT 42.2     MCV 90.6     MCH 29.4     MCHC 32.5     Plt Ct 166     RDW SD 43.2     MPV 10.8     NRBC% 0.0     Absolute NRBC Count 0.00     NEUTROPHIL % 63.7     LYMPHOCYTE % 26.6     MONOCYTE % 4.3     Eosinophil % 4.6     BASOPHIL % 0.4     IG% 0.4     ABSOLUTE NEUTROPHIL COUNT 4.25     ABSOLUTE LYMPHOCYTE COUNT 1.78     Absolute Monocyte Count 0.29     Absolute Eosinophil Count 0.31      Absolute Basophil Count 0.03     Absolute Immature Granulocyte Count 0.03    HCG, QUANTITATIVE   B HCG Quant 1,325     Comment: Female -  0 - 3 mIU/mL  Female Non-pregnant -   0 - 5 mIU/mL  Female Postmenopausal  - 0 - 8 mIU/mL  Female Pregnant by week of gestation: 3 weeks-   6 - 71 mIU/mL 4 weeks-   10 - 750 mIU/mL 5 weeks-  217 - 7138 mIU/mL 6 weeks-  158 - 31795 mIU/mL 7 weeks- 3697 - 836436 mIU/mL 8 weeks- 67934 - 149571 mIU/mL 9 weeks-  36196 - 151410 mIU/mL 10 weeks- 53490 - 813022 mIU/mL 12 weeks- 72167 - 210612 mIU/mL 14 weeks- 86049 - 62530 mIU/mL 15 weeks- 87960 - 70971 mIU/mL 16 weeks- 9040 - 56451 mIU/mL 17 weeks- 8175 - 55868 mIU/mL 18 weeks- 8099 - 58176 mIU/mL   LIGHT BLUE TOP  GOLD SST    Imaging:   US  OB < 14 WKS TRANSABDOMINAL/TRANSVAGINAL   Narrative:    History: Abdominal Pain   Comparison:  None  Technique:  Transabdominal and transvaginal pelvic ultrasound  Interpretation:  The uterus measures 7.3 x 3.5 x 4.6 cm. There is a questionable 4 mm intrauterine gestational sac without evidence of yolk sac or fetal pole. The right ovary measures 4.4 x 3.2 x 2.7 cm and the left ovary measures 3.4 x 2.8 x 2. The ovaries appear normal. No adnexal mass is localized. There is a small volume  of free pelvic fluid.    Impression:    Impression: Questionable early intrauterine gestational sac. Would recommend correlation with serial beta-hCGs and short interval follow-up pelvic ultrasound  Electronically Signed by: Lynwood Portugal on 04/25/2023 3:48 PM    ECG: ECG Results   None                                                               Pre-Sedation Procedures    Medical Decision Making 23 y.o. female LMP 03/26/2023 presents with 2 positive pregnancy tests at home, coming in today for confirmation. Work-up today was significant for hCG if >1,300. Based on LMP suspect she is about [redacted] weeks gestation. TVUS did reveal what  appears to be a small, early IUP which coordinates with patient's LMP and hCG. Will place a referral to [facility] for patient to get established for appropriate prenatal care. Given strict return precautions regarding new and worsening symptoms, specifically severe pain and vaginal bleeding. Patient voices understanding and is safe for discharge at this time.   Patient has been reexamined and is ready to be discharged.  All diagnostic results have been reviewed and discussed with the patient.  After careful consideration review of the work-up completed today in the setting of patient's known medical problems, presenting problems, etc., I do feel that patient is safe for discharge at this time and is appropriate for outpatient management as opposed to hospitalization.  Care plan has been outlined with patient understands all current diagnoses, results and treatment plans.  There are no new complaints, changes, or physical findings at this time.  All questions have been addressed.  All medications were reviewed with the patient.  The patient has been instructed to and agrees to follow-up with OB, as well as return to the ED upon further deterioration.   Amount and/or Complexity of Data Reviewed Labs: ordered. Decision-making details documented in ED Course. Radiology: ordered. Decision-making details documented in ED Course.  Risk OTC drugs.         Provider Communication  New Prescriptions   No medications on file    Modified Medications   No medications on file    Discontinued Medications   No medications on file    Clinical Impression Final diagnoses:  First trimester pregnancy    ED Disposition     ED Disposition  Discharge   Condition  Stable   Comment  --                 Follow-up Information     Laneta VEAR Hole, MD.   Specialties: Obstetrics, Gynecology, Obstetrics and Gynecology Comments: first trimester pregnancy Contact information: [address] [place] [address] [phone]         [facility] Emergency Department.   Specialty: Emergency Medicine Comments: If symptoms worsen Contact information: [address] [facility] [address]  [address] [phone]                 Electronically signed by:    Charmaine FORBES Blush, PA-C 04/25/23 [phone]

## 2023-04-25 NOTE — ED Notes (Signed)
 Discharge paperwork reviewed, follow up carer discussed, pt verbalized understanding, pt had no further questions

## 2023-05-05 NOTE — Progress Notes (Signed)
  Subjective:    Deborah Mckee is a 23 y.o. (DOB 05-23-2000) female.     Patient presents with  . Follow-up    Sono for D/V, no VB, LOF. Needs Rx for prenatal vitamins   . Medication Refill    Zofran       HPI Deborah Mckee is a 23 year old G1 who presents to GYN clinic for early pregnancy and confirmation.  She has significant nausea and vomiting related to pregnancy.  She has a certain LMP of 03/26/2023.  She presents with her partner.  They are excited and open to pregnancy however were not actively attempting to conceive.  Ultrasound today shows a 5-week 5-day gestation and fetal pole.  Fetal cardiac activity was felt to be visualized by the sonographer however M mode did not give heart rate at this point.  The patient also has several small echogenic foci in the lower pelvis superior to the bladder and lateral.  Cannot rule out fecal lift versus other.  Patient has no personal or family history of cancer, she has no symptoms of ectopic. She notes significant nausea and vomiting with daily emesis.  Reviewed and updated this visit by provider: None       Review of Systems  All other systems reviewed and are negative.     Objective:   Vitals:   05/05/23 1015  BP: 112/78  Height: 5' 7.5 (1.715 m)  Weight: 137 lb (62.1 kg)  BMI (Calculated): 21.1   Physical Exam Vitals and nursing note reviewed.  Constitutional:      Appearance: Normal appearance. She is normal weight.  Neurological:     General: No focal deficit present.     Mental Status: She is alert and oriented to person, place, and time.  Psychiatric:        Mood and Affect: Mood normal.        Behavior: Behavior normal.        Thought Content: Thought content normal.        Judgment: Judgment normal.         Assessment / Plan:   Assessment 1. Early stage of pregnancy (Primary) -     Prenatal Vit-Fe Fumarate-FA (TRINATE) TABS; Take 1 tablet by mouth daily., Starting Wed 05/05/2023, Normal 2. Nausea and  vomiting during pregnancy -     doxylamine-pyridoxine (DICLEGIS) 10-10 mg per DR tablet; Take 2 tabs daily at bedtime. May add 1 tab in morning if symptoms persist, Normal 3. Adnexal cyst    Plan  Given early gestational age and inability to confirm viability will recommend a repeat ultrasound in 1 to 2 weeks to confirm viability.  We will follow-up on echogenic areas at that time.  Likely just prescription was sent in given patient's emesis.  If this is not covered by her insurance would recommend B6 and Unisom.  All questions have been answered.  Risks, benefits, and alternatives of the medications and treatment plan prescribed today were discussed, and patient expressed understanding. Plan follow-up as discussed or as needed if any worsening symptoms or change in condition.        Documentation for time-based billing:  Total time spent of date of service was 30 minutes.  Patient care activities included preparing to see the patient such as reviewing the patient record, obtaining and/or reviewing separately obtained history, documenting clinical information in the electronic or other health record, and communicating results to the patient/family/caregiver.

## 2023-05-18 NOTE — Progress Notes (Signed)
 ASSESSMENT   1. Positive pregnancy test  Ambulatory Referral to OP Consult to Maternal Fetal Medicine      PLAN  Early Pregnancy -reviewed u/s and EDD determined to be 12/31/2023 -encouraged PNV daily  -NOB packet provided -bleeding precautions discussed  -reviewed findings of ultrasound with patient: including hyperechoic area with posterior shadowing. Recommend evaluation with MFM. Referral placed. -reviewed restricting caffeine intake to <200 mg per day given possible correlation to SGA infants - reports n/v of pregnancy: given patient's ongoing episodes of emesis, IVF hydration offered but patient declined due to travel distance (she lives 40 minutes away). Will send rx for Zofran . Advised patient that if she is unable to keep any fluids down for over 24 hours, she should seek ED care for IV hydration. Patient v/u.  Nicotine  Dependence: requests nicotine  patches. Will start with 21mg  daily for 6 weeks, then plan to taper to 14mg  for 2 weeks then 7mg  daily for 2 weeks. Rx sent to pharmacy.  GERD: Protonix 20mg  bid -High risk medication use: MFM referral placed  Dispo - RTC in 3 weeks for RN IPV, 5 weeks for provider IPV  Future Appointments  Date Time Provider Department Center  05/24/2023  1:00 PM Bernardino JINNY Ina, PA-C [facility] FAM None    --------------------------------------------------------------------------------------- Chief Complaint  Patient presents with  . Follow-up    Sono, no VB, LOF. Needs Rx for nicotine  patches      Continued Medications:  Current Outpatient Medications on File Prior to Visit  Medication Sig Dispense Refill  . aripiprazole  (ABILIFY ) 30 MG tablet Take one tablet (30 mg dose) by mouth at bedtime.    . busPIRone (BUSPAR) 30 MG tablet Take one tablet (30 mg dose) by mouth 2 (two) times daily.    . lithium  carbonate (LITHOBID ) 300 mg ER tablet Take two tablets (600 mg dose) by mouth at bedtime.    . Prenatal Vit-Fe Fumarate-FA (TRINATE) TABS Take 1  tablet by mouth daily. 90 tablet 2  . traZODone  (DESYREL ) 50 mg tablet Take one tablet (50 mg dose) by mouth at bedtime.    . vitamin B-6 (PYRIDOXINE) 25 mg tablet Take one tablet (25 mg dose) by mouth daily. 30 tablet 1   No current facility-administered medications on file prior to visit.    SUBJECTIVE  Deborah Mckee is a 23 y.o. G1P0 who presents with her partner for missed period. States Patient's last menstrual period was 03/26/2023 (exact date).. Periods are regular and monthly. This is a welcome pregnancy and the couple are excited. Patient notes she has experienced a lot of vomiting over the past few weeks. She reports she vomited 5 times this morning. Patient does say that she is usually able to get some food and liquid down during the afternoon. She was prescribed Diclegis but insurance would not cover the medication and has been trying Vitamin B6 and Unisom but notes this has not helped. Patient also notes that she vapes constantly and is trying to quit. She requests nicotine  patches. She also inquires about MJ use. Discussed with patient that MJ use in pregnancy is not recommended due to affects on fetal brain development.    Past Medical History, Past Surgery History, Allergies, Social History, and Family History were reviewed and updated.     Review of Systems is complete and negative except as noted.  OBJECTIVE   BP 100/60   Wt 137 lb (62.1 kg)   LMP 03/26/2023 (Exact Date)   BMI 21.14 kg/m  (Vitals reviewed)  TVUS  Gestational sac: round, high in uterus Yolk sac: present Fetal pole: present CRL: 1.29cm c/w [redacted]w[redacted]d FHR: 161 bpm Subchorionic hemorrhage: absent EDC by LMP: 12/31/2023 c/w 7 w4d EDC by US  today: 01/01/2024 c/w 7 w3d Final EDC: 12/31/2023  Adnexa: normal Cervix: long, closed A hyperechoic area with posterior shadowing at posterior bladder wall noted, measuring 1.94 x 0.67 x 1.97cm No free fluid

## 2023-06-08 DIAGNOSIS — Z79899 Other long term (current) drug therapy: Secondary | ICD-10-CM | POA: Insufficient documentation

## 2023-06-08 DIAGNOSIS — F17200 Nicotine dependence, unspecified, uncomplicated: Secondary | ICD-10-CM | POA: Insufficient documentation

## 2023-06-23 DIAGNOSIS — F1911 Other psychoactive substance abuse, in remission: Secondary | ICD-10-CM | POA: Insufficient documentation

## 2023-06-23 LAB — OB RESULTS CONSOLE RUBELLA ANTIBODY, IGM: Rubella: IMMUNE

## 2023-06-23 LAB — OB RESULTS CONSOLE HEPATITIS B SURFACE ANTIGEN: Hepatitis B Surface Ag: NEGATIVE

## 2023-06-23 LAB — OB RESULTS CONSOLE VARICELLA ZOSTER ANTIBODY, IGG: Varicella: IMMUNE

## 2023-06-23 NOTE — Progress Notes (Signed)
 Subjective   Deborah Mckee is a 23 y.o. G1P0 being seen today for her NOB exam visit.  She is at [redacted] weeks gestation by LMP/Sono. EDD: 12/31/2023. Pregnancy history fully reviewed and NOB intake info reviewed. I reviewed  optional genetic testing and she understands these are time sensitive, and she declines genetic testing at this time.  Labs ordered. Pap, cultures per protocol. Zika transmission and exposure discussed.   Prenatal History -Nullip -Nicotine  dependence: cigarette/vaping, currently using nicotine  patch, trying to quit -Borderline Personality Disorder/Bipolar: Lithium  600mg  daily, Buspar 30mg , Abilify  15mg , followed by Mliss Parsley, PA with [facility] in [place], lithium  levels to be managed by PCP, MFM appt was scheduled for 2/19 but patient no showed, patient notes she cannot afford the $75 it would cost to be seen at their office. She is in the process of applying for maternity medicaid. We discussed that often times, these policies will retroactively cover maternity care visits but recommend confirming with insurance first. Reviewed that due to patient's complex medications, it is highly recommended that she be seen by MFM. Patient voices she will reach out to both the MFM office and her medicaid office to see what can ben done.  -Insomnia: Trazodone  50mg  -h/o cocaine abuse -No h/o chicken pox: varicella titer today  Last Pap: none, due today     No past medical history on file.  Past Surgical History:  Procedure Laterality Date  . Inguinal hernia repair  2007   2007, 2017   Current Outpatient Medications  Medication Sig Dispense Refill  . aripiprazole  (ABILIFY ) 30 MG tablet Take one half tablet (15 mg dose) by mouth at bedtime.    . busPIRone (BUSPAR) 30 MG tablet Take one tablet (30 mg dose) by mouth 2 (two) times daily.    . lithium  carbonate (LITHOBID ) 300 mg ER tablet Take two tablets (600 mg dose) by mouth at bedtime.    . nicotine  (HABITROL ,NICODERM  CQ) 21 mg/24 hours Place one patch onto the skin daily. 30 patch 1  . ondansetron  (ZOFRAN ) 4 mg tablet Take one tablet (4 mg dose) by mouth every 8 (eight) hours as needed for Nausea.    . Prenatal MV & Min w/FA-DHA (PRENATAL ADULT GUMMY/DHA/FA PO) Take by mouth.    . traZODone  (DESYREL ) 50 mg tablet Take one tablet (50 mg dose) by mouth at bedtime.     No current facility-administered medications for this visit.   Penicillins   Objective:   Vitals:   06/23/23 1042  BP: 110/60    Chaperone: Olivette H., CMA  General: Alert, cooperative, no distress, appears stated age.  Head: Normocephalic, without obvious abnormality, atraumatic.  Neck: Supple, symmetrical, no adenopathy, thyroid: no enlargement/tenderness/nodules,   Lungs: Clear to auscultation bilaterally.  Chest wall: No tenderness or deformity.  Heart: Regular rate and rhythm,  no murmur, click, rub or gallop.  Breast Exam: no masses, skin changes or nipple abnormality.  Abdomen: Soft, non-tender. No masses, No organomegaly.  Genitalia: Normal female without lesion or tenderness, copious thick, white, curd-like discharge noted along vaginal canal and around cervix.  Uterus: 12 week size, retroverted, non tender Adnexa: nl ovaries, non tender, no masses  Extremities: Extremities normal, atraumatic, no cyanosis or edema.  Psych: Does not appear anxious, depressed, irritable.  Oriented to person, place and time. Skin: Skin color, texture, turgor normal. No rashes or lesions.    Assessment:   Pregnancy at 12 and 5 weeks   Plan:   Initial labs drawn, cultures and  PAP obtained  Prenatal vitamins and folic acid Curd-like Discharge: patient endorses some vaginal irritation, NuSwab obtained but will empirically treat for yeast based on physical exam Calcium  1500 mg daily Problem list reviewed and updated  Optional genetic testing reviewed; declined at this time Role of ultrasound in pregnancy discussed Follow-up in 4  weeks

## 2023-06-25 DIAGNOSIS — O26899 Other specified pregnancy related conditions, unspecified trimester: Secondary | ICD-10-CM | POA: Insufficient documentation

## 2023-06-25 DIAGNOSIS — Z6791 Unspecified blood type, Rh negative: Secondary | ICD-10-CM

## 2023-08-19 DIAGNOSIS — O99332 Smoking (tobacco) complicating pregnancy, second trimester: Secondary | ICD-10-CM | POA: Diagnosis not present

## 2023-08-19 DIAGNOSIS — O99322 Drug use complicating pregnancy, second trimester: Secondary | ICD-10-CM | POA: Diagnosis not present

## 2023-08-19 DIAGNOSIS — O99342 Other mental disorders complicating pregnancy, second trimester: Secondary | ICD-10-CM | POA: Diagnosis not present

## 2023-08-19 DIAGNOSIS — Z79899 Other long term (current) drug therapy: Secondary | ICD-10-CM | POA: Diagnosis not present

## 2023-08-19 DIAGNOSIS — Z3A20 20 weeks gestation of pregnancy: Secondary | ICD-10-CM | POA: Diagnosis not present

## 2023-08-30 DIAGNOSIS — Z419 Encounter for procedure for purposes other than remedying health state, unspecified: Secondary | ICD-10-CM | POA: Diagnosis not present

## 2023-09-09 DIAGNOSIS — O0990 Supervision of high risk pregnancy, unspecified, unspecified trimester: Secondary | ICD-10-CM | POA: Insufficient documentation

## 2023-09-30 DIAGNOSIS — Z419 Encounter for procedure for purposes other than remedying health state, unspecified: Secondary | ICD-10-CM | POA: Diagnosis not present

## 2023-10-19 NOTE — Progress Notes (Signed)
 23 y.o. G1P0 @ [redacted]w[redacted]d for ROB    Good FM, no ctxns, no lof, no vb.  She did not complete GTT and labs as instructed last visit. She has not had antibody screen and is due for Rhogam  She is very tearful today and is having difficulty functioning due to her bipolar disorder. She does not feel adequately managed by her current behavioral health team. She has no SI or HI but does not feel comfortable in her own skin and is exhausted   Prenatal History:  -Nullip -Nicotine  dependence: cigarette/vaping, currently using nicotine  patch, trying to quit -Borderline Personality Disorder/Bipolar: Lithium  300mg  daily (she had tried to d/c lithium  but had to restart), Buspar 30mg  bid, Abilify  15mg , followed by Mliss Parsley, PA with [facility] in [place], lithium  levels to be managed by PCP,  -Insomnia: Trazodone  50mg  -h/o cocaine abuse -Rh Neg: Rhogam due   Saw MFM for ultrasound on 6/30: EFW 37%   TODAY GTT and labs now TDAP now She will return after labs drawn for Rhogam injection today PTL and general precautions reviewed Urgent referral placed to establish care with new psychiatrist. She declines crisis intervention today Work note given for today. Support provided MFM 7/29 ROB in 2 weeks

## 2023-10-20 LAB — OB RESULTS CONSOLE HIV ANTIBODY (ROUTINE TESTING): HIV: NONREACTIVE

## 2023-10-30 DIAGNOSIS — Z419 Encounter for procedure for purposes other than remedying health state, unspecified: Secondary | ICD-10-CM | POA: Diagnosis not present

## 2023-11-04 NOTE — Progress Notes (Signed)
 22 y.o. G1P0 @ [redacted]w[redacted]d for ROB    Good FM, no ctxns, no lof, no vb.     Prenatal History:  -Nullip -Nicotine  dependence: cigarette/vaping, currently using nicotine  patch, trying to quit -Borderline Personality Disorder/Bipolar: Lithium  300mg  daily (she had tried to d/c lithium  but had to restart), Buspar 30mg  bid, Abilify  15mg , followed by Mliss Parsley, PA with [facility] in [place], lithium  levels to be managed by PCP,  -Insomnia: Trazodone  50mg  -h/o cocaine abuse -Rh Neg: Rhogam completed 7/2 -Size < dates: MFM following   Saw MFM for ultrasound on 6/30: EFW 37% Referral was placed for new psychiatric provider at her last visit. They have reached out and she is working on Training and development officer. She is feeling much better today.  Complains of a vaginal discharge and odor today. Yellow, frothy discharge noted. NuSwab collected. Rx sent for Flagyl .    TODAY MFM 7/29 ROB in 2 weeks

## 2023-11-05 LAB — OB RESULTS CONSOLE GC/CHLAMYDIA
Chlamydia: POSITIVE
Neisseria Gonorrhea: NEGATIVE

## 2023-11-09 DIAGNOSIS — Z3482 Encounter for supervision of other normal pregnancy, second trimester: Secondary | ICD-10-CM | POA: Diagnosis not present

## 2023-11-09 DIAGNOSIS — Z3483 Encounter for supervision of other normal pregnancy, third trimester: Secondary | ICD-10-CM | POA: Diagnosis not present

## 2023-11-10 DIAGNOSIS — O98313 Other infections with a predominantly sexual mode of transmission complicating pregnancy, third trimester: Secondary | ICD-10-CM | POA: Diagnosis present

## 2023-11-10 DIAGNOSIS — A568 Sexually transmitted chlamydial infection of other sites: Secondary | ICD-10-CM | POA: Insufficient documentation

## 2023-11-14 ENCOUNTER — Encounter: Payer: Self-pay | Admitting: Obstetrics and Gynecology

## 2023-11-14 ENCOUNTER — Inpatient Hospital Stay
Admission: EM | Admit: 2023-11-14 | Discharge: 2023-11-23 | DRG: 787 | Disposition: A | Discharge status: Other Acute Inpatient Hospital | Attending: Obstetrics | Admitting: Obstetrics

## 2023-11-14 DIAGNOSIS — Z3A33 33 weeks gestation of pregnancy: Secondary | ICD-10-CM

## 2023-11-14 DIAGNOSIS — F3130 Bipolar disorder, current episode depressed, mild or moderate severity, unspecified: Secondary | ICD-10-CM | POA: Diagnosis not present

## 2023-11-14 DIAGNOSIS — O98313 Other infections with a predominantly sexual mode of transmission complicating pregnancy, third trimester: Secondary | ICD-10-CM | POA: Diagnosis present

## 2023-11-14 DIAGNOSIS — O99323 Drug use complicating pregnancy, third trimester: Secondary | ICD-10-CM | POA: Diagnosis not present

## 2023-11-14 DIAGNOSIS — F17290 Nicotine dependence, other tobacco product, uncomplicated: Secondary | ICD-10-CM | POA: Diagnosis present

## 2023-11-14 DIAGNOSIS — F4312 Post-traumatic stress disorder, chronic: Secondary | ICD-10-CM | POA: Diagnosis present

## 2023-11-14 DIAGNOSIS — F319 Bipolar disorder, unspecified: Secondary | ICD-10-CM | POA: Diagnosis not present

## 2023-11-14 DIAGNOSIS — O99344 Other mental disorders complicating childbirth: Secondary | ICD-10-CM | POA: Diagnosis present

## 2023-11-14 DIAGNOSIS — O42913 Preterm premature rupture of membranes, unspecified as to length of time between rupture and onset of labor, third trimester: Principal | ICD-10-CM | POA: Diagnosis present

## 2023-11-14 DIAGNOSIS — F332 Major depressive disorder, recurrent severe without psychotic features: Secondary | ICD-10-CM | POA: Diagnosis not present

## 2023-11-14 DIAGNOSIS — F1410 Cocaine abuse, uncomplicated: Secondary | ICD-10-CM | POA: Diagnosis present

## 2023-11-14 DIAGNOSIS — F1910 Other psychoactive substance abuse, uncomplicated: Secondary | ICD-10-CM | POA: Diagnosis present

## 2023-11-14 DIAGNOSIS — F4310 Post-traumatic stress disorder, unspecified: Secondary | ICD-10-CM | POA: Diagnosis not present

## 2023-11-14 DIAGNOSIS — L905 Scar conditions and fibrosis of skin: Secondary | ICD-10-CM | POA: Diagnosis not present

## 2023-11-14 DIAGNOSIS — A568 Sexually transmitted chlamydial infection of other sites: Secondary | ICD-10-CM | POA: Diagnosis present

## 2023-11-14 DIAGNOSIS — O9832 Other infections with a predominantly sexual mode of transmission complicating childbirth: Secondary | ICD-10-CM | POA: Diagnosis present

## 2023-11-14 DIAGNOSIS — F3160 Bipolar disorder, current episode mixed, unspecified: Secondary | ICD-10-CM | POA: Diagnosis present

## 2023-11-14 DIAGNOSIS — F1210 Cannabis abuse, uncomplicated: Secondary | ICD-10-CM | POA: Diagnosis present

## 2023-11-14 DIAGNOSIS — O99343 Other mental disorders complicating pregnancy, third trimester: Secondary | ICD-10-CM | POA: Diagnosis not present

## 2023-11-14 DIAGNOSIS — F1490 Cocaine use, unspecified, uncomplicated: Secondary | ICD-10-CM | POA: Diagnosis present

## 2023-11-14 DIAGNOSIS — O902 Hematoma of obstetric wound: Secondary | ICD-10-CM | POA: Diagnosis not present

## 2023-11-14 DIAGNOSIS — O368330 Maternal care for abnormalities of the fetal heart rate or rhythm, third trimester, not applicable or unspecified: Secondary | ICD-10-CM | POA: Diagnosis not present

## 2023-11-14 DIAGNOSIS — O99320 Drug use complicating pregnancy, unspecified trimester: Secondary | ICD-10-CM | POA: Diagnosis present

## 2023-11-14 DIAGNOSIS — R45851 Suicidal ideations: Secondary | ICD-10-CM | POA: Diagnosis present

## 2023-11-14 DIAGNOSIS — Z98891 History of uterine scar from previous surgery: Principal | ICD-10-CM

## 2023-11-14 DIAGNOSIS — O34218 Maternal care for other type scar from previous cesarean delivery: Secondary | ICD-10-CM | POA: Diagnosis not present

## 2023-11-14 DIAGNOSIS — Z9151 Personal history of suicidal behavior: Secondary | ICD-10-CM

## 2023-11-14 DIAGNOSIS — Z79899 Other long term (current) drug therapy: Secondary | ICD-10-CM | POA: Diagnosis not present

## 2023-11-14 DIAGNOSIS — O26893 Other specified pregnancy related conditions, third trimester: Secondary | ICD-10-CM | POA: Diagnosis present

## 2023-11-14 DIAGNOSIS — O26899 Other specified pregnancy related conditions, unspecified trimester: Secondary | ICD-10-CM

## 2023-11-14 DIAGNOSIS — F1290 Cannabis use, unspecified, uncomplicated: Secondary | ICD-10-CM | POA: Diagnosis present

## 2023-11-14 DIAGNOSIS — O99324 Drug use complicating childbirth: Secondary | ICD-10-CM | POA: Diagnosis present

## 2023-11-14 DIAGNOSIS — O900 Disruption of cesarean delivery wound: Secondary | ICD-10-CM | POA: Diagnosis not present

## 2023-11-14 DIAGNOSIS — O99334 Smoking (tobacco) complicating childbirth: Secondary | ICD-10-CM | POA: Diagnosis present

## 2023-11-14 DIAGNOSIS — O42919 Preterm premature rupture of membranes, unspecified as to length of time between rupture and onset of labor, unspecified trimester: Principal | ICD-10-CM | POA: Diagnosis present

## 2023-11-14 DIAGNOSIS — Z880 Allergy status to penicillin: Secondary | ICD-10-CM

## 2023-11-14 DIAGNOSIS — O0933 Supervision of pregnancy with insufficient antenatal care, third trimester: Secondary | ICD-10-CM | POA: Diagnosis not present

## 2023-11-14 DIAGNOSIS — G8929 Other chronic pain: Secondary | ICD-10-CM | POA: Diagnosis present

## 2023-11-14 DIAGNOSIS — Z6791 Unspecified blood type, Rh negative: Secondary | ICD-10-CM | POA: Diagnosis not present

## 2023-11-14 DIAGNOSIS — F419 Anxiety disorder, unspecified: Secondary | ICD-10-CM | POA: Diagnosis present

## 2023-11-14 DIAGNOSIS — O42113 Preterm premature rupture of membranes, onset of labor more than 24 hours following rupture, third trimester: Secondary | ICD-10-CM | POA: Diagnosis not present

## 2023-11-14 DIAGNOSIS — Z3A34 34 weeks gestation of pregnancy: Secondary | ICD-10-CM | POA: Diagnosis not present

## 2023-11-14 DIAGNOSIS — F603 Borderline personality disorder: Secondary | ICD-10-CM | POA: Diagnosis present

## 2023-11-14 LAB — URINALYSIS, ROUTINE W REFLEX MICROSCOPIC
Bacteria, UA: NONE SEEN
Bilirubin Urine: NEGATIVE
Glucose, UA: NEGATIVE mg/dL
Hgb urine dipstick: NEGATIVE
Ketones, ur: 80 mg/dL — AB
Nitrite: NEGATIVE
Protein, ur: 30 mg/dL — AB
Specific Gravity, Urine: 1.029 (ref 1.005–1.030)
pH: 6 (ref 5.0–8.0)

## 2023-11-14 LAB — RUPTURE OF MEMBRANE (ROM)PLUS: Rom Plus: POSITIVE

## 2023-11-14 LAB — WET PREP, GENITAL
Clue Cells Wet Prep HPF POC: NONE SEEN
Sperm: NONE SEEN
Trich, Wet Prep: NONE SEEN
WBC, Wet Prep HPF POC: <10 (ref <10)
Yeast Wet Prep HPF POC: NONE SEEN

## 2023-11-14 MED ORDER — BETAMETHASONE SOD PHOS & ACET 6 (3-3) MG/ML IJ SUSP
12.0000 mg | INTRAMUSCULAR | Status: AC
  Administered 2023-11-15 – 2023-11-16 (×2): 12 mg via INTRAMUSCULAR
  Filled 2023-11-14 (×2): qty 5
  Filled 2023-11-14: qty 2

## 2023-11-14 MED ORDER — TERBUTALINE SULFATE 1 MG/ML IJ SOLN
INTRAMUSCULAR | Status: AC
  Filled 2023-11-14: qty 1

## 2023-11-14 MED ORDER — CEFAZOLIN SODIUM-DEXTROSE 1-4 GM/50ML-% IV SOLN
1.0000 g | Freq: Three times a day (TID) | INTRAVENOUS | Status: DC
  Administered 2023-11-15 (×2): 1 g via INTRAVENOUS
  Filled 2023-11-14 (×3): qty 50

## 2023-11-14 MED ORDER — ACETAMINOPHEN 325 MG PO TABS: 650.0000 mg | ORAL_TABLET | ORAL | Status: DC | PRN

## 2023-11-14 MED ORDER — CEPHALEXIN 500 MG PO CAPS
500.0000 mg | ORAL_CAPSULE | Freq: Four times a day (QID) | ORAL | Status: DC
  Filled 2023-11-14: qty 1

## 2023-11-14 MED ORDER — AZITHROMYCIN 500 MG PO TABS
1000.0000 mg | ORAL_TABLET | Freq: Once | ORAL | Status: AC
  Administered 2023-11-15: 1000 mg via ORAL
  Filled 2023-11-14: qty 2

## 2023-11-14 MED ORDER — ONDANSETRON HCL 4 MG/2ML IJ SOLN: 4.0000 mg | Freq: Four times a day (QID) | INTRAMUSCULAR | Status: DC | PRN

## 2023-11-14 NOTE — H&P (Incomplete)
 HISTORY AND PHYSICAL NOTE  History of Present Illness: Deborah Mckee is a 23 y.o. G1P0000 at [redacted]w[redacted]d admitted for rupture of membranes.      Reports active fetal movement  Contractions: denies  LOF/SROM: yes, this morning around 1030 Vaginal bleeding: denies Fetal presentation is unsure.  Factors complicating pregnancy:  Smoker Rh neg Current substance abuse Recent CT infection Bipolar affective disorder Chronic post traumatic stress disorder  Prenatal care site:  {Prenatal care sites:24209}  Patient Active Problem List   Diagnosis Date Noted  . Preterm premature rupture of membranes 11/14/2023  . Bipolar affective disorder, current episode mixed (HCC) 02/11/2021  . Cocaine abuse (HCC) 02/11/2021  . Chronic post-traumatic stress disorder 04/18/2018  . Cannabis abuse 04/18/2018  . Disruptive mood dysregulation disorder (HCC) 03/30/2018    Past Medical History:  Diagnosis Date  . Anxiety   . Chlamydia   . Depression   . History of cocaine abuse (HCC)   . Vaccine for human papilloma virus (HPV) types 6, 11, 16, and 18 administered   . Vision abnormalities    wears glasses    Past Surgical History:  Procedure Laterality Date  . INGUINAL HERNIA PEDIATRIC WITH LAPAROSCOPIC EXAM Left 04/22/2017   Procedure: INGUINAL HERNIA PEDIATRIC WITH LAP LOOK OF PELVIC FLOOR;  Surgeon: Claudius Kaplan, MD;  Location: [facility];  Service: Pediatrics;  Laterality: Left;  . INGUINAL HERNIA REPAIR Right 2008    OB History  Gravida Para Term Preterm AB Living  1 0 0 0 0 0  SAB IAB Ectopic Multiple Live Births  0 0 0 0 0    # Outcome Date GA Lbr Len/2nd Weight Sex Type Anes PTL Lv  1 Current             Social History:  reports that she has been smoking e-cigarettes. She has never used smokeless tobacco. She reports current drug use. Drugs: Marijuana and Cocaine. She reports that she does not drink alcohol.  Family History: family history is not on file.  Allergies   Allergen Reactions  . Penicillins Rash and Other (See Comments)    Medications Prior to Admission  Medication Sig Dispense Refill Last Dose/Taking  . ARIPiprazole  (ABILIFY ) 20 MG tablet    Past Week  . lithium  carbonate (ESKALITH ) 450 MG ER tablet Take 450 mg by mouth at bedtime.   11/13/2023  . lithium  carbonate (LITHOBID ) 300 MG CR tablet 300 mg at bedtime. TAKE 1 TABLET BY MOUTH NIGHTLY (ALONG WITH 450 MG   11/13/2023  . metroNIDAZOLE  (FLAGYL ) 500 MG tablet Take 500 mg by mouth 3 (three) times daily.   11/14/2023  . trazodone  (DESYREL ) 300 MG tablet Take 300 mg by mouth at bedtime.   11/13/2023  . diazepam (VALIUM) 5 MG tablet Take 2.5-5 mg by mouth daily as needed. (Patient not taking: Reported on 11/14/2023)   Not Taking  . fluconazole  (DIFLUCAN ) 150 MG tablet 1 PO x 1 dose. May repeat in 3 days if needed (Patient not taking: Reported on 09/12/2022) 2 tablet 0   . lisdexamfetamine (VYVANSE) 40 MG capsule Take 40 mg by mouth every morning. (Patient not taking: Reported on 09/12/2022)     . nicotine  (NICODERM CQ  - DOSED IN MG/24 HOURS) 21 mg/24hr patch Place 1 patch (21 mg total) onto the skin daily. (Patient not taking: Reported on 11/13/2021) 30 patch 0 Not Taking    ROS  Physical Examination: Vitals:  BP 115/63   Pulse 65   LMP 03/26/2023 (Exact  Date)  General: no acute distress.  HEENT: normocephalic, atraumatic Heart: regular rate & rhythm.  No murmurs/rubs/gallops Lungs: clear to auscultation bilaterally, normal respiratory effort Abdomen: soft, gravid, non-tender;  EFW: *** Pelvic:   External: Normal external female genitalia  Cervix: Dilation: Closed /   /      Extremities: non-tender, symmetric, *** edema bilaterally.  DTRs: ***  Neurologic: Alert & oriented x 3.    Membranes: {:314523} FHT:  {findings; monitor fetal heart monitor:21620:::0} Category/reactivity:  {CHL FWB PLAN:21624:::0} UC:   {obgyn contractions reg/irreg:312982:::0}  Labs:  Results for orders placed  or performed during the hospital encounter of 11/14/23 (from the past 24 hours)  Urinalysis, Routine w reflex microscopic -Urine, Clean Catch   Collection Time: 11/14/23 10:21 PM  Result Value Ref Range   Color, Urine YELLOW (A) YELLOW   APPearance CLEAR (A) CLEAR   Specific Gravity, Urine 1.029 1.005 - 1.030   pH 6.0 5.0 - 8.0   Glucose, UA NEGATIVE NEGATIVE mg/dL   Hgb urine dipstick NEGATIVE NEGATIVE   Bilirubin Urine NEGATIVE NEGATIVE   Ketones, ur 80 (A) NEGATIVE mg/dL   Protein, ur 30 (A) NEGATIVE mg/dL   Nitrite NEGATIVE NEGATIVE   Leukocytes,Ua TRACE (A) NEGATIVE   RBC / HPF 0-5 0 - 5 RBC/hpf   WBC, UA 11-20 0 - 5 WBC/hpf   Bacteria, UA NONE SEEN NONE SEEN   Squamous Epithelial / HPF 0-5 0 - 5 /HPF   Mucus PRESENT    Hyaline Casts, UA PRESENT   Rupture of Membrane (ROM) Plus   Collection Time: 11/14/23 10:21 PM  Result Value Ref Range   Rom Plus POSITIVE   Wet prep, genital   Collection Time: 11/14/23 10:22 PM   Specimen: Urine, Clean Catch  Result Value Ref Range   Yeast Wet Prep HPF POC NONE SEEN NONE SEEN   Trich, Wet Prep NONE SEEN NONE SEEN   Clue Cells Wet Prep HPF POC NONE SEEN NONE SEEN   WBC, Wet Prep HPF POC <10 <10   Sperm NONE SEEN     Prenatal Labs: Blood type/Rh ***  Antibody screen neg  Rubella ***Immune  Varicella ***Immune  RPR NR  HBsAg Neg  Hep C NR  HIV NR  GC neg  Chlamydia neg  Genetic screening cfDNA negative ***  1 hour GTT ***  3 hour GTT ***  GBS ***    Imaging Studies: No results found.  Assessment and Plan: Patient Active Problem List   Diagnosis Date Noted  . Preterm premature rupture of membranes 11/14/2023  . Bipolar affective disorder, current episode mixed (HCC) 02/11/2021  . Cocaine abuse (HCC) 02/11/2021  . Chronic post-traumatic stress disorder 04/18/2018  . Cannabis abuse 04/18/2018  . Disruptive mood dysregulation disorder (HCC) 03/30/2018    1. Admit to Antenatal -Betamethasone  x 2 doses -Magnesium   sulfate for CP prophylaxis -Will recheck presentation if she does progress in preterm labor to determine route of delivery -Routine antenatal care -NICU consult ***  [name], CNM  Certified Nurse Midwife [place]  [facility]

## 2023-11-14 NOTE — H&P (Signed)
 HISTORY AND PHYSICAL NOTE  History of Present Illness: Deborah Mckee is a 23 y.o. G1P0000 at [redacted]w[redacted]d admitted for rupture of membranes.      Reports active fetal movement  Contractions: denies  LOF/SROM: yes, this morning around 1030 Vaginal bleeding: denies Fetal presentation is unsure.  Factors complicating pregnancy:  Smoker Rh neg - Rhogam completed 7/2  Current substance abuse Recent CT infection Bipolar affective disorder - Lithium  300mg  daily, Buspar 30mg  bid, Abilify  15mg , followed by Mliss Parsley, PA with [facility] in [place], lithium  levels to be managed by PCP  Chronic post traumatic stress disorder  Prenatal care site:  [facility] [place])      Patient Active Problem List   Diagnosis Date Noted   Preterm premature rupture of membranes 11/14/2023   Chlamydia trachomatis infection in mother during third trimester of pregnancy 11/10/2023   High risk pregnancy, antepartum 09/09/2023   Rh negative state in antepartum period 06/25/2023   History of substance abuse (HCC) 06/23/2023   High risk medication use 06/08/2023   Smoker 06/08/2023   Bipolar affective disorder, current episode mixed (HCC) 02/11/2021   Cocaine abuse (HCC) 02/11/2021   Chronic post-traumatic stress disorder 04/18/2018   Cannabis abuse 04/18/2018   Disruptive mood dysregulation disorder (HCC) 03/30/2018    Past Medical History:  Diagnosis Date   Anxiety    Chlamydia    Depression    History of cocaine abuse (HCC)    Vaccine for human papilloma virus (HPV) types 6, 11, 16, and 18 administered    Vision abnormalities    wears glasses    Past Surgical History:  Procedure Laterality Date   INGUINAL HERNIA PEDIATRIC WITH LAPAROSCOPIC EXAM Left 04/22/2017   Procedure: INGUINAL HERNIA PEDIATRIC WITH LAP LOOK OF PELVIC FLOOR;  Surgeon: Claudius Kaplan, MD;  Location: [facility];  Service: Pediatrics;  Laterality: Left;   INGUINAL HERNIA REPAIR Right 2008     OB History  Gravida Para Term Preterm AB Living  1 0 0 0 0 0  SAB IAB Ectopic Multiple Live Births  0 0 0 0 0    # Outcome Date GA Lbr Len/2nd Weight Sex Type Anes PTL Lv  1 Current             Social History:  reports that she has been smoking e-cigarettes. She has never used smokeless tobacco. She reports current drug use. Drugs: Marijuana and Cocaine. She reports that she does not drink alcohol.  Family History: family history is not on file.  Allergies  Allergen Reactions   Penicillins Rash and Other (See Comments)    Medications Prior to Admission  Medication Sig Dispense Refill Last Dose/Taking   ARIPiprazole  (ABILIFY ) 20 MG tablet    Past Week   lithium  carbonate (ESKALITH ) 450 MG ER tablet Take 450 mg by mouth at bedtime.   11/13/2023   lithium  carbonate (LITHOBID ) 300 MG CR tablet 300 mg at bedtime. TAKE 1 TABLET BY MOUTH NIGHTLY (ALONG WITH 450 MG   11/13/2023   metroNIDAZOLE  (FLAGYL ) 500 MG tablet Take 500 mg by mouth 3 (three) times daily.   11/14/2023   trazodone  (DESYREL ) 300 MG tablet Take 300 mg by mouth at bedtime.   11/13/2023   diazepam (VALIUM) 5 MG tablet Take 2.5-5 mg by mouth daily as needed. (Patient not taking: Reported on 11/14/2023)   Not Taking   fluconazole  (DIFLUCAN ) 150 MG tablet 1 PO x 1 dose. May repeat in 3 days if needed (Patient not  taking: Reported on 09/12/2022) 2 tablet 0    lisdexamfetamine (VYVANSE) 40 MG capsule Take 40 mg by mouth every morning. (Patient not taking: Reported on 09/12/2022)      nicotine  (NICODERM CQ  - DOSED IN MG/24 HOURS) 21 mg/24hr patch Place 1 patch (21 mg total) onto the skin daily. (Patient not taking: Reported on 11/13/2021) 30 patch 0 Not Taking     Physical Examination: Vitals:  BP 115/63   Pulse 65   LMP 03/26/2023 (Exact Date)  General: no acute distress.  HEENT: normocephalic, atraumatic Heart: regular rate & rhythm Lungs: normal respiratory effort Abdomen: soft, gravid, non-tender;  EFW: 10/18/23  37%ile Pelvic:   External: Normal external female genitalia  Cervix: Dilation: Closed /   /      Extremities: non-tender, symmetric, no edema bilaterally.   Neurologic: Alert & oriented x 3.    Membranes: ruptured, clear fluid, sterile speculum exam showed pooling FHT:  FHR: 120 bpm, variability: moderate,  accelerations:  Present,  decelerations:  Absent Category/reactivity:  Category I UC:   none  Labs:  Results for orders placed or performed during the hospital encounter of 11/14/23 (from the past 24 hours)  Urinalysis, Routine w reflex microscopic -Urine, Clean Catch   Collection Time: 11/14/23 10:21 PM  Result Value Ref Range   Color, Urine YELLOW (A) YELLOW   APPearance CLEAR (A) CLEAR   Specific Gravity, Urine 1.029 1.005 - 1.030   pH 6.0 5.0 - 8.0   Glucose, UA NEGATIVE NEGATIVE mg/dL   Hgb urine dipstick NEGATIVE NEGATIVE   Bilirubin Urine NEGATIVE NEGATIVE   Ketones, ur 80 (A) NEGATIVE mg/dL   Protein, ur 30 (A) NEGATIVE mg/dL   Nitrite NEGATIVE NEGATIVE   Leukocytes,Ua TRACE (A) NEGATIVE   RBC / HPF 0-5 0 - 5 RBC/hpf   WBC, UA 11-20 0 - 5 WBC/hpf   Bacteria, UA NONE SEEN NONE SEEN   Squamous Epithelial / HPF 0-5 0 - 5 /HPF   Mucus PRESENT    Hyaline Casts, UA PRESENT   Rupture of Membrane (ROM) Plus   Collection Time: 11/14/23 10:21 PM  Result Value Ref Range   Rom Plus POSITIVE   Wet prep, genital   Collection Time: 11/14/23 10:22 PM   Specimen: Urine, Clean Catch  Result Value Ref Range   Yeast Wet Prep HPF POC NONE SEEN NONE SEEN   Trich, Wet Prep NONE SEEN NONE SEEN   Clue Cells Wet Prep HPF POC NONE SEEN NONE SEEN   WBC, Wet Prep HPF POC <10 <10   Sperm NONE SEEN   Chlamydia/NGC rt PCR ([facility] only)   Collection Time: 11/14/23 10:22 PM   Specimen: Urine, Clean Catch  Result Value Ref Range   Specimen source GC/Chlam ENDOCERVICAL    Chlamydia Tr DETECTED (A) NOT DETECTED   N gonorrhoeae NOT DETECTED NOT DETECTED  CBC   Collection Time: 11/14/23  11:51 PM  Result Value Ref Range   WBC 10.4 4.0 - 10.5 K/uL   RBC 4.32 3.87 - 5.11 MIL/uL   Hemoglobin 12.8 12.0 - 15.0 g/dL   HCT 61.7 63.9 - 53.9 %   MCV 88.4 80.0 - 100.0 fL   MCH 29.6 26.0 - 34.0 pg   MCHC 33.5 30.0 - 36.0 g/dL   RDW 87.3 88.4 - 84.4 %   Platelets 173 150 - 400 K/uL   nRBC 0.0 0.0 - 0.2 %  Type and screen [facility]   Collection Time: 11/14/23 11:51 PM  Result Value Ref Range   ABO/RH(D) PENDING    Antibody Screen PENDING    Sample Expiration      11/17/2023,2359 Performed at [facility], [address] [place], [place] [address]     Prenatal Labs: Blood type/Rh O neg  Antibody screen neg  Rubella Immune  Varicella Immune  RPR NR  HBsAg Neg  Hep C NR  HIV NR  GC neg  Chlamydia Pos  11/05/23  Genetic screening   1 hour GTT 58  3 hour GTT N/a  GBS pending    Imaging Studies: No results found.  Assessment and Plan: Patient Active Problem List   Diagnosis Date Noted   Preterm premature rupture of membranes 11/14/2023   Chlamydia trachomatis infection in mother during third trimester of pregnancy 11/10/2023   High risk pregnancy, antepartum 09/09/2023   Rh negative state in antepartum period 06/25/2023   History of substance abuse (HCC) 06/23/2023   High risk medication use 06/08/2023   Smoker 06/08/2023   Bipolar affective disorder, current episode mixed (HCC) 02/11/2021   Cocaine abuse (HCC) 02/11/2021   Chronic post-traumatic stress disorder 04/18/2018   Cannabis abuse 04/18/2018   Disruptive mood dysregulation disorder (HCC) 03/30/2018    1. Admit to Antenatal - Dr. Verdon notified of patient and POC was discussed  pPROM -Betamethasone  x 2 doses -Latency abx ordered -Limited OB u/s for presentation   Recent Cocaine Use -TOC order placed  -Home medications ordered -Routine antenatal care -NICU consult   [name], CNM  Certified Nurse Midwife [facility]

## 2023-11-14 NOTE — OB Triage Note (Signed)
 Pt Deborah Mckee 22 y.o. presents to labor and delivery triage reporting cocaine use two days ago followed by a gush of fluid this morning, and continuing to trickle all day.  Pt is a G1P0 at 33 weeks . Pt denies active vaginal bleeding. Pt denies contractions and states positive fetal movement. External FM and TOCO applied to non-tender abdomen and assessing. Initial FHR 130. Vital signs obtained and within normal limits. Provider notified of pt.

## 2023-11-15 ENCOUNTER — Other Ambulatory Visit: Payer: Self-pay

## 2023-11-15 ENCOUNTER — Encounter: Payer: Self-pay | Admitting: Obstetrics and Gynecology

## 2023-11-15 ENCOUNTER — Inpatient Hospital Stay

## 2023-11-15 LAB — CBC
HCT: 38.2 % (ref 36.0–46.0)
Hemoglobin: 12.8 g/dL (ref 12.0–15.0)
MCH: 29.6 pg (ref 26.0–34.0)
MCHC: 33.5 g/dL (ref 30.0–36.0)
MCV: 88.4 fL (ref 80.0–100.0)
Platelets: 173 K/uL (ref 150–400)
RBC: 4.32 MIL/uL (ref 3.87–5.11)
RDW: 12.6 % (ref 11.5–15.5)
WBC: 10.4 K/uL (ref 4.0–10.5)
nRBC: 0 % (ref 0.0–0.2)

## 2023-11-15 LAB — URINE DRUG SCREEN, QUALITATIVE (ARMC ONLY)
Amphetamines, Ur Screen: NOT DETECTED
Barbiturates, Ur Screen: NOT DETECTED
Benzodiazepine, Ur Scrn: POSITIVE — AB
Cannabinoid 50 Ng, Ur ~~LOC~~: POSITIVE — AB
Cocaine Metabolite,Ur ~~LOC~~: POSITIVE — AB
MDMA (Ecstasy)Ur Screen: NOT DETECTED
Methadone Scn, Ur: NOT DETECTED
Opiate, Ur Screen: NOT DETECTED
Phencyclidine (PCP) Ur S: NOT DETECTED
Tricyclic, Ur Screen: NOT DETECTED

## 2023-11-15 LAB — CHLAMYDIA/NGC RT PCR (ARMC ONLY)
Chlamydia Tr: DETECTED — AB
N gonorrhoeae: NOT DETECTED

## 2023-11-15 LAB — TYPE AND SCREEN
ABO/RH(D): O NEG
Antibody Screen: POSITIVE

## 2023-11-15 LAB — RPR: RPR Ser Ql: NONREACTIVE

## 2023-11-15 LAB — ABO/RH: ABO/RH(D): O NEG

## 2023-11-15 MED ORDER — LITHIUM CARBONATE ER 300 MG PO TBCR
600.0000 mg | EXTENDED_RELEASE_TABLET | Freq: Every day | ORAL | Status: DC
  Administered 2023-11-15: 600 mg via ORAL
  Filled 2023-11-15 (×2): qty 2

## 2023-11-15 MED ORDER — AZITHROMYCIN 500 MG PO TABS
1000.0000 mg | ORAL_TABLET | Freq: Once | ORAL | Status: AC
  Administered 2023-11-15: 1000 mg via ORAL
  Filled 2023-11-15: qty 2

## 2023-11-15 MED ORDER — LITHIUM CARBONATE ER 300 MG PO TBCR
600.0000 mg | EXTENDED_RELEASE_TABLET | Freq: Every day | ORAL | Status: DC
  Administered 2023-11-16 – 2023-11-20 (×5): 600 mg via ORAL
  Filled 2023-11-15 (×6): qty 2

## 2023-11-15 MED ORDER — SODIUM CHLORIDE 0.9 % IV SOLN: INTRAVENOUS | Status: AC | PRN

## 2023-11-15 MED ORDER — TRAZODONE HCL 50 MG PO TABS
50.0000 mg | ORAL_TABLET | Freq: Every day | ORAL | Status: DC
  Administered 2023-11-15: 50 mg via ORAL
  Filled 2023-11-15 (×2): qty 1

## 2023-11-15 MED ORDER — ARIPIPRAZOLE 10 MG PO TABS
20.0000 mg | ORAL_TABLET | Freq: Every day | ORAL | Status: DC
  Administered 2023-11-15 – 2023-11-16 (×2): 20 mg via ORAL
  Filled 2023-11-15 (×3): qty 2

## 2023-11-15 MED ORDER — SODIUM CHLORIDE 0.9 % IV SOLN
250.0000 mg | Freq: Four times a day (QID) | INTRAVENOUS | Status: AC
  Administered 2023-11-15 – 2023-11-17 (×7): 250 mg via INTRAVENOUS
  Filled 2023-11-15 (×8): qty 5

## 2023-11-15 MED ORDER — ERYTHROMYCIN ETHYLSUCCINATE 200 MG/5ML PO SUSR
333.0000 mg | Freq: Three times a day (TID) | ORAL | Status: AC
  Administered 2023-11-17 – 2023-11-19 (×5): 333 mg via ORAL
  Filled 2023-11-15: qty 8.3
  Filled 2023-11-15: qty 8.32
  Filled 2023-11-15: qty 8.3
  Filled 2023-11-15 (×2): qty 8.32

## 2023-11-15 MED ORDER — NICOTINE 21 MG/24HR TD PT24
21.0000 mg | MEDICATED_PATCH | Freq: Every day | TRANSDERMAL | Status: DC | PRN
  Administered 2023-11-17 – 2023-11-18 (×2): 21 mg via TRANSDERMAL
  Filled 2023-11-15 (×2): qty 1

## 2023-11-15 NOTE — Progress Notes (Signed)
 Patient ID: Deborah Mckee, female   DOB: [birth year], 23 y.o.   MRN: [id number] chart reviewcompleted.  Pt with PPROM and was started on ancef  for PCN allergies . Erythromycin  250 mg IV q 6 hrs started today . + Chlamydia infection so I added 1000 mg Azithromycin  tonight .  Psych consult placed for possible suicidal ideation - they will see in am  Goal to get pt >= 34 weeks and talk about inducing labor  Continue daily NST and cbc If signs , symptoms of infection proceed with induction

## 2023-11-15 NOTE — Progress Notes (Signed)
 ANTEPARTUM PROGRESS NOTE  Deborah Mckee is a 23 y.o. G1P0000 at [redacted]w[redacted]d who is admitted for PPROM.  Estimated Date of Delivery: 12/31/23  Length of Stay:  1 Days. Admitted 11/14/2023  Subjective:  She reports:  -active fetal movement -no leakage of fluid -no vaginal bleeding -no contractions  Vitals:  BP 113/80 (BP Location: Right Arm)   Pulse 68   Temp 97.6 F (36.4 C) (Axillary)   Resp 18   Ht 5' 6 (1.676 m)   Wt 61.2 kg   LMP 03/26/2023 (Exact Date)   SpO2 97%   BMI 21.79 kg/m  Physical Examination: OBGyn Exam  WDU:[id number]: PPROM Baseline FHR: 125 beats/min Variability: moderate Accelerations: present Decelerations: absent Tocometry: quiet  Interpretation:   RESULTS:  A NST procedure was performed with FHR monitoring and a normal baseline established, appropriate time of 20-40 minutes of evaluation, and accels >2 seen w 15x15 characteristics.  Results show a REACTIVE NST.    Deborah Mckee, CNM   Results for orders placed or performed during the hospital encounter of 11/14/23 (from the past 48 hours)  Urinalysis, Routine w reflex microscopic -Urine, Clean Catch     Status: Abnormal   Collection Time: 11/14/23 10:21 PM  Result Value Ref Range   Color, Urine YELLOW (A) YELLOW   APPearance CLEAR (A) CLEAR   Specific Gravity, Urine 1.029 1.005 - 1.030   pH 6.0 5.0 - 8.0   Glucose, UA NEGATIVE NEGATIVE mg/dL   Hgb urine dipstick NEGATIVE NEGATIVE   Bilirubin Urine NEGATIVE NEGATIVE   Ketones, ur 80 (A) NEGATIVE mg/dL   Protein, ur 30 (A) NEGATIVE mg/dL   Nitrite NEGATIVE NEGATIVE   Leukocytes,Ua TRACE (A) NEGATIVE   RBC / HPF 0-5 0 - 5 RBC/hpf   WBC, UA 11-20 0 - 5 WBC/hpf   Bacteria, UA NONE SEEN NONE SEEN   Squamous Epithelial / HPF 0-5 0 - 5 /HPF   Mucus PRESENT    Hyaline Casts, UA PRESENT     Comment: Performed at [facility], [address] [place], [place] [address]  Rupture of Membrane (ROM) Plus     Status: None   Collection  Time: 11/14/23 10:21 PM  Result Value Ref Range   Rom Plus POSITIVE     Comment: Performed at [facility], [address] [place], [place] [address]  Wet prep, genital     Status: None   Collection Time: 11/14/23 10:22 PM   Specimen: Urine, Clean Catch  Result Value Ref Range   Yeast Wet Prep HPF POC NONE SEEN NONE SEEN   Trich, Wet Prep NONE SEEN NONE SEEN   Clue Cells Wet Prep HPF POC NONE SEEN NONE SEEN   WBC, Wet Prep HPF POC <10 <10   Sperm NONE SEEN     Comment: Performed at [facility], [address] [place], [place] [address]  Chlamydia/NGC rt PCR [facility] only)     Status: Abnormal   Collection Time: 11/14/23 10:22 PM   Specimen: Urine, Clean Catch  Result Value Ref Range   Specimen source GC/Chlam ENDOCERVICAL    Chlamydia Tr DETECTED (A) NOT DETECTED   N gonorrhoeae NOT DETECTED NOT DETECTED    Comment: (NOTE) This CT/NG assay has not been evaluated in patients with a history of  hysterectomy. Performed at [facility], [address], [place], [place] [address]   Urine Drug Screen, Qualitative [facility] only)     Status: Abnormal   Collection Time: 11/14/23 10:22  PM  Result Value Ref Range   Tricyclic, Ur Screen NONE DETECTED NONE DETECTED   Amphetamines, Ur Screen NONE DETECTED NONE DETECTED   MDMA (Ecstasy)Ur Screen NONE DETECTED NONE DETECTED   Cocaine Metabolite,Ur Quinn POSITIVE (A) NONE DETECTED   Opiate, Ur Screen NONE DETECTED NONE DETECTED   Phencyclidine (PCP) Ur S NONE DETECTED NONE DETECTED   Cannabinoid 50 Ng, Ur Western POSITIVE (A) NONE DETECTED   Barbiturates, Ur Screen NONE DETECTED NONE DETECTED   Benzodiazepine, Ur Scrn POSITIVE (A) NONE DETECTED   Methadone Scn, Ur NONE DETECTED NONE DETECTED    Comment: (NOTE) Tricyclics + metabolites, urine    Cutoff 1000 ng/mL Amphetamines + metabolites, urine  Cutoff 1000 ng/mL MDMA (Ecstasy), urine              Cutoff 500 ng/mL Cocaine Metabolite, urine          Cutoff 300  ng/mL Opiate + metabolites, urine        Cutoff 300 ng/mL Phencyclidine (PCP), urine         Cutoff 25 ng/mL Cannabinoid, urine                 Cutoff 50 ng/mL Barbiturates + metabolites, urine  Cutoff 200 ng/mL Benzodiazepine, urine              Cutoff 200 ng/mL Methadone, urine                   Cutoff 300 ng/mL  The urine drug screen provides only a preliminary, unconfirmed analytical test result and should not be used for non-medical purposes. Clinical consideration and professional judgment should be applied to any positive drug screen result due to possible interfering substances. A more specific alternate chemical method must be used in order to obtain a confirmed analytical result. Gas chromatography / mass spectrometry (GC/MS) is the preferred confirm atory method. Performed at [facility], [address] [place], [place] [address]   RPR     Status: None   Collection Time: 11/14/23 11:51 PM  Result Value Ref Range   RPR Ser Ql NON REACTIVE NON REACTIVE    Comment: Performed at [facility], [address] [place], [place] [address]  CBC     Status: None   Collection Time: 11/14/23 11:51 PM  Result Value Ref Range   WBC 10.4 4.0 - 10.5 K/uL   RBC 4.32 3.87 - 5.11 MIL/uL   Hemoglobin 12.8 12.0 - 15.0 g/dL   HCT 61.7 63.9 - 53.9 %   MCV 88.4 80.0 - 100.0 fL   MCH 29.6 26.0 - 34.0 pg   MCHC 33.5 30.0 - 36.0 g/dL   RDW 87.3 88.4 - 84.4 %   Platelets 173 150 - 400 K/uL   nRBC 0.0 0.0 - 0.2 %    Comment: Performed at [facility], [address] [place], [place] [address]  Type and screen [facility]     Status: None   Collection Time: 11/14/23 11:51 PM  Result Value Ref Range   ABO/RH(D) O NEG    Antibody Screen POS    Sample Expiration 11/17/2023,2359    Antibody Identification      PASSIVELY ACQUIRED ANTI-D Performed at [facility], [address], [place], [place] [address]   ABO/Rh     Status: None    Collection Time: 11/15/23  4:30 AM  Result Value Ref Range   ABO/RH(D)      O NEG  Performed at [facility], [address] [place], [place] [address]     US  MAINE Limited Result Date: 11/15/2023 CLINICAL DATA:  Premature rupture of membranes EXAM: LIMITED OBSTETRIC ULTRASOUND COMPARISON:  None Available. FINDINGS: Number of Fetuses: 1 Heart Rate:  127 bpm Movement: Present Presentation: Cephalic Placental Location: Posterior Previa: No Amniotic Fluid (Subjective): Decreased consistent with the given clinical history. AFI: 5.6 cm FL: 6.1 cm 31 w  5 d MATERNAL FINDINGS: Cervix:  Appears closed. Uterus/Adnexae: No abnormality visualized. IMPRESSION: Single live intrauterine gestation at 31 weeks 5 days. Decreased amniotic fluid is noted consistent with the known history of premature rupture of membranes. This exam is performed on an emergent basis and does not comprehensively evaluate fetal size, dating, or anatomy; follow-up complete OB US  should be considered if further fetal assessment is warranted. Electronically Signed   By: Oneil Devonshire M.D.   On: 11/15/2023 00:35    Current scheduled medications  ARIPiprazole   20 mg Oral Daily   betamethasone  acetate-betamethasone  sodium phosphate   12 mg Intramuscular Q24H   [START ON 11/17/2023] erythromycin  ethylsuccinate  333 mg Oral Q8H   [START ON 11/16/2023] lithium  carbonate  600 mg Oral Daily   trazodone   50 mg Oral QHS    I have reviewed the patient's current medications.  ASSESSMENT: Patient Active Problem List   Diagnosis Date Noted   Preterm premature rupture of membranes 11/14/2023   Chlamydia trachomatis infection in mother during third trimester of pregnancy 11/10/2023   High risk pregnancy, antepartum 09/09/2023   Rh negative state in antepartum period 06/25/2023   History of substance abuse (HCC) 06/23/2023   High risk medication use 06/08/2023   Smoker 06/08/2023   Bipolar affective disorder, current episode mixed (HCC)  02/11/2021   Cocaine abuse (HCC) 02/11/2021   Chronic post-traumatic stress disorder 04/18/2018   Cannabis abuse 04/18/2018   Disruptive mood dysregulation disorder (HCC) 03/30/2018    PLAN: Continue routine antenatal care. BMZ x 2 dose, 2nd dose due 7/29@0000  -change IV antibiotics  -Start erythromycin  250 mg Q6 x 48 hours, then EES 333 mg Q 8 hours x 5 days -NST Q shift -NICU consult completed -TOC consult ordered -Psych consult placed, will see her tomorrow -Reg diet Dr Lovetta updated and agrees with plan  Deborah Mckee, CNM 11/15/2023 6:14 PM  Deborah Mckee Certified Nurse Midwife [facility]

## 2023-11-15 NOTE — Consult Note (Signed)
 [facility]  Prenatal Consult      11/15/2023   4:18 PM  I was asked by Dr. Verdon to consult on this patient for possible preterm delivery. I had the pleasure of meeting with Deborah Mckee and her partner today.   She is a 23 yo G1P0 female presenting at [redacted]w[redacted]d IUP with concerns for PPROM yesterday without preterm labor. Pregnancy has also been complicated by extensive mental health history including bipolar affective disorder (treated with lithium  (held during early pregnancy), trazodone , valium, vyvanse, buspar, abilify ), substance abuse (UDS positive for cocaine, THC, benzos), Chlamydia infection (recent treatment, TOC positive on admission). She has received BMZ x1 (last dose 0004 this morning). Serial growth measurements have reportedly been low normal. Plan is currently for monitoring, possible induction on/after 8/1 ([redacted]w[redacted]d). They are having a boy and have chosen to name their baby Elijah.   We discussed the possible needs for an infant born at this gestation. I explained that the neonatal intensive care team would be present for the delivery and outlined the likely delivery room course for this baby including routine resuscitation and NRP-guided approaches to the treatment of respiratory distress. We discussed the potential need for CPAP, with much lower risk of mechanical ventilation and surfactant administration for respiratory distress, IV fluids pending establishment of enteral feeds, antibiotics for possible sepsis, temperature support, and monitoring.   We discussed other common problems associated with prematurity including respiratory distress syndrome/CLD, feeding issues, temperature regulation, and infection risk. I also discussed the potential risk of withdrawal given her medications and substance use, and outlined the various treatments based on his course.   We discussed the importance of good nutrition and various methods of providing nutrition (parenteral  hyperalimentation, gavage feedings and/or oral feeding). We discussed the benefits of human milk, we discussed the possibility of using donor breast milk for the first week after birth to aid his transition, and they verbally agreed. Mother expressed that she had previously desired to breastfeed, however she is less inclined at this point given her recent relapse (had previously been clean for 1 year). I outlined the resources available if she does desire to breastfeed, and that she would need at least negative UDS x2. She stated she would consider this, but will likely use formula.  We discussed the average length of stay but I noted that the actual LOS would depend on the severity of problems encountered and response to treatments. We discussed visitation policies and the resources available while their child is in the hospital.  They expressed understanding and agreement with the plan for resuscitation and intensive care. All questions were answered.  Thank you for involving us  in the care of this patient. A member of our team will be available should the family have additional questions.   Alyce Kirsch, MD Neonatal Medicine  I spent ~40 minutes in consultation time, of which 25 minutes was spent in direct face to face counseling.

## 2023-11-16 DIAGNOSIS — Z3A33 33 weeks gestation of pregnancy: Secondary | ICD-10-CM

## 2023-11-16 DIAGNOSIS — F4310 Post-traumatic stress disorder, unspecified: Secondary | ICD-10-CM

## 2023-11-16 DIAGNOSIS — O99343 Other mental disorders complicating pregnancy, third trimester: Secondary | ICD-10-CM

## 2023-11-16 DIAGNOSIS — F3130 Bipolar disorder, current episode depressed, mild or moderate severity, unspecified: Secondary | ICD-10-CM

## 2023-11-16 LAB — CBC
HCT: 35.4 % — ABNORMAL LOW (ref 36.0–46.0)
Hemoglobin: 12.1 g/dL (ref 12.0–15.0)
MCH: 30.3 pg (ref 26.0–34.0)
MCHC: 34.2 g/dL (ref 30.0–36.0)
MCV: 88.7 fL (ref 80.0–100.0)
Platelets: 142 K/uL — ABNORMAL LOW (ref 150–400)
RBC: 3.99 MIL/uL (ref 3.87–5.11)
RDW: 12.7 % (ref 11.5–15.5)
WBC: 10.4 K/uL (ref 4.0–10.5)
nRBC: 0 % (ref 0.0–0.2)

## 2023-11-16 MED ORDER — FAMOTIDINE 20 MG PO TABS
20.0000 mg | ORAL_TABLET | Freq: Every day | ORAL | Status: DC
  Administered 2023-11-16 – 2023-11-20 (×5): 20 mg via ORAL
  Filled 2023-11-16 (×5): qty 1

## 2023-11-16 MED ORDER — CALCIUM CARBONATE ANTACID 500 MG PO CHEW
1.0000 | CHEWABLE_TABLET | Freq: Two times a day (BID) | ORAL | Status: DC | PRN
  Administered 2023-11-16: 200 mg via ORAL
  Filled 2023-11-16: qty 1

## 2023-11-16 NOTE — Discharge Instructions (Addendum)
 Food Resources  Agency Name: [facility] Address: [address] [place], [place] [address] Phone: [phone] Website: www.[email or web address] Service(s) Offered: Housing services, self-sufficiency, congregate meal program, weatherization program, Event organiser program, emergency food assistance,  housing counseling, home ownership program, wheels - to work program.  [employer] free for 60 and older at various locations from [employer], Monday-Friday:  [employer], [address] [place], [phone] -[facility], [address], [place] [phone]  -[facility], [address] [place] [phone]  -[address], [address], [place], [phone]  Agency Name: [facility] Address: [phone] [address], [place], [place] [address] Phone: [phone] Website: www.[email or web address] Service(s) Offered: Home delivered hot, frozen, and emergency  meals. Grocery assistance program which matches  volunteers one-on-one with seniors unable to grocery shop  for themselves. Must be 60 years and older; less than 20  hours of in-home aide service, limited or no driving ability;  live alone or with someone with a disability; live in  [place].  Agency Name: Ecologist [facility] [employer]) Address: [address] [place], [place] [address] Phone: [phone] Service(s) Offered: Food is served to shut-ins, homeless, elderly, and low income people in the community every Saturday (11:30 am-12:30 pm) and Sunday (12:30 pm-1:30pm). Volunteers also offer help and encouragement in seeking employment,  and spiritual guidance.  Agency Name: [employer] Address: [address] [place], [place] [address] Phone: [phone] Service(s) Offered: Child support services; child welfare services; food stamps; Medicaid; work first family assistance; and aid  with fuel,  rent, food and medicine.  Agency Name: [employer]itian Address: [address], [place], [place] Phone: [phone] Website: [email or web address] Services Offered: Monday 10:00am-12:00, 8:00pm-9:00pm, and Friday 10:00am-12:00.  Agency Name: [facility] [place] Address: [address] [place], [place] [address] Phone: [phone] Website: www.[email or web address] Service(s) Offered: Serves weekday meals, open from 11:30 am- 1:00 pm., and 6:30-7:30pm, Monday-Wednesday-Friday distributes food 3:30-6pm, Monday-Wednesday-Friday.  Agency Name: [facility] Address: [address] [place], [place] Phone: [phone] Website: [email or web address] Services Offered: Distributes food the 4th Saturday of the month, starting at 8:00 am  Agency Name: [facility] Address: [phone] [address] [place], [place] [address] Phone: [phone] Website: [email or web address] Service(s) Offered: Bread of life, weekly food pantry. Open Wednesdays from 10:00am-noon.  Agency Name: [facility] [employer] Address: [address] [place], [place], [place] Phone: [phone] Services Offered: Distributes food 9am-1pm, Monday-Thursday. Call for details.  Agency Name: [facility] [place] [facility] Address: [address], [place], [place] [address] Phone: [phone] Website: [email or web address] Service(s) Offered: Games developer. Call for assistance.  Agency Name: [facility] Address: [address], [place], [place] [address] Phone: [phone] Service Offered: Emergency Food Pantry. Call for appointment.  Agency Name: [facility] Address: [address] [place], [place] [address] Phone: [phone] Website: [email or web address] Services Offered: Games developer. Call for details  Agency Name: [facility] Address: [address] [place], [place] Phone:  [phone] Website: [email or web address] Service(s) Offered: Emergency Food Pantry. Call for details.  Agency Name: Holiday representative Address: [address] [place], [place] [address] Phone: [phone] or [phone] Website: [email or web address] Service(s) Offered: Distribute food 9am-11:30 am, Tuesday-Friday, and 1-3:30pm, Monday-Friday. Food pantry Monday-Friday 1pm-3pm, fresh items, Mon.-Wed.-Fri.  Agency Name: [facility] Address: [address] [place], [place] [address] Phone: [phone] Website: www.[email or web address].org Services Offered: Distribute food Tues and Sats from 9:00am-noon.  Closed 1st Saturday of each month. Call for details  Agency Name: [employer] Address: Fayrene Boatman [facility] [address], [place] [address] Phone: [phone]  Services Offered: Delivers meals every Thursday   Some PCP options in [place] area- not a comprehensive list  [facility]- [phone] [facility]- [phone] [facility]- [phone] [facility]- [phone] [facility]- [phone] Nichole Molly- [phone]  or [facility] Health Physician Referral Line [phone]   Rent/Utility/Housing  Agency Name: [facility] Address: [address] [place], [place] [address] Phone: [phone] Email: [email or web address] Website: [email or web address] Service(s) Offered: Housing services, self-sufficiency, congregate meal program, weatherization program, Field seismologist program, emergency food assistance,  housing counseling, home ownership program, wheels -towork program.  Agency Name: [facility] Address: [address], [place], [place] [address] Phone: [phone] (8a-4p) [phone] (8p- 10p) Email: [email or web address] Website: [email or web address] Service(s) Offered: A program for homeless and/or needy men  that includes one-on-one counseling, life skills training and job rehabilitation.  Agency Name: [facility] [employer] [facility] [place] Address: [address] [place], [place] [address] Phone: [phone] Website: www.[email or web address] Service(s) Offered: Assistance to needy in emergency with utility bills, heating fuel, and prescriptions. Shelter for homeless 7pm-7am. August 13, 2016 15  Agency Name: [facility] [place] (Developmentally Disabled) Address: [address], [place], [place] [address] Phone: [phone] Contact Person: Lemond Cart Email: [email or web address] Website: [email or web address] Service(s) Offered: Helps individuals with developmental disabilities move from housing that is more restrictive to homes where they  can achieve greater independence and have more  opportunities.  Agency Name: [facility] Address: [address], [place], [place] [address] Phone: [phone] Email: [email or web address] Website: [email or web address].org Service(s) Offered: Provides affordable housing for low-income families, elderly, and disabled individuals. Offer a wide range of  programs and services, from financial planning to afterschool and summer programs.  Agency Name: Department of Social Services Address: [address] [place], [place] [address] Phone: [phone] Service(s) Offered: Child support services; child welfare services; food stamps; Medicaid; work first family assistance; and aid with fuel,  rent, food and medicine.  Agency Name: [facility] [place] [employer]. Address: [facility] [address] [place], [place]  [address] Phone: [phone] Website: www.familyabuseservices.org Service(s) Offered: 24 hour Crisis Line: [phone]; 24 hour Emergency Shelter; Transitional Housing; Support Groups; Scientist, physiological; [employer]; Hispanic Outreach: [phone];  Visitation Center: [phone].  Agency Name:  [facility] [place]. Address: [address] [place], [place] [address] Phone: [phone] Service(s) Offered: CAP Services; Home and [employer]; Individual or Group Supports; Respite Care Non-Institutional Nursing;  Residential Supports; Respite Care and Personal Care Services; Transportation; Family and Friends Night; Recreational Activities; Three Nutritious Meals/Snacks; Consultation with Registered Dietician; Twenty-four hour Registered Nurse Access; Daily and [employer]; Camp Green Leaves; Shelly for the [employer] (During Summer Months) Bingo Night (Every  Wednesday Night); Special Populations Dance Night  (Every Tuesday Night); Professional Hair Care Services.  Agency Name: [facility] Address: [address], [place], [place] [address] Phone: [phone] Contact Person: Meade High Website: [email or web address].Physicist, medical) Offered: Residential treatment facility for women; food and  clothing, educational & employment development and  transportation to work; Counsellor of financial skills;  parenting and family reunification; emotional and spiritual  support; transitional housing for program graduates.  Agency Name: [facility] Address: [address], [place], [place] [address] Phone: [phone] Email: [email or web address] Website: [email or web address].es Service(s) Offered: Public housing units for elderly, disabled, and low income people; housing  choice vouchers for income eligible  applicants; shelter plus care vouchers; and HOPWA voucher program.  Agency Name: [facility] [place] Address: [address], [place], [place] [address] Phone: [phone] Email: [email or web address] Website: www.habitatalamance.org Service(s) Offered: Build houses for families in need of decent housing. Each adult in the family must invest 200 hours of labor on  someone  else's house, work with volunteers to build their own house, attend classes on budgeting, home maintenance, yard care, and attend homeowner association meetings.  Agency Name: [facility] Address: [address], [place], [place] [address] Phone: [phone] Website: www.rsli.org Service(s) Offered: Intermediate care facilities for intellectually delayed, Supervised Living in group homes for adults with developmental disabilities, Supervised Living for people who have dual diagnoses (MRMI), Independent Living, Supported Living, respite and a variety of CAP services, pre-vocational services, day supports, and [employer].  Agency Name: [place] [employer] Phone: [phone] Website: www.NCForeclosurePrevention.gov Service(s) Offered: Zero-interest, deferred loans to homeowners struggling to pay their mortgage. Call for more information.

## 2023-11-16 NOTE — Clinical Social Work Maternal (Signed)
 CLINICAL SOCIAL WORK MATERNAL/CHILD NOTE  Patient Details  Name: Deborah Mckee MRN: [id number] Date of Birth: [birth year]  Date:  11/16/2023  Clinical Social Worker Initiating Note:  Deborah Mckee Date/Time: Initiated:  11/16/23/1100     Child's Name:  Deborah Mckee   Biological Parents:  Mother   Need for Interpreter:  None   Reason for Referral:  Current Substance Use/Substance Use During Pregnancy     Address:  [address] [place] [address]    Phone number:  [phone] (home)     Additional phone number:   Household Members/Support Persons (HM/SP):   Household Member/Support Person 1, Household Member/Support Person 2   HM/SP Name Relationship DOB or Age  HM/SP -1 Deborah Mckee Mother 41  HM/SP -2 Deborah Mckee Step Father 25  HM/SP -3        HM/SP -4        HM/SP -5        HM/SP -6        HM/SP -7        HM/SP -8          Natural Supports (not living in the home):  Other (Comment) (Patient indentified the father of the baby, Deborah Mckee.)   Professional Supports: Therapist (Patient reports she just got into therapy but missed her appointment and does not remember her therapist name. The patient also report having a psychatirist, Deborah Mckee at [facility].)   Employment: Environmental education officer   Type of Work: Producer, television/film/video   Education:  Some Materials engineer arranged:    Surveyor, quantity Resources:  [employer]   Other Resources:  WIC (The patient reports that she will apply for food stamps.)   Cultural/Religious Considerations Which May Impact Care:    Strengths:  Ability to meet basic needs  , Compliance with medical plan   (The patient stated that she has not had her baby shower due to being admitted to the hospital but she is sure that her parents will buy her the things she needs for the baby.)   Psychotropic Medications:         Pediatrician:       Pediatrician List:   [place]    [place]      Pediatrician Fax Number:    Risk Factors/Current Problems:  Substance Use  , Mental Health Concerns     Cognitive State:  Alert     Mood/Affect:  Anxious  , Tearful     CSW Assessment:  Chart reviewed. I spoke with Deborah Mckee at bedside today. Note the patient was admitted for ruptured membranes. The patient is [redacted] weeks pregnant and is waiting to deliver baby boy Multimedia programmer. I introduced myself, my role, and the reason for consult.   I consulted with the client about positive toxicology screen for benzodiazapine, cannabis, and cocaine.  The patient reports feeling well. Deborah Mckee confirmed that her address is [address], [place], [place] [address] and her telephone number is [phone]. The patient reported that the father of the baby Is Deborah Mckee. Deborah Mckee reports having her parents, Deborah Mckee (52) and Deborah Mckee (52) as supports and she currently lives in the home with them. The patient reports that her highest level of education is some college and she is currently an employee at [employer] at [employer].  Deborah Mckee reports currently having WIC and will apply for food stamps and would like to know who to contact about doing so. I will provide the patient with [place] DSS contact information on her AVS.   The patient reports having a mental health history. The patient reports having a diagnosis of Borderline personality Disorder, Depression, Anxiety, and ADD. The patient also report having mental health medication to help treat her symptoms. Deborah. Mckee report having a therapist though medicaid but does not remember their name. Deborah Mckee also has reports having a psychiatrist, Deborah Mckee at [facility].    The patient denies any current or past SI/HI/DV.  I reviewed information on Post Partum Depression, Sudden Infant Death Syndrome, Safe Sleep Environment, Scientist, research (life sciences). I also provided her with additional  resources for mental health services and breast feeding and marijuana use.   The patient has not identified a pediatrician but reports that she will. The patient reports having a breast pump thorough medicaid. The patient stated that she was supposed to have a baby shower to get all the items for the baby but could not due to early admission to L&D. The patient report that anything that she will need her parents will provide. The patient reports that her mother will help her at discharge.   I informed the patient about state law and hospital policy about positive toxicology screens and if the baby boy Deborah Mckee has a positive screen as well then CPS is required to be contacted. The patient verbalized understanding. I informed the patient to reach out to [facility] for any additional needs.   TOC will follow patient until discharge.       CSW Plan/Description:  Perinatal Mood and Anxiety Disorder (PMADs) Education, Sudden Infant Death Syndrome (SIDS) Education, Hospital Drug Screen Policy Information, Other Information/Referral to [facility], CSW Will Continue to Monitor Umbilical Cord Tissue Drug Screen Results and Make Report if Warranted    Deborah Mckee 11/16/2023, 11:31 AM

## 2023-11-16 NOTE — Progress Notes (Signed)
 Patient wheeled to obs2 for daily NST along with sitter. Patient reports +fetal movement and scant amount of clear leakage of fluid. Denies contractions and vaginal bleeding. Tracing reviewed by JINNY Coe CNM. Patient wheeled back to 336.

## 2023-11-16 NOTE — Progress Notes (Signed)
 ANTEPARTUM PROGRESS NOTE  Deborah Mckee is a 23 y.o. G1P0000 at [redacted]w[redacted]d who is admitted for pPROM.   Estimated Date of Delivery: 12/31/23  Length of Stay:  2 Days. Admitted 11/14/2023  Subjective: Doing well, denies UCs or VB.  Reports good FM.  Vitals:  BP 102/62 (BP Location: Right Arm)   Pulse 69   Temp 97.7 F (36.5 C) (Oral)   Resp 17   Ht 5' 6 (1.676 m)   Wt 61.2 kg   LMP 03/26/2023 (Exact Date)   SpO2 100%   BMI 21.79 kg/m  Physical Examination: General:   alert and cooperative  Skin:  normal  Neurologic:    Alert & oriented x 3  Lungs:   Nl effort  Heart:   regular rate and rhythm  Abdomen:  soft, non-tender; bowel sounds normal; no masses,  no organomegaly  Extremities: : non-tender, symmetric, no edema bilaterally.       ABO/Rh     Status: None   Collection Time: 11/15/23  4:30 AM  Result Value Ref Range   ABO/RH(D)      O NEG Performed at [facility], [address] [place], [place] [address]   CBC     Status: Abnormal   Collection Time: 11/16/23  5:20 AM  Result Value Ref Range   WBC 10.4 4.0 - 10.5 K/uL   RBC 3.99 3.87 - 5.11 MIL/uL   Hemoglobin 12.1 12.0 - 15.0 g/dL   HCT 64.5 (L) 63.9 - 53.9 %   MCV 88.7 80.0 - 100.0 fL   MCH 30.3 26.0 - 34.0 pg   MCHC 34.2 30.0 - 36.0 g/dL   RDW 87.2 88.4 - 84.4 %   Platelets 142 (L) 150 - 400 K/uL   nRBC 0.0 0.0 - 0.2 %    Comment: Performed at [facility], [address] [place], [place] [address]    US  MAINE Limited Result Date: 11/15/2023 CLINICAL DATA:  Premature rupture of membranes EXAM: LIMITED OBSTETRIC ULTRASOUND COMPARISON:  None Available. FINDINGS: Number of Fetuses: 1 Heart Rate:  127 bpm Movement: Present Presentation: Cephalic Placental Location: Posterior Previa: No Amniotic Fluid (Subjective): Decreased consistent with the given clinical history. AFI: 5.6 cm FL: 6.1 cm 31 w  5 d MATERNAL FINDINGS: Cervix:  Appears closed. Uterus/Adnexae: No abnormality visualized. IMPRESSION:  Single live intrauterine gestation at 31 weeks 5 days. Decreased amniotic fluid is noted consistent with the known history of premature rupture of membranes. This exam is performed on an emergent basis and does not comprehensively evaluate fetal size, dating, or anatomy; follow-up complete OB US  should be considered if further fetal assessment is warranted. Electronically Signed   By: Oneil Devonshire M.D.   On: 11/15/2023 00:35    Current scheduled medications  ARIPiprazole   20 mg Oral Daily   [START ON 11/17/2023] erythromycin  ethylsuccinate  333 mg Oral Q8H   lithium  carbonate  600 mg Oral Daily   trazodone   50 mg Oral QHS    I have reviewed the patient's current medications.  ASSESSMENT: Patient Active Problem List   Diagnosis Date Noted   Preterm premature rupture of membranes 11/14/2023   Chlamydia trachomatis infection in mother during third trimester of pregnancy 11/10/2023   High risk pregnancy, antepartum 09/09/2023   Rh negative state in antepartum period 06/25/2023   History of substance abuse (HCC) 06/23/2023   High risk medication use 06/08/2023   Smoker 06/08/2023   Bipolar affective disorder, current episode mixed (HCC) 02/11/2021  Cocaine abuse (HCC) 02/11/2021   Chronic post-traumatic stress disorder 04/18/2018   Cannabis abuse 04/18/2018   Disruptive mood dysregulation disorder (HCC) 03/30/2018    PLAN: Continue routine antenatal care. BMZ x 2 dose, 2nd dose given 7/29@0027  -erythromycin  250 mg Q6 x 48 hours, then EES 333 mg Q 8 hours x 5 days -CBC q72 hours -NST Q shift -NICU consult completed -TOC consult ordered -Psych consult ordered -Reg diet   [name], CNM 11/16/2023 8:32 AM  [name] Certified Nurse Midwife [facility]

## 2023-11-16 NOTE — TOC Initial Note (Signed)
 Transition of Care [facility]) - Initial/Assessment Note    Patient Details  Name: Deborah Mckee MRN: [id number] Date of Birth: [birth year]  Transition of [facility]) CM/SW Contact:    Corrie JINNY Ruts, LCSW Phone Number: 11/16/2023, 12:03 PM  Clinical Narrative:                 Chart reviewed. TOC assessment completed. Waiting for the patient to give birth. Resources provided. TOC will follow up with the patient at time of birth for any additional needs.        Patient Goals and CMS Choice            Expected Discharge Plan and Services                                              Prior Living Arrangements/Services                       Activities of Daily Living   ADL Screening (condition at time of admission) Independently performs ADLs?: Yes (appropriate for developmental age) Is the patient deaf or have difficulty hearing?: No Does the patient have difficulty seeing, even when wearing glasses/contacts?: No Does the patient have difficulty concentrating, remembering, or making decisions?: No  Permission Sought/Granted                  Emotional Assessment              Admission diagnosis:  Preterm premature rupture of membranes [O42.919] Patient Active Problem List   Diagnosis Date Noted   Preterm premature rupture of membranes 11/14/2023   Chlamydia trachomatis infection in mother during third trimester of pregnancy 11/10/2023   High risk pregnancy, antepartum 09/09/2023   Rh negative state in antepartum period 06/25/2023   History of substance abuse (HCC) 06/23/2023   High risk medication use 06/08/2023   Smoker 06/08/2023   Bipolar affective disorder, current episode mixed (HCC) 02/11/2021   Cocaine abuse (HCC) 02/11/2021   Chronic post-traumatic stress disorder 04/18/2018   Cannabis abuse 04/18/2018   Disruptive mood dysregulation disorder (HCC) 03/30/2018   PCP:  Dariel Lenis, MD (Inactive) Pharmacy:   [facility] -  [place] - [address] [place] [address] Phone: [phone] Fax: [phone]  [facility] [place] - [address] [place] [address] Phone: [phone] Fax: [phone]  [id number] - [address] [place] [address] [place] [address] Phone: [phone] Fax: [phone]  [id number] [place], [place] - [address] [place] [address] Phone: [phone] Fax: [phone]     Social Drivers of Health (SDOH) Social History: SDOH Screenings   Food Insecurity: Food Insecurity Present (11/15/2023)  Housing: High Risk (11/15/2023)  Transportation Needs: No Transportation Needs (11/15/2023)  Utilities: Not At Risk (11/15/2023)  Alcohol Screen: Low Risk  (02/10/2021)  Financial Resource Strain: Low Risk  (05/05/2023)   Received from [facility]  Tobacco Use: High Risk (11/14/2023)   SDOH Interventions:     Readmission Risk Interventions     No data to display

## 2023-11-16 NOTE — Consult Note (Signed)
 [facility] Psychiatric Consult Initial  Patient Name: .Deborah Mckee  MRN: [id number]  DOB: [birth year]  Consult Order details:  Orders (From admission, onward)     Start     Ordered   11/15/23 1724  IP CONSULT TO PSYCHIATRY       Comments: Patient's mom found suicide notes in patients car today  Ordering Provider: Aisha Heller, CNM  Provider:  (Not yet assigned)  Question Answer Comment  Location [facility]   Reason for Consult? suicide ideation      11/15/23 1724             Mode of Visit: In person    Psychiatry Consult Evaluation  Service Date: November 16, 2023 LOS:  LOS: 2 days  Chief Complaint Concern for SI  Primary Psychiatric Diagnoses  Bipolar disorder Borderline Personality disorder PTSD Cocaine use disorder Cannabis use disorder   Assessment  Deborah Mckee is a 23 y.o. female admitted: Medically  She is a 23 yo G1P0 female presenting at [redacted]w[redacted]d IUP with concerns for PPROM 2 days ago without preterm labor. Pregnancy has also been complicated by extensive mental health history including bipolar affective disorder, substance abuse (UDS positive for cocaine, THC, benzos), Chlamydia infection (recent treatment, TOC positive on admission). Plan is currently for monitoring, possible induction on/after 8/1 ([redacted]w[redacted]d). Patient reported one time use of cocaine following break up with bf they wrote suicide notes as they feared they were going to lose custody of the child. They denied SI on admission and deny current. They are poorly compliant with current medications will continue lithium , level ordered. They have established outpatient follow up. Suicide precautions started for monitoring.     Diagnoses:  Active Hospital problems: Principal Problem:   Preterm premature rupture of membranes    Plan   ## Psychiatric Medication Recommendations:  Continue Lithium  600 mg daily at this time, pt reports intermittent compliance. Discontinue Abilify   20 mg as pt reports she has not been taking. Will d/c trazodone  as pt has not been taking. Discussed starting Latuda post delivery.   ## Medical Decision Making Capacity: Not specifically addressed in this encounter  ## Further Work-up:   -- Pertinent labwork reviewed earlier this admission    ## Disposition:-- Pt requires on going evaluation, started on suicide precautions, IVC if attempts to leave  ## Behavioral / Environmental: - Patients with borderline personality traits/disorder often use the language of physical pain to communicate both physical and emotional suffering. It is important to address pain complaints as they arise and attempt to identify an etiology, either organic or psychiatric. In patients with chronic pain, it is important to have a discussion with the patient about expectations about pain control. or Utilize compassion and acknowledge the patient's experiences while setting clear and realistic expectations for care.    ## Safety and Observation Level:  - Based on my clinical evaluation, I estimate the patient to be at Moderate risk of self harm in the current setting. - At this time, we recommend  1:1 Observation. This decision is based on my review of the chart including patient's history and current presentation, interview of the patient, mental status examination, and consideration of suicide risk including evaluating suicidal ideation, plan, intent, suicidal or self-harm behaviors, risk factors, and protective factors. This judgment is based on our ability to directly address suicide risk, implement suicide prevention strategies, and develop a safety plan while the patient is in the clinical setting. Please contact our team if  there is a concern that risk level has changed.  CSSR Risk Category:C-SSRS RISK CATEGORY: No Risk  Suicide Risk Assessment: Patient has following modifiable risk factors for suicide: under treated depression , medication noncompliance, and  triggering events, which we are addressing by Providing safe environment, restarting medications, and monitoring. Patient has following non-modifiable or demographic risk factors for suicide: history of suicide attempt Patient has the following protective factors against suicide: Access to outpatient mental health care, Supportive family, Supportive friends, and pregnancy  Thank you for this consult request. Recommendations have been communicated to the primary team.  We will continue to follow at this time.   Deborah Mckee Right, PA-C       History of Present Illness  Relevant Aspects of [facility]   Patient Report:  Consult requested due to undated letters found in the patient's vehicle that referenced suicidal ideation.  HPI:  Patient with psychiatric history significant for bipolar disorder, borderline personality disorder, cocaine use, cannabis use, and PTSD.  Following presentation patient was admitted medically there mother found suicide letters in their car and the primary team requested a consult given this concern.  Patient reports that they had relapse on cocaine using 1 time due to frustration about recent separation from boyfriend.  She reports following this use she is worried that this date would take away custody of the child.  She indicate following that she wrote these letters.  Of note the letters indicated the patient had plan to give birth to the child prior to harming self.  On admission patient denied ongoing thoughts of self-harm.  Today patient denies ongoing thoughts of self-harm.  No self-harm behavior has been been observed since admission.  She is future oriented.  She is hopeful that she can keep custody of the child.  She reports the stressor leading to the substance use was: On 7/20, she contacted a friend who was unexpectedly staying at her residence. During the conversation, the friend told her to accept the end of her relationship and stated that her former  partner "didn't love her." This led to an exacerbation of depressive symptoms, described by the patient as feelings of loneliness and being overwhelmed; stating she'd have to do this by herself. This ultimately triggered her cocaine relapse five days later.   Patient expresses concern that her child may be removed from her custody due to her recent substance use. She reports maintaining sobriety from cocaine for the past year, though she relapsed on 7/25 following an emotionally interaction. She continues to struggle with symptoms of depression and anxiety, which reportedly worsened after a recent breakup with her unborn child's father. She admitted to taking a Klonopin not prescribed to her, obtained from a friend, in an attempt to self-manage her heightened anxiety and depressive symptoms.   She reports inconsistent adherence to prescribed medications for bipolar disorder and endorses daily cannabis use, which she is noted to have shared with her OB and psychiatry teams. She denies current cravings for cocaine. Denies current suicidal or homicidal ideation, and also denies any thoughts of infanticide. She reports adequate appetite and sleep. Support systems include her mother and friends, which she plans to utilizes as coping mechanisms postpartum. She expresses some concerns about breast-feeding and is open to not breast-feeding discussed given that she is already on lithium  and some plan for Latuda in the future there is concern for breast-feeding and she is open to the idea of not breast-feeding.      Psych ROS:  Depression: 10/10 Anxiety:  10/10 Mania (lifetime and current): few years ago   Collateral information:  She reported that the patient has a known history of substance use. On 7/20, the patient experienced a breakup with the father of her child and was permitted to stay at her mother's residence under the condition that she abstain from drug use. During this time, the mother observed  the patient coming and going from the residence frequently and noted that the patient was approximately two months behind on both car and insurance payments. The mother temporarily left the residence to care for her own mother, returning after the patient called her with concerns about vaginal fluid leakage. On 11/15/23, while attempting to organize the patient's belongings and coordinate her care, the mother discovered letters expressing suicidal ideation. She voiced significant concern that the patient may act on these thoughts, given her history and life stressors. The mother stated that the patient cannot return to her home unless she first engages in substance abuse treatment.    Psychiatric and Social History  Psychiatric History:  Information collected from Exam and patient chart  Prev Dx/Sx: Bipolar, substance abuse Current Psych Provider: [facility] Home Meds (current): Lithium , Abilify , trazodone  Previous Med Trials: seroquel , trazodone , celexa , valium, ativan , wellbutrin, lexapro, gabapentin , intuniv, lamictal , lurasidone   Prior Psych Hospitalization: yes, May [id number]  Prior Self Harm: Yes Prior Violence: Denies  Family Psych History: Father , borderline disorder, depression; Paternal Grandfather, bipolar, depression Family Hx suicide: Father  Social History:   Educational Hx: some college Occupational Hx: Work at [employer] Hx: Denies Living Situation: previously with boyfriend but now with mother. Spiritual Hx: Christian Access to weapons/lethal means: Denies   Substance History Alcohol: Denies   Last Drink before pregnancy  History of alcohol withdrawal seizures Denies History of DT's Denies Tobacco: Vapes Illicit drugs: Cannabis, cocaine  Rehab hx: yes, unsure  Exam Findings  Physical Exam: AAO, no acute distress, respirations unlabored Vital Signs:  Temp:  [97.6 F (36.4 C)-98.1 F (36.7 C)] 98.1 F (36.7  C) (07/29 1151) Pulse Rate:  [60-75] 65 (07/29 1151) Resp:  [17-18] 18 (07/29 1151) BP: (102-118)/(62-80) 111/79 (07/29 1151) SpO2:  [98 %-100 %] 99 % (07/29 1151) Blood pressure 111/79, pulse 65, temperature 98.1 F (36.7 C), temperature source Oral, resp. rate 18, height 5' 6 (1.676 m), weight 61.2 kg, last menstrual period 03/26/2023, SpO2 99%. Body mass index is 21.79 kg/m.    Mental Status Exam: General Appearance: Casual  Orientation:  Full (Time, Place, and Person)  Memory:  Immediate;   Fair Recent;   Fair Remote;   Fair  Concentration:  Concentration: Fair and Attention Span: Fair  Recall:  Fair  Attention  Fair  Eye Contact:  Good  Speech:  Normal Rate  Language:  Fair  Volume:  Normal  Mood: Anxious  Affect:  Appropriate  Thought Process:  Linear  Thought Content:  Logical  Suicidal Thoughts:  No  Homicidal Thoughts:  No  Judgement:  Fair  Insight:  Fair  Psychomotor Activity:  Normal  Akathisia:  Negative  Fund of Knowledge:  Fair      Assets:  Manufacturing systems engineer Desire for Improvement Housing Social Support  Cognition:  WNL  ADL's:  Intact  AIMS (if indicated):        Other History   These have been pulled in through the EMR, reviewed, and updated if appropriate.  Family History:  The patient's family history is not  on file.  Medical History: Past Medical History:  Diagnosis Date   Anxiety    Chlamydia    Depression    History of cocaine abuse (HCC)    Vaccine for human papilloma virus (HPV) types 6, 11, 16, and 18 administered    Vision abnormalities    wears glasses    Surgical History: Past Surgical History:  Procedure Laterality Date   INGUINAL HERNIA PEDIATRIC WITH LAPAROSCOPIC EXAM Left 04/22/2017   Procedure: INGUINAL HERNIA PEDIATRIC WITH LAP LOOK OF PELVIC FLOOR;  Surgeon: Claudius Kaplan, MD;  Location: [facility];  Service: Pediatrics;  Laterality: Left;   INGUINAL HERNIA REPAIR Right 2008      Medications:   Current Facility-Administered Medications:    acetaminophen  (TYLENOL ) tablet 650 mg, 650 mg, Oral, Q4H PRN, Oxley, Jennifer, CNM   ARIPiprazole  (ABILIFY ) tablet 20 mg, 20 mg, Oral, Daily, Myron Nest, CNM, 20 mg at 11/16/23 1044   erythromycin  250 mg in sodium chloride  0.9 % 100 mL IVPB, 250 mg, Intravenous, Q6H, Aisha Heller, CNM, Last Rate: 100 mL/hr at 11/16/23 0601, 250 mg at 11/16/23 0601   [START ON 11/17/2023] erythromycin  ethylsuccinate (EES) 200 MG/5ML suspension 333 mg, 333 mg, Oral, Q8H, Dickerson, Felicia, CNM   lithium  carbonate (LITHOBID ) ER tablet 600 mg, 600 mg, Oral, Daily, Dickerson, Felicia, CNM, 600 mg at 11/16/23 1044   nicotine  (NICODERM CQ  - dosed in mg/24 hours) patch 21 mg, 21 mg, Transdermal, Daily PRN, Myron Nest, CNM   ondansetron  (ZOFRAN ) injection 4 mg, 4 mg, Intravenous, Q6H PRN, Myron Nest, CNM   traZODone  (DESYREL ) tablet 50 mg, 50 mg, Oral, QHS, Myron Nest, CNM, 50 mg at 11/15/23 2240  Allergies: Allergies  Allergen Reactions   Penicillins Rash and Other (See Comments)    Deborah Mckee Right, PA-C

## 2023-11-16 NOTE — Progress Notes (Signed)
 Pt Mother stated that she was cleaning out Pt's car and found several pill bottles and some notes that she is suspicious of being Suicide notes.  Mother showed pill bottles to RN, they we're expired Wellbutrin and Klonopin in pt's name.  RN told Mother to dispose of them safely and asked to keep the notes in Pt chart.  Mother of pt asked RN not to tell pt that she found the notes.  RN notified Bobbette Brunswick, CNM.  Psych consult ordered.  Suicide Assessment completed, pt denies any suicidal ideation at this time

## 2023-11-17 LAB — CBC
HCT: 36.8 % (ref 36.0–46.0)
Hemoglobin: 12.6 g/dL (ref 12.0–15.0)
MCH: 30.1 pg (ref 26.0–34.0)
MCHC: 34.2 g/dL (ref 30.0–36.0)
MCV: 87.8 fL (ref 80.0–100.0)
Platelets: 147 K/uL — ABNORMAL LOW (ref 150–400)
RBC: 4.19 MIL/uL (ref 3.87–5.11)
RDW: 12.7 % (ref 11.5–15.5)
WBC: 12 K/uL — ABNORMAL HIGH (ref 4.0–10.5)
nRBC: 0 % (ref 0.0–0.2)

## 2023-11-17 LAB — LITHIUM LEVEL: Lithium Lvl: 0.28 mmol/L — ABNORMAL LOW (ref 0.60–1.20)

## 2023-11-17 MED ORDER — TRAZODONE HCL 50 MG PO TABS
50.0000 mg | ORAL_TABLET | Freq: Once | ORAL | Status: AC
  Administered 2023-11-17: 50 mg via ORAL
  Filled 2023-11-17: qty 1

## 2023-11-17 MED ORDER — SODIUM CHLORIDE 0.9 % IV SOLN: INTRAVENOUS | Status: AC | PRN

## 2023-11-17 NOTE — Progress Notes (Signed)
 Patient tearing up, states she is feeling anxious, the FOB does not seem supportive at this time per patient- her mother and FOB were arguing last evening, she is afraid her baby will be taken from her. She said she needs help regulating her emotions. I do not want to kill myself, I want to live.  CNM and Psych PA notified Elyn Sharps, RN 11/17/23 @1110 

## 2023-11-17 NOTE — Progress Notes (Signed)
   11/17/23 1037  Fetal Heart Rate A  Mode External  Baseline Rate (A) 135 bpm  Variability 6-25 BPM  Accelerations 15 x 15  Decelerations None  Uterine Activity  Mode Toco  Contraction Frequency (min) occasional  Contraction Duration (sec) 70-90  Contraction Quality Mild  Resting Tone Palpated Relaxed  Resting Time Adequate   Reactive NST.

## 2023-11-17 NOTE — Progress Notes (Signed)
 Pt brought to OBS3 by NA for daily NST. RN initiated NST. Pt denies physical pain, but expresses concerns regarding anxiety. RN contacted Psych NP to discuss anxiety medication options. He stated that he would collaborate with CNM to determine best medication to use.  Pt appears tearful and upset.   NA 1:1 with patient.

## 2023-11-17 NOTE — Plan of Care (Signed)
 Patient was tearful and anxious at the beginning of the shift.  This afternoon patient is more calm, talkative, and content.  She has colored at the bedside with an adult coloring book and markers and showered.  Patient denies vaginal bleeding and contractions.  Patient said her vaginal leaking was more than it had been this morning and afternoon, but at this time has slowed down.  Fluid has been clear.  Vital signs WNL.  Psych PA rounded on patient today- no new orders at this time.  RN educated patient she is on bedrest with bathroom privileges, she should notify the RN if she has an increase in vaginal leaking, vaginal bleeding, chills/fever, decreased fetal movement, or contractions.  She verbalizes understanding.  Elyn Sharps, RN 11/17/23 @1720 

## 2023-11-17 NOTE — Progress Notes (Signed)
 ANTEPARTUM PROGRESS NOTE  Deborah Mckee is a 23 y.o. G1P0000 at [redacted]w[redacted]d who is admitted for pPROM.   Estimated Date of Delivery: 12/31/23  Length of Stay:  3 Days. Admitted 11/14/2023  Subjective: Doing well, denies UCs or VB.  Reports good FM.  Vitals:  BP (!) 104/54 (BP Location: Left Arm) Comment: nurse Krystle notified  Pulse 79   Temp 98.6 F (37 C) (Oral)   Resp 18   Ht 5' 6 (1.676 m)   Wt 61.2 kg   LMP 03/26/2023 (Exact Date)   SpO2 99%   BMI 21.79 kg/m  Physical Examination: General:   alert and cooperative  Skin:  normal  Neurologic:    Alert & oriented x 3  Lungs:   Nl effort  Heart:   regular rate and rhythm  Abdomen:  soft, non-tender; bowel sounds normal; no masses,  no organomegaly  Extremities: : non-tender, symmetric, no edema bilaterally.       No results found for this or any previous visit (from the past 24 hours).    Current scheduled medications  erythromycin  ethylsuccinate  333 mg Oral Q8H   famotidine   20 mg Oral Daily   lithium  carbonate  600 mg Oral Daily    I have reviewed the patient's current medications.  ASSESSMENT: Patient Active Problem List   Diagnosis Date Noted   Bipolar disorder current episode depressed (HCC) 11/16/2023   Preterm premature rupture of membranes 11/14/2023   Chlamydia trachomatis infection in mother during third trimester of pregnancy 11/10/2023   High risk pregnancy, antepartum 09/09/2023   Rh negative state in antepartum period 06/25/2023   History of substance abuse (HCC) 06/23/2023   High risk medication use 06/08/2023   Smoker 06/08/2023   Cocaine abuse (HCC) 02/11/2021   Chronic post-traumatic stress disorder 04/18/2018   Cannabis abuse 04/18/2018   Disruptive mood dysregulation disorder (HCC) 03/30/2018    PLAN: Continue routine antenatal care. BMZ x 2 dose, 2nd dose given 7/29@0027  -EES 333 mg Q 8 hours x 5 days -CBC q72 hours -NST Q shift -NICU consult completed -TOC consult ordered -Psych  consult ordered -Reg diet   Deborah Mckee, CNM 11/17/2023 8:36 AM  Deborah Mckee Certified Nurse Midwife [facility]

## 2023-11-18 LAB — TYPE AND SCREEN
ABO/RH(D): O NEG
Antibody Screen: POSITIVE

## 2023-11-18 NOTE — Progress Notes (Signed)
 Spoke with Deborah Mckee, CNM.  Pt is experiencing meconium stained fluid and notified this Nurse that she is starting to feel mild contractions in her lower abdomen and low back.  Moving her to Birthplace to place pt on the monitor.

## 2023-11-18 NOTE — Consult Note (Signed)
 [facility] Psychiatric Consult Initial  Patient Name: .Deborah Mckee  MRN: [id number]  DOB: 14-Dec-2000  Consult Order details:  Orders (From admission, onward)     Start     Ordered   11/15/23 1724  IP CONSULT TO PSYCHIATRY       Comments: Patient's mom found suicide notes in patients car today  Ordering Provider: Aisha Heller, CNM  Provider:  (Not yet assigned)  Question Answer Comment  Location [facility]   Reason for Consult? suicide ideation      11/15/23 1724             Mode of Visit: In person    Psychiatry Consult Evaluation  Service Date: November 18, 2023 LOS:  LOS: 4 days  Chief Complaint Concern for SI  Primary Psychiatric Diagnoses  Bipolar disorder Borderline Personality disorder PTSD Cocaine use disorder Cannabis use disorder   Assessment  Deborah Mckee is a 23 y.o. female admitted: Medically  She is a 23 yo G1P0 female presenting at [redacted]w[redacted]d IUP with concerns for PPROM 2 days ago without preterm labor. Pregnancy has also been complicated by extensive mental health history including bipolar affective disorder, substance abuse (UDS positive for cocaine, THC, benzos), Chlamydia infection (recent treatment, TOC positive on admission). Plan is currently for monitoring, possible induction on/after 8/1 ([redacted]w[redacted]d). Patient reported one time use of cocaine following break up with bf they wrote suicide notes as they feared they were going to lose custody of the child. They denied SI on admission and deny current. They are poorly compliant with current medications will continue lithium , level ordered. They have established outpatient follow up. Suicide precautions started for monitoring.   7/30: Patient doing well today. Is working on using her coping skills and is open to intensive outpatient treatment. Denies SI, continues to indicate it was in the context of fear of losing her baby. Plans to go stay at mother's house, feels has good social  support from friend group. Is agreeable to continue with outpatient psych follow up. Demonstrates good insisght into the need for medication compliance. Will continue SI precautions, no self harm behavior has been observed, affect was bright. Lithium  level 0.28 likely due to poor compliance and increased clearance.     Diagnoses:  Active Hospital problems: Principal Problem:   Preterm premature rupture of membranes Active Problems:   Bipolar disorder current episode depressed (HCC)    Plan   ## Psychiatric Medication Recommendations:  Continue Lithium  600 mg daily at this time, pt reports intermittent compliance. Discontinue Abilify  20 mg as pt reports she has not been taking. Will d/c trazodone  as pt has not been taking. Discussed starting Latuda post delivery.   ## Medical Decision Making Capacity: Not specifically addressed in this encounter  ## Further Work-up:   -- Pertinent labwork reviewed earlier this admission    ## Disposition:-- Pt requires on going evaluation, started on suicide precautions, IVC if attempts to leave  ## Behavioral / Environmental: - Patients with borderline personality traits/disorder often use the language of physical pain to communicate both physical and emotional suffering. It is important to address pain complaints as they arise and attempt to identify an etiology, either organic or psychiatric. In patients with chronic pain, it is important to have a discussion with the patient about expectations about pain control. or Utilize compassion and acknowledge the patient's experiences while setting clear and realistic expectations for care.    ## Safety and Observation Level:  - Based on  my clinical evaluation, I estimate the patient to be at Moderate risk of self harm in the current setting. - At this time, we recommend  1:1 Observation. This decision is based on my review of the chart including patient's history and current presentation, interview of the  patient, mental status examination, and consideration of suicide risk including evaluating suicidal ideation, plan, intent, suicidal or self-harm behaviors, risk factors, and protective factors. This judgment is based on our ability to directly address suicide risk, implement suicide prevention strategies, and develop a safety plan while the patient is in the clinical setting. Please contact our team if there is a concern that risk level has changed.  CSSR Risk Category:C-SSRS RISK CATEGORY: No Risk  Suicide Risk Assessment: Patient has following modifiable risk factors for suicide: under treated depression , medication noncompliance, and triggering events, which we are addressing by Providing safe environment, restarting medications, and monitoring. Patient has following non-modifiable or demographic risk factors for suicide: history of suicide attempt Patient has the following protective factors against suicide: Access to outpatient mental health care, Supportive family, Supportive friends, and pregnancy  Thank you for this consult request. Recommendations have been communicated to the primary team.  We will continue to follow at this time.   Deborah Mckee Right, PA-C       History of Present Illness  Relevant Aspects [facility]   Patient Report:  Consult requested due to undated letters found in the patient's vehicle that referenced suicidal ideation.  On follow-up patient is alert and oriented.  They are pleasant and cooperative on exam.  They noted some increased anxiety earlier in the day but state that it improved once they received nicotine  patch. They continue to deny SI/HI/AVH. They are optimistic that their baby will do well in the NICU and want to be their to support the baby. They are linear, logical, and future oriented. Continue to endorse on going depression. Are compliant with lithium  and discuss the extensive conversations they had with their outpatient provider about  lithium  and pregnancy. Discussed breastfeeding. Discussed coping skills. Patient contracts for safety     Psych ROS:  Depression: improved 8/10 down from 10/10 Anxiety:  anxiety 6/10 down from 10/10 Mania (lifetime and current): few years ago   Collateral information:  7/29She reported that the patient has a known history of substance use. On 7/20, the patient experienced a breakup with the father of her child and was permitted to stay at her mother's residence under the condition that she abstain from drug use. During this time, the mother observed the patient coming and going from the residence frequently and noted that the patient was approximately two months behind on both car and insurance payments. The mother temporarily left the residence to care for her own mother, returning after the patient called her with concerns about vaginal fluid leakage. On 11/15/23, while attempting to organize the patient's belongings and coordinate her care, the mother discovered letters expressing suicidal ideation. She voiced significant concern that the patient may act on these thoughts, given her history and life stressors. The mother stated that the patient cannot return to her home unless she first engages in substance abuse treatment.    Psychiatric and Social History  Psychiatric History:  Information collected from Exam and patient chart  Prev Dx/Sx: Bipolar, substance abuse Current Psych Provider: [facility] Home Meds (current): Lithium , Abilify , trazodone  Previous Med Trials: seroquel , trazodone , celexa , valium, ativan , wellbutrin, lexapro, gabapentin , intuniv, lamictal ,  lurasidone   Prior Psych Hospitalization: yes, May [id number]  Prior Self Harm: Yes Prior Violence: Denies  Family Psych History: Father , borderline disorder, depression; Paternal Grandfather, bipolar, depression Family Hx suicide: Father  Social History:   Educational Hx: some  college Occupational Hx: Work at [employer] Hx: Denies Living Situation: previously with boyfriend but now with mother. Spiritual Hx: Christian Access to weapons/lethal means: Denies   Substance History Alcohol: Denies   Last Drink before pregnancy  History of alcohol withdrawal seizures Denies History of DT's Denies Tobacco: Vapes Illicit drugs: Cannabis, cocaine  Rehab hx: yes, unsure  Exam Findings  Physical Exam: AAO, no acute distress, respirations unlabored Vital Signs:  Temp:  [97.5 F (36.4 C)-98 F (36.7 C)] 97.5 F (36.4 C) (07/31 0711) Pulse Rate:  [50-70] 50 (07/31 0711) Resp:  [18-20] 18 (07/31 0711) BP: (106-120)/(61-85) 110/66 (07/31 0711) SpO2:  [97 %-100 %] 100 % (07/31 0711) Blood pressure 110/66, pulse (!) 50, temperature (!) 97.5 F (36.4 C), temperature source Oral, resp. rate 18, height 5' 6 (1.676 m), weight 61.2 kg, last menstrual period 03/26/2023, SpO2 100%. Body mass index is 21.79 kg/m.    Mental Status Exam: General Appearance: Casual  Orientation:  Full (Time, Place, and Person)  Memory:  Immediate;   Fair Recent;   Fair Remote;   Fair  Concentration:  Concentration: Fair and Attention Span: Fair  Recall:  Fair  Attention  Fair  Eye Contact:  Good  Speech:  Normal Rate  Language:  Fair  Volume:  Normal  Mood: Anxious  Affect:  Appropriate  Thought Process:  Linear  Thought Content:  Logical  Suicidal Thoughts:  No  Homicidal Thoughts:  No  Judgement:  Fair  Insight:  Fair  Psychomotor Activity:  Normal  Akathisia:  Negative  Fund of Knowledge:  Fair      Assets:  Manufacturing systems engineer Desire for Improvement Housing Social Support  Cognition:  WNL  ADL's:  Intact  AIMS (if indicated):        Other History   These have been pulled in through the EMR, reviewed, and updated if appropriate.  Family History:  The patient's family history is not on file.  Medical History: Past Medical History:  Diagnosis Date    Anxiety    Chlamydia    Depression    History of cocaine abuse (HCC)    Vaccine for human papilloma virus (HPV) types 6, 11, 16, and 18 administered    Vision abnormalities    wears glasses    Surgical History: Past Surgical History:  Procedure Laterality Date   INGUINAL HERNIA PEDIATRIC WITH LAPAROSCOPIC EXAM Left 04/22/2017   Procedure: INGUINAL HERNIA PEDIATRIC WITH LAP LOOK OF PELVIC FLOOR;  Surgeon: Claudius Kaplan, MD;  Location: [facility];  Service: Pediatrics;  Laterality: Left;   INGUINAL HERNIA REPAIR Right 2008     Medications:   Current Facility-Administered Medications:    0.9 %  sodium chloride  infusion, , Intravenous, PRN, Myron Nest, CNM, Last Rate: 10 mL/hr at 11/17/23 1233, New Bag at 11/17/23 1233   acetaminophen  (TYLENOL ) tablet 650 mg, 650 mg, Oral, Q4H PRN, Myron Nest, CNM   calcium  carbonate (TUMS - dosed in mg elemental calcium ) chewable tablet 200 mg of elemental calcium , 1 tablet, Oral, BID PRN, Schermerhorn, Demetra V, MD, 200 mg of elemental calcium  at 11/16/23 1839   erythromycin  ethylsuccinate (EES) 200 MG/5ML suspension 333 mg, 333 mg, Oral, Q8H, Dickerson, Felicia, CNM, 333 mg at 11/18/23 [phone]  famotidine  (PEPCID ) tablet 20 mg, 20 mg, Oral, Daily, Schermerhorn, Demetra V, MD, 20 mg at 11/17/23 9043   lithium  carbonate (LITHOBID ) ER tablet 600 mg, 600 mg, Oral, Daily, Dickerson, Felicia, CNM, 600 mg at 11/17/23 9043   nicotine  (NICODERM CQ  - dosed in mg/24 hours) patch 21 mg, 21 mg, Transdermal, Daily PRN, Myron Nest, CNM, 21 mg at 11/17/23 1055   ondansetron  (ZOFRAN ) injection 4 mg, 4 mg, Intravenous, Q6H PRN, Myron Nest, CNM  Allergies: Allergies  Allergen Reactions   Penicillins Rash and Other (See Comments)    Deborah Mckee Right, PA-C

## 2023-11-18 NOTE — Progress Notes (Signed)
 ANTEPARTUM PROGRESS NOTE  Deborah Mckee is a 23 y.o. G1P0000 at [redacted]w[redacted]d who is admitted for PPROM.  Estimated Date of Delivery: 12/31/23  Length of Stay:  4 Days. Admitted 11/14/2023  Subjective: She reports:  -active fetal movement - leakage of fluid with change in color to yellow/green -no vaginal bleeding - contractions every 30 min rated 6/10  Vitals:  BP 111/62 (BP Location: Left Arm)   Pulse 62   Temp 97.8 F (36.6 C) (Oral)   Resp 19   Ht 5' 6 (1.676 m)   Wt 61.2 kg   LMP 03/26/2023 (Exact Date)   SpO2 100%   BMI 21.79 kg/m  Physical Examination: OBGyn Exam  WDU:[id number]: rule out uterine contractions and PPROM Baseline FHR: 135 beats/min Variability: moderate Accelerations: present Decelerations: absent Tocometry: 13-27 mins  Interpretation:   RESULTS:  A NST procedure was performed with FHR monitoring and a normal baseline established, appropriate time of 20-40 minutes of evaluation, and accels >2 seen w 15x15 characteristics.  Results show a REACTIVE NST.    Bobbette Brunswick, CNM   Results for orders placed or performed during the hospital encounter of 11/14/23 (from the past 48 hours)  Lithium  level     Status: Abnormal   Collection Time: 11/17/23 10:21 AM  Result Value Ref Range   Lithium  Lvl 0.28 (L) 0.60 - 1.20 mmol/L    Comment: Performed at [facility], [address] [place], [place] [address]  CBC     Status: Abnormal   Collection Time: 11/17/23 10:49 PM  Result Value Ref Range   WBC 12.0 (H) 4.0 - 10.5 K/uL   RBC 4.19 3.87 - 5.11 MIL/uL   Hemoglobin 12.6 12.0 - 15.0 g/dL   HCT 63.1 63.9 - 53.9 %   MCV 87.8 80.0 - 100.0 fL   MCH 30.1 26.0 - 34.0 pg   MCHC 34.2 30.0 - 36.0 g/dL   RDW 87.2 88.4 - 84.4 %   Platelets 147 (L) 150 - 400 K/uL   nRBC 0.0 0.0 - 0.2 %    Comment: Performed at [facility], [address] [place], [place] [address]  Type and screen [facility]     Status: None    Collection Time: 11/17/23 10:49 PM  Result Value Ref Range   ABO/RH(D) O NEG    Antibody Screen POS    Sample Expiration 11/20/2023,2359    Antibody Identification      PASSIVELY ACQUIRED ANTI-D Performed at [facility], [address] [place], [place] [address]     No results found.  Current scheduled medications  erythromycin  ethylsuccinate  333 mg Oral Q8H   famotidine   20 mg Oral Daily   lithium  carbonate  600 mg Oral Daily    I have reviewed the patient's current medications.  ASSESSMENT: Patient Active Problem List   Diagnosis Date Noted   Bipolar disorder current episode depressed (HCC) 11/16/2023   Preterm premature rupture of membranes 11/14/2023   Chlamydia trachomatis infection in mother during third trimester of pregnancy 11/10/2023   High risk pregnancy, antepartum 09/09/2023   Rh negative state in antepartum period 06/25/2023   History of substance abuse (HCC) 06/23/2023   High risk medication use 06/08/2023   Smoker 06/08/2023   Cocaine abuse (HCC) 02/11/2021   Chronic post-traumatic stress disorder 04/18/2018   Cannabis abuse 04/18/2018   Disruptive mood dysregulation disorder (HCC) 03/30/2018    PLAN: Continue routine antenatal care. BMZ x 2 dose, 2nd dose  given 7/29@0027  -EES 333 mg Q 8 hours x 5 days -CBC q72 hours -NST Q shift -NICU consult completed -TOC consult ordered -Psych consult ordered -Reg diet -continue 1:1 sitter for suicide precautions -monitor for signs of active labor  BOBBETTE BRUNSWICK, CNM 11/18/2023 11:39 AM  BOBBETTE BRUNSWICK Certified Nurse Midwife [facility]

## 2023-11-18 NOTE — TOC Progression Note (Addendum)
 Transition of Care [facility]) - Progression Note    Patient Details  Name: [name] WASSER MRN: [id number] Date of Birth: Sep 25, 2000  Transition of [facility]) CM/SW Contact  Lauraine JAYSON Carpen, LCSW Phone Number: 11/18/2023, 8:35 AM  Clinical Narrative:   CSW acknowledges consult Patient has questions about medicaid for herself and baby. Would like to speak with someone who can help her with this. Patient has [facility] Medicaid listed on her facesheet. Financial counselor is aware and will follow up.  1:53 pm: Per Artist, I met with Ms. Anctil this morning.  I have informed her newborn will receive auto newborn Medicaid because she is currently receiving [facility] Medicaid benefits.   (Also, I advised her to notify [place] DSS she has moved and lives in [place].) Let my business card if she has additional questions in the future.  This request has been completed.  Expected Discharge Plan and Services                                               Social Drivers of Health (SDOH) Interventions SDOH Screenings   Food Insecurity: Food Insecurity Present (11/15/2023)  Housing: High Risk (11/15/2023)  Transportation Needs: No Transportation Needs (11/15/2023)  Utilities: Not At Risk (11/15/2023)  Alcohol Screen: Low Risk  (02/10/2021)  Financial Resource Strain: Low Risk  (05/05/2023)   Received from [facility]  Tobacco Use: High Risk (11/14/2023)    Readmission Risk Interventions     No data to display

## 2023-11-19 LAB — CBC
HCT: 43.2 % (ref 36.0–46.0)
Hemoglobin: 14.6 g/dL (ref 12.0–15.0)
MCH: 30 pg (ref 26.0–34.0)
MCHC: 33.8 g/dL (ref 30.0–36.0)
MCV: 88.9 fL (ref 80.0–100.0)
Platelets: 185 K/uL (ref 150–400)
RBC: 4.86 MIL/uL (ref 3.87–5.11)
RDW: 12.8 % (ref 11.5–15.5)
WBC: 11.5 K/uL — ABNORMAL HIGH (ref 4.0–10.5)
nRBC: 0 % (ref 0.0–0.2)

## 2023-11-19 NOTE — Progress Notes (Addendum)
 ANTEPARTUM PROGRESS NOTE  Deborah Mckee is a 23 y.o. G1P0000 at [redacted]w[redacted]d who is admitted for PROM.  Estimated Date of Delivery: 12/31/23  Length of Stay:  5 Days. Admitted 11/14/2023  Subjective: She reports feeling well, still leaking amniotic fluid. She reports the amniotic fluid is still yellow/green, which started yesterday. She denies any foul odors, pain, or fevers.  Patient reports good fetal movement.  She reports no uterine contractions, no bleeding.  Vitals:  Blood pressure 107/75, pulse (!) 54, temperature 98.1 F (36.7 C), temperature source Oral, resp. rate 18, height 5' 6 (1.676 m), weight 61.2 kg, last menstrual period 03/26/2023, SpO2 99%. Physical Examination:      General:   alert and cooperative   Skin:  normal   Neurologic:    Alert & oriented x 3   Lungs:   Nl effort   Heart:   regular rate and rhythm   Abdomen:  soft, non-tender; bowel sounds normal; no masses,  no organomegaly   Extremities: : non-tender, symmetric, no edema bilaterally.       Results for orders placed or performed during the hospital encounter of 11/14/23 (from the past 48 hours)  CBC     Status: Abnormal   Collection Time: 11/17/23 10:49 PM  Result Value Ref Range   WBC 12.0 (H) 4.0 - 10.5 K/uL   RBC 4.19 3.87 - 5.11 MIL/uL   Hemoglobin 12.6 12.0 - 15.0 g/dL   HCT 63.1 63.9 - 53.9 %   MCV 87.8 80.0 - 100.0 fL   MCH 30.1 26.0 - 34.0 pg   MCHC 34.2 30.0 - 36.0 g/dL   RDW 87.2 88.4 - 84.4 %   Platelets 147 (L) 150 - 400 K/uL   nRBC 0.0 0.0 - 0.2 %    Comment: Performed at [facility], [address] [place], [place] [address]  Type and screen [facility]     Status: None   Collection Time: 11/17/23 10:49 PM  Result Value Ref Range   ABO/RH(D) O NEG    Antibody Screen POS    Sample Expiration 11/20/2023,2359    Antibody Identification      PASSIVELY ACQUIRED ANTI-D Performed at [facility], [address] [place], [place] [address]   CBC      Status: Abnormal   Collection Time: 11/19/23  9:25 AM  Result Value Ref Range   WBC 11.5 (H) 4.0 - 10.5 K/uL   RBC 4.86 3.87 - 5.11 MIL/uL   Hemoglobin 14.6 12.0 - 15.0 g/dL   HCT 56.7 63.9 - 53.9 %   MCV 88.9 80.0 - 100.0 fL   MCH 30.0 26.0 - 34.0 pg   MCHC 33.8 30.0 - 36.0 g/dL   RDW 87.1 88.4 - 84.4 %   Platelets 185 150 - 400 K/uL   nRBC 0.0 0.0 - 0.2 %    Comment: Performed at [facility], [address] [place], [place] [address]    No results found.  Current scheduled medications  famotidine   20 mg Oral Daily   lithium  carbonate  600 mg Oral Daily    I have reviewed the patient's current medications.  ASSESSMENT: Patient Active Problem List   Diagnosis Date Noted   Bipolar disorder current episode depressed (HCC) 11/16/2023   Preterm premature rupture of membranes 11/14/2023   Chlamydia trachomatis infection in mother during third trimester of pregnancy 11/10/2023   High risk pregnancy, antepartum 09/09/2023   Rh negative state in antepartum period  06/25/2023   History of substance abuse (HCC) 06/23/2023   High risk medication use 06/08/2023   Smoker 06/08/2023   Cocaine abuse (HCC) 02/11/2021   Chronic post-traumatic stress disorder 04/18/2018   Cannabis abuse 04/18/2018   Disruptive mood dysregulation disorder (HCC) 03/30/2018    PLAN:  Continue routine antenatal care. BMZ x 2 doses complete, 2nd dose received 07/29 @ 0027 EES 333mg  q8hrs completed CBC q72hrs NST q shift Psych saw her today and lifted 1:1 suicide precautions, patient denies any plans to harm herself Will move to L&D and start IOL once a labor room is available, patient is aware and agreeable with the plan of care  Edsel Charlies Blush, CNM 11/19/2023 11:16 AM

## 2023-11-19 NOTE — Progress Notes (Signed)
 Sitter called out to RN at 0000 stating that pt was feeling gushes of fluid from her vagina and having contractions. RN to pt room. Pad was just changed by pt and scant yellow/brown fluid observed. Checked pt pad that she had on and scant fluid observed on pad with one episode of a slight trickle that was less than 5 seconds. Pt noted she was having a contraction. She was able to talk through it and rated the pain 5/10. Abdomen was semi firm and resolved within a minute. Aisha, CNM notified, transferred at [facility] to L&D for monitoring. Pt back to MB at 0208.

## 2023-11-19 NOTE — Consult Note (Signed)
 [facility]  Patient Name: .Deborah Mckee  MRN: [id number]  DOB: Sep 09, 2000  Consult Order details:  Orders (From admission, onward)     Start     Ordered   11/15/23 1724  IP CONSULT TO PSYCHIATRY       Comments: Patient's mom found suicide notes in patients car today  Ordering Provider: Aisha Heller, CNM  Provider:  (Not yet assigned)  Question Answer Comment  [facility]   Reason for Consult? suicide ideation      11/15/23 1724             Mode of Visit: In person    Psychiatry Consult Evaluation  Service Date: November 19, 2023 LOS:  LOS: 5 days  Chief Complaint Concern for SI  Primary Psychiatric Diagnoses  Bipolar disorder Borderline Personality disorder PTSD Cocaine use disorder Cannabis use disorder   Assessment  Deborah Mckee is a 23 y.o. female admitted: Medically  She is a 23 yo G1P0 female presenting at [redacted]w[redacted]d IUP with concerns for PPROM 2 days ago without preterm labor. Pregnancy has also been complicated by extensive mental health history including bipolar affective disorder, substance abuse (UDS positive for cocaine, THC, benzos), Chlamydia infection (recent treatment, TOC positive on admission). Plan is currently for monitoring, possible induction on/after 8/1 ([redacted]w[redacted]d). Patient reported one time use of cocaine following break up with bf they wrote suicide notes as they feared they were going to lose custody of the child. They denied SI on admission and deny current. They are poorly compliant with current medications will continue lithium , level ordered. They have established outpatient follow up. Suicide precautions started for monitoring.   7/30: Patient doing well today. Is working on using her coping skills and is open to intensive outpatient treatment. Denies SI, continues to indicate it was in the context of fear of losing her baby. Plans to go stay at mother's house, feels has good social  support from friend group. Is agreeable to continue with outpatient psych follow up. Demonstrates good insisght into the need for medication compliance. Will continue SI precautions, no self harm behavior has been observed, affect was bright. Lithium  level 0.28 likely due to poor compliance and increased clearance.   8/1: Suicide precautions discontinued.  Primary team updated.  Attending psychiatrist Dr. Jadapalle updated, weekend C/L team to follow-up with patient after delivery regarding further medication adjustments.  Follow-up instructions have also been placed in patient's charts for outpatient psychotherapy and to follow-up with their established psychiatry provider.  Plan was for induction today.    Diagnoses:  Active Hospital problems: Principal Problem:   Preterm premature rupture of membranes Active Problems:   Bipolar disorder current episode depressed (HCC)    Plan   ## Psychiatric Medication Recommendations:  Continue Lithium  600 mg daily at this time, pt reports intermittent compliance. Discontinue Abilify  20 mg as pt reports she has not been taking. Will d/c trazodone  as pt has not been taking. Discussed starting Latuda post delivery.   ## Medical Decision Making Capacity: Not specifically addressed in this encounter  ## Further Work-up:   -- Pertinent labwork reviewed earlier this admission    ## Disposition:-- Pt requires on going evaluation, started on suicide precautions, IVC if attempts to leave  ## Behavioral / Environmental: - Patients with borderline personality traits/disorder often use the language of physical pain to communicate both physical and emotional suffering. It is important to address pain complaints as they arise and attempt to  identify an etiology, either organic or psychiatric. In patients with chronic pain, it is important to have a discussion with the patient about expectations about pain control. or Utilize compassion and acknowledge the patient's  experiences while setting clear and realistic expectations for care.    ## Safety and Observation Level:  - Based on my clinical evaluation, I estimate the patient to be at Moderate risk of self harm in the current setting. - At this time, we recommend  1:1 Observation. This decision is based on my review of the chart including patient's history and current presentation, interview of the patient, mental status examination, and consideration of suicide risk including evaluating suicidal ideation, plan, intent, suicidal or self-harm behaviors, risk factors, and protective factors. This judgment is based on our ability to directly address suicide risk, implement suicide prevention strategies, and develop a safety plan while the patient is in the clinical setting. Please contact our team if there is a concern that risk level has changed.  CSSR Risk Category:C-SSRS RISK CATEGORY: No Risk  Suicide Risk Assessment: Patient has following modifiable risk factors for suicide: under treated depression , medication noncompliance, and triggering events, which we are addressing by Providing safe environment, restarting medications, and monitoring. Patient has following non-modifiable or demographic risk factors for suicide: history of suicide attempt Patient has the following protective factors against suicide: Access to outpatient mental health care, Supportive family, Supportive friends, and pregnancy  Thank you for this consult request. Recommendations have been communicated to the primary team.  We will continue to follow at this time.   [name] Right, PA-C       History of Present Illness  Relevant Aspects of [facility]   Patient Report:  Consult requested due to undated letters found in the patient's vehicle that referenced suicidal ideation.   8/1: Follow-up patient is found laying in bed they are alert and oriented.  They are pleasant and cooperative on exam.  Affect is bright.   They are excited that today they are being induced.  They note they are anxious about childbirth but otherwise feel well.  They report they have visited with their mother who is very supportive of them.  They report they have also met with the child's father and they are working on defining the relationship.  They continue to deny SI, HI, and AVH.  They have had a sitter for the past several days and no self-harm behavior has been observed.  They are linear logical and future oriented on exam.  They demonstrate good insight into the need for outpatient follow-up and ongoing medication compliance.  Again discussed her current medications and breast-feeding.  Patient indicates do not plan to breast-feed.  Patient indicates they plan to name the child Elijah.  Discussed utilizing coping mechanisms.  Patient feels that they would be able to utilize coping mechanisms.  Discussed crisis resources.  Patient discusses that they believe they would be able to utilize these resources in a time of crisis.  They voiced no concerns or complaints at this time.   7/30: On follow-up patient is alert and oriented.  They are pleasant and cooperative on exam.  They noted some increased anxiety earlier in the day but state that it improved once they received nicotine  patch. They continue to deny SI/HI/AVH. They are optimistic that their baby will do well in the NICU and want to be their to support the baby. They are linear, logical, and future oriented. Continue to endorse on going depression.  Are compliant with lithium  and discuss the extensive conversations they had with their outpatient provider about lithium  and pregnancy. Discussed breastfeeding. Discussed coping skills. Patient contracts for safety     Psych ROS:  Depression: improved 8/10 down from 10/10 Anxiety:  anxiety 6/10 down from 10/10 Mania (lifetime and current): few years ago   Collateral information:  7/29She reported that the patient has a known history  of substance use. On 7/20, the patient experienced a breakup with the father of her child and was permitted to stay at her mother's residence under the condition that she abstain from drug use. During this time, the mother observed the patient coming and going from the residence frequently and noted that the patient was approximately two months behind on both car and insurance payments. The mother temporarily left the residence to care for her own mother, returning after the patient called her with concerns about vaginal fluid leakage. On 11/15/23, while attempting to organize the patient's belongings and coordinate her care, the mother discovered letters expressing suicidal ideation. She voiced significant concern that the patient may act on these thoughts, given her history and life stressors. The mother stated that the patient cannot return to her home unless she first engages in substance abuse treatment.    Psychiatric and Social History  Psychiatric History:  Information collected from Exam and patient chart  Prev Dx/Sx: Bipolar, substance abuse Current Psych Provider: [facility] Home Meds (current): Lithium , Abilify , trazodone  Previous Med Trials: seroquel , trazodone , celexa , valium, ativan , wellbutrin, lexapro, gabapentin , intuniv, lamictal , lurasidone   Prior Psych Hospitalization: yes, May [address]75  Prior Self Harm: Yes Prior Violence: Denies  Family Psych History: Father , borderline disorder, depression; Paternal Grandfather, bipolar, depression Family Hx suicide: Father  Social History:   Educational Hx: some college Occupational Hx: Work at [employer] Hx: Denies Living Situation: previously with boyfriend but now with mother. Spiritual Hx: Christian Access to weapons/lethal means: Denies   Substance History Alcohol: Denies   Last Drink before pregnancy  History of alcohol withdrawal seizures Denies History of DT's  Denies Tobacco: Vapes Illicit drugs: Cannabis, cocaine  Rehab hx: yes, unsure  Exam Findings  Physical Exam: AAO, no acute distress, respirations unlabored Vital Signs:  Temp:  [97.9 F (36.6 C)-98.8 F (37.1 C)] 98.8 F (37.1 C) (08/01 1505) Pulse Rate:  [49-84] 66 (08/01 1505) Resp:  [16-20] 18 (08/01 1505) BP: (91-116)/(49-75) 106/63 (08/01 1505) SpO2:  [97 %-100 %] 98 % (08/01 1505) Blood pressure 106/63, pulse 66, temperature 98.8 F (37.1 C), temperature source Oral, resp. rate 18, height 5' 6 (1.676 m), weight 61.2 kg, last menstrual period 03/26/2023, SpO2 98%. Body mass index is 21.79 kg/m.    Mental Status Exam: General Appearance: Casual  Orientation:  Full (Time, Place, and Person)  Memory:  Immediate;   Fair Recent;   Fair Remote;   Fair  Concentration:  Concentration: Fair and Attention Span: Fair  Recall:  Fair  Attention  Fair  Eye Contact:  Good  Speech:  Normal Rate  Language:  Fair  Volume:  Normal  Mood: Anxious  Affect:  Appropriate  Thought Process:  Linear  Thought Content:  Logical  Suicidal Thoughts:  No  Homicidal Thoughts:  No  Judgement:  Fair  Insight:  Fair  Psychomotor Activity:  Normal  Akathisia:  Negative  Fund of Knowledge:  Fair      Assets:  Manufacturing systems engineer Desire for Improvement Housing Social Support  Cognition:  WNL  ADL's:  Intact  AIMS (if indicated):        Other History   These have been pulled in through the EMR, reviewed, and updated if appropriate.  Family History:  The patient's family history is not on file.  Medical History: Past Medical History:  Diagnosis Date   Anxiety    Chlamydia    Depression    History of cocaine abuse (HCC)    Vaccine for human papilloma virus (HPV) types 6, 11, 16, and 18 administered    Vision abnormalities    wears glasses    Surgical History: Past Surgical History:  Procedure Laterality Date   INGUINAL HERNIA PEDIATRIC WITH LAPAROSCOPIC EXAM Left 04/22/2017    Procedure: INGUINAL HERNIA PEDIATRIC WITH LAP LOOK OF PELVIC FLOOR;  Surgeon: Claudius Kaplan, MD;  Location: [facility];  Service: Pediatrics;  Laterality: Left;   INGUINAL HERNIA REPAIR Right 2008     Medications:   Current Facility-Administered Medications:    acetaminophen  (TYLENOL ) tablet 650 mg, 650 mg, Oral, Q4H PRN, Myron Nest, CNM   calcium  carbonate (TUMS - dosed in mg elemental calcium ) chewable tablet 200 mg of elemental calcium , 1 tablet, Oral, BID PRN, Schermerhorn, Demetra V, MD, 200 mg of elemental calcium  at 11/16/23 1839   famotidine  (PEPCID ) tablet 20 mg, 20 mg, Oral, Daily, Schermerhorn, Demetra V, MD, 20 mg at 11/19/23 1225   lithium  carbonate (LITHOBID ) ER tablet 600 mg, 600 mg, Oral, Daily, Dickerson, Felicia, CNM, 600 mg at 11/19/23 1226   nicotine  (NICODERM CQ  - dosed in mg/24 hours) patch 21 mg, 21 mg, Transdermal, Daily PRN, Myron Nest, CNM, 21 mg at 11/18/23 1122   ondansetron  (ZOFRAN ) injection 4 mg, 4 mg, Intravenous, Q6H PRN, Myron Nest, CNM  Allergies: Allergies  Allergen Reactions   Penicillins Rash and Other (See Comments)    [name] Right, PA-C

## 2023-11-20 ENCOUNTER — Inpatient Hospital Stay: Admitting: Anesthesiology

## 2023-11-20 ENCOUNTER — Encounter
Admission: EM | Disposition: A | Discharge status: Other Acute Inpatient Hospital | Payer: Self-pay | Source: Home / Self Care | Attending: Obstetrics

## 2023-11-20 DIAGNOSIS — O42913 Preterm premature rupture of membranes, unspecified as to length of time between rupture and onset of labor, third trimester: Principal | ICD-10-CM

## 2023-11-20 LAB — GROUP B STREP BY PCR: Group B strep by PCR: NEGATIVE

## 2023-11-20 SURGERY — Surgical Case
Anesthesia: Epidural

## 2023-11-20 MED ORDER — SODIUM CHLORIDE 0.9 % IV SOLN
INTRAVENOUS | Status: AC
  Filled 2023-11-20: qty 5

## 2023-11-20 MED ORDER — FENTANYL-BUPIVACAINE-NACL 0.5-0.125-0.9 MG/250ML-% EP SOLN
12.0000 mL/h | EPIDURAL | Status: DC | PRN
  Administered 2023-11-20: 12 mL/h via EPIDURAL

## 2023-11-20 MED ORDER — MORPHINE SULFATE (PF) 0.5 MG/ML IJ SOLN
INTRAMUSCULAR | Status: DC | PRN
  Administered 2023-11-20: 3 mg via EPIDURAL

## 2023-11-20 MED ORDER — LACTATED RINGERS IV SOLN: 500.0000 mL | Freq: Once | INTRAVENOUS | Status: AC

## 2023-11-20 MED ORDER — TRANEXAMIC ACID-NACL 1000-0.7 MG/100ML-% IV SOLN
INTRAVENOUS | Status: DC | PRN
  Administered 2023-11-20: 1000 mg via INTRAVENOUS

## 2023-11-20 MED ORDER — LIDOCAINE 2% (20 MG/ML) 5 ML SYRINGE
INTRAMUSCULAR | Status: DC | PRN
  Administered 2023-11-20 (×2): 5 mL via INTRAVENOUS
  Administered 2023-11-20: 7 mL via INTRAVENOUS

## 2023-11-20 MED ORDER — FENTANYL CITRATE (PF) 100 MCG/2ML IJ SOLN: 100.0000 ug | Freq: Once | INTRAMUSCULAR | Status: AC

## 2023-11-20 MED ORDER — METHYLERGONOVINE MALEATE 0.2 MG/ML IJ SOLN
INTRAMUSCULAR | Status: DC | PRN
  Administered 2023-11-20: .2 mg via INTRAMUSCULAR

## 2023-11-20 MED ORDER — FENTANYL CITRATE (PF) 100 MCG/2ML IJ SOLN: 50.0000 ug | INTRAMUSCULAR | Status: DC | PRN

## 2023-11-20 MED ORDER — TERBUTALINE SULFATE 1 MG/ML IJ SOLN
0.2500 mg | Freq: Once | INTRAMUSCULAR | Status: AC | PRN
  Administered 2023-11-20: 0.25 mg via SUBCUTANEOUS
  Filled 2023-11-20: qty 1

## 2023-11-20 MED ORDER — PROMETHAZINE HCL 25 MG/ML IJ SOLN
INTRAMUSCULAR | Status: AC
  Filled 2023-11-20: qty 1

## 2023-11-20 MED ORDER — LIDOCAINE HCL (PF) 1 % IJ SOLN
INTRAMUSCULAR | Status: DC | PRN
  Administered 2023-11-20: 1 mL via SUBCUTANEOUS

## 2023-11-20 MED ORDER — SODIUM CHLORIDE 0.9 % IV SOLN
500.0000 mg | INTRAVENOUS | Status: AC
  Administered 2023-11-20: 500 mg via INTRAVENOUS

## 2023-11-20 MED ORDER — PHENYLEPHRINE HCL-NACL 20-0.9 MG/250ML-% IV SOLN
INTRAVENOUS | Status: AC
Start: 2023-11-20 — End: 2023-11-20
  Filled 2023-11-20: qty 250

## 2023-11-20 MED ORDER — PHENYLEPHRINE 80 MCG/ML (10ML) SYRINGE FOR IV PUSH (FOR BLOOD PRESSURE SUPPORT): 80.0000 ug | PREFILLED_SYRINGE | INTRAVENOUS | Status: DC | PRN

## 2023-11-20 MED ORDER — BUPIVACAINE HCL (PF) 0.5 % IJ SOLN
INTRAMUSCULAR | Status: AC
  Filled 2023-11-20: qty 30

## 2023-11-20 MED ORDER — AMMONIA AROMATIC IN INHA
RESPIRATORY_TRACT | Status: AC
  Filled 2023-11-20: qty 10

## 2023-11-20 MED ORDER — FENTANYL-BUPIVACAINE-NACL 0.5-0.125-0.9 MG/250ML-% EP SOLN
EPIDURAL | Status: AC
  Filled 2023-11-20: qty 250

## 2023-11-20 MED ORDER — OXYTOCIN 10 UNIT/ML IJ SOLN
INTRAMUSCULAR | Status: AC
  Filled 2023-11-20: qty 2

## 2023-11-20 MED ORDER — OXYTOCIN-SODIUM CHLORIDE 30-0.9 UT/500ML-% IV SOLN
1.0000 m[IU]/min | INTRAVENOUS | Status: DC
  Administered 2023-11-20: 30 [IU] via INTRAVENOUS
  Filled 2023-11-20: qty 500

## 2023-11-20 MED ORDER — DIPHENHYDRAMINE HCL 50 MG/ML IJ SOLN: 12.5000 mg | INTRAMUSCULAR | Status: DC | PRN

## 2023-11-20 MED ORDER — PROMETHAZINE HCL 25 MG/ML IJ SOLN
INTRAMUSCULAR | Status: DC | PRN
Start: 2023-11-20 — End: 2023-11-21
  Administered 2023-11-20: 12.5 mg via INTRAMUSCULAR

## 2023-11-20 MED ORDER — EPHEDRINE 5 MG/ML INJ: 10.0000 mg | INTRAVENOUS | Status: DC | PRN

## 2023-11-20 MED ORDER — MORPHINE SULFATE (PF) 0.5 MG/ML IJ SOLN
INTRAMUSCULAR | Status: AC
  Filled 2023-11-20: qty 10

## 2023-11-20 MED ORDER — METHYLERGONOVINE MALEATE 0.2 MG/ML IJ SOLN
INTRAMUSCULAR | Status: AC
  Filled 2023-11-20: qty 1

## 2023-11-20 MED ORDER — PROPOFOL 10 MG/ML IV BOLUS
INTRAVENOUS | Status: AC
  Filled 2023-11-20: qty 20

## 2023-11-20 MED ORDER — FENTANYL CITRATE (PF) 100 MCG/2ML IJ SOLN
INTRAMUSCULAR | Status: DC | PRN
  Administered 2023-11-20: 100 ug via EPIDURAL

## 2023-11-20 MED ORDER — SOD CITRATE-CITRIC ACID 500-334 MG/5ML PO SOLN: 30.0000 mL | ORAL | Status: DC

## 2023-11-20 MED ORDER — MISOPROSTOL 200 MCG PO TABS
ORAL_TABLET | ORAL | Status: AC
  Filled 2023-11-20: qty 4

## 2023-11-20 MED ORDER — TRANEXAMIC ACID-NACL 1000-0.7 MG/100ML-% IV SOLN
INTRAVENOUS | Status: AC
  Filled 2023-11-20: qty 100

## 2023-11-20 MED ORDER — ONDANSETRON HCL 4 MG/2ML IJ SOLN
INTRAMUSCULAR | Status: DC | PRN
  Administered 2023-11-20: 4 mg via INTRAVENOUS

## 2023-11-20 MED ORDER — BUPIVACAINE HCL (PF) 0.25 % IJ SOLN
INTRAMUSCULAR | Status: AC
  Filled 2023-11-20: qty 30

## 2023-11-20 MED ORDER — LIDOCAINE HCL (PF) 1 % IJ SOLN
INTRAMUSCULAR | Status: AC
  Filled 2023-11-20: qty 30

## 2023-11-20 MED ORDER — FENTANYL CITRATE (PF) 100 MCG/2ML IJ SOLN
INTRAMUSCULAR | Status: AC
  Administered 2023-11-20: 100 ug via INTRAVENOUS
  Filled 2023-11-20: qty 2

## 2023-11-20 MED ORDER — FENTANYL CITRATE (PF) 100 MCG/2ML IJ SOLN
INTRAMUSCULAR | Status: AC
  Filled 2023-11-20: qty 2

## 2023-11-20 MED ORDER — MISOPROSTOL 50MCG HALF TABLET
50.0000 ug | ORAL_TABLET | ORAL | Status: DC | PRN
  Administered 2023-11-20 (×2): 50 ug via ORAL
  Filled 2023-11-20 (×2): qty 1

## 2023-11-20 MED ORDER — OXYTOCIN-SODIUM CHLORIDE 30-0.9 UT/500ML-% IV SOLN
INTRAVENOUS | Status: AC
  Administered 2023-11-20: 2 m[IU]/min via INTRAVENOUS
  Filled 2023-11-20: qty 500

## 2023-11-20 MED ORDER — LACTATED RINGERS IV SOLN: INTRAVENOUS | Status: DC

## 2023-11-20 MED ORDER — CEFAZOLIN SODIUM-DEXTROSE 2-4 GM/100ML-% IV SOLN: 2.0000 g | INTRAVENOUS | Status: DC

## 2023-11-20 MED ORDER — CEFAZOLIN SODIUM-DEXTROSE 2-3 GM-%(50ML) IV SOLR
INTRAVENOUS | Status: DC | PRN
  Administered 2023-11-20: 2 g via INTRAVENOUS

## 2023-11-20 MED ORDER — CEFAZOLIN SODIUM-DEXTROSE 2-4 GM/100ML-% IV SOLN
INTRAVENOUS | Status: AC
  Filled 2023-11-20: qty 100

## 2023-11-20 MED ORDER — PHENYLEPHRINE HCL (PRESSORS) 10 MG/ML IV SOLN
INTRAVENOUS | Status: DC | PRN
  Administered 2023-11-20 (×3): 160 ug via INTRAVENOUS

## 2023-11-20 MED ORDER — LIDOCAINE-EPINEPHRINE (PF) 1.5 %-1:200000 IJ SOLN
INTRAMUSCULAR | Status: DC | PRN
  Administered 2023-11-20: 3 mL via EPIDURAL

## 2023-11-20 NOTE — Consult Note (Signed)
 [facility]  Patient Name: .ROOSEVELT BISHER  MRN: [id number]  DOB: [birth year]  Consult Order details:  Orders (From admission, onward)     Start     Ordered   11/15/23 1724  IP CONSULT TO PSYCHIATRY       Comments: Patient's mom found suicide notes in patients car today  Ordering Provider: Aisha Heller, CNM  Provider:  (Not yet assigned)  Question Answer Comment  [facility]   Reason for Consult? suicide ideation      11/15/23 1724             Mode of Visit: In person    Psychiatry Consult Evaluation  Service Date: November 20, 2023 LOS:  LOS: 6 days  Chief Complaint depression   Primary Psychiatric Diagnoses  Bipolar disorder   Assessment  Deborah Mckee is a 23 y.o. female admitted: Medically   She is a 23 yo G1P0 female presenting at [redacted]w[redacted]d IUP with concerns for PPROM 2 days ago without preterm labor. Pregnancy has also been complicated by extensive mental health history including bipolar affective disorder, substance abuse (UDS positive for cocaine, THC, benzos), Chlamydia infection (recent treatment, TOC positive on admission). Plan is currently for monitoring, possible induction on/after 8/1 ([redacted]w[redacted]d). Patient reported one time use of cocaine following break up with bf they wrote suicide notes as they feared they were going to lose custody of the child. They denied SI on admission and deny current. They are poorly compliant with current medications will continue lithium , level ordered. They have established outpatient follow up. Suicide precautions started for monitoring.    7/30: Patient doing well today. Is working on using her coping skills and is open to intensive outpatient treatment. Denies SI, continues to indicate it was in the context of fear of losing her baby. Plans to go stay at mother's house, feels has good social support from friend group. Is agreeable to continue with outpatient psych follow up.  Demonstrates good insisght into the need for medication compliance. Will continue SI precautions, no self harm behavior has been observed, affect was bright. Lithium  level 0.28 likely due to poor compliance and increased clearance.    8/1: Suicide precautions discontinued.  Primary team updated.  Attending psychiatrist Dr. Jadapalle updated, weekend C/L team to follow-up with patient after delivery regarding further medication adjustments.  Follow-up instructions have also been placed in patient's charts for outpatient psychotherapy and to follow-up with their established psychiatry provider.  Plan was for induction today.   8/2: Patient is in bright affect today. Induction began today. She denies depressed mood, mood instability, suicidal thoughts. Has been compliant with treatment plan. Her mother and significant other are at bedside. She denies any concerns at this time and is aware of her treatment plan.   Diagnoses:  Active Hospital problems: Principal Problem:   Preterm premature rupture of membranes Active Problems:   Bipolar disorder current episode depressed (HCC)    Plan   ## Psychiatric Medication Recommendations:  Continue Lithium  600 mg daily at this time, pt reports intermittent compliance. Discontinue Abilify  20 mg as pt reports she has not been taking. Will d/c trazodone  as pt has not been taking. Discussed starting Latuda  post delivery.   ## Medical Decision Making Capacity: Not specifically addressed in this encounter  ## Further Work-up:   - -- Pertinent labwork reviewed earlier this admission   ## Disposition:--Pt requires on going evaluation, started on suicide precautions, IVC if attempts to leave   ##  Behavioral / Environmental: - Patients with borderline personality traits/disorder often use the language of physical pain to communicate both physical and emotional suffering. It is important to address pain complaints as they arise and attempt to identify an  etiology, either organic or psychiatric. In patients with chronic pain, it is important to have a discussion with the patient about expectations about pain control.    ## Safety and Observation Level:  - Based on my clinical evaluation, I estimate the patient to be at moderate risk of self harm in the current setting. - At this time, we recommend  routine. This decision is based on my review of the chart including patient's history and current presentation, interview of the patient, mental status examination, and consideration of suicide risk including evaluating suicidal ideation, plan, intent, suicidal or self-harm behaviors, risk factors, and protective factors. This judgment is based on our ability to directly address suicide risk, implement suicide prevention strategies, and develop a safety plan while the patient is in the clinical setting. Please contact our team if there is a concern that risk level has changed.  CSSR Risk Category:C-SSRS RISK CATEGORY: No Risk  Suicide Risk Assessment: Patient has following modifiable risk factors for suicide: medication noncompliance and active mental illness (to encompass adhd, tbi, mania, psychosis, trauma reaction), which we are addressing by including family in treatment planning. Patient has following non-modifiable or demographic risk factors for suicide: history of self harm behavior Patient has the following protective factors against suicide: Supportive family and Supportive friends  Thank you for this consult request. Recommendations have been communicated to the primary team.  We will continue to follow at this time.   Deborah Ethridge B Dare Sanger, Deborah Mckee       History of Present Illness  Relevant Aspects of [facility]   Patient Report:  8/2: Patient is in bright affect today. Induction began today. She denies depressed mood, mood instability, suicidal thoughts. Has been compliant with treatment plan. Her mother and significant other are at  bedside. She denies any concerns at this time and is aware of her treatment plan.    Psych ROS:  Depression: improving  Anxiety:  improving Mania (lifetime and current): history Psychosis: (lifetime and current): denies  Collateral information:  NA    Psychiatric and Social History  Psychiatric History:  Information collected from patient, mother, and chart  Prev Dx/Sx: bipolar substance abuse Current Psych Provider: Mliss Parsley Home Meds (current): see chart Previous Med Trials: see chart Therapy: will establish on 11/22/23  Prior Psych Hospitalization: May 2024  Prior Self Harm: yes Prior Violence: denies  Family Psych History: father BPD Family Hx suicide: denies  Social History:   Educational Hx: some college Occupational Hx: works Development worker, community Hx: denies Living Situation: with mother Spiritual Hx: chrisitan Access to weapons/lethal means: denies   Substance History Alcohol: denies  Type of alcohol denies  Last Drink denies  Number of drinks per day denies  History of alcohol withdrawal seizures denies  History of DT's denies  Tobacco: denies  Illicit drugs: denies  Prescription drug abuse: denies  Rehab hx: denies   Exam Findings  Physical Exam: I agree with initial exam Vital Signs:  Temp:  [97.5 F (36.4 C)-98.8 F (37.1 C)] 98.8 F (37.1 C) (08/02 1628) Pulse Rate:  [52-68] 65 (08/02 1629) Resp:  [16-20] 16 (08/02 1629) BP: (100-113)/(50-71) 100/50 (08/02 1629) SpO2:  [95 %-100 %] 100 % (08/02 0923) Blood pressure (!) 100/50, pulse 65, temperature 98.8 F (37.1 C),  temperature source Oral, resp. rate 16, height 5' 6 (1.676 m), weight 61.2 kg, last menstrual period 03/26/2023, SpO2 100%. Body mass index is 21.79 kg/m.    Mental Status Exam: General Appearance: Well Groomed  Orientation:  Full (Time, Place, and Person)  Memory:  Immediate;   Good Recent;   Good Remote;   Good  Concentration:  Concentration: Good and Attention Span:  Good  Recall:  Good  Attention  Good  Eye Contact:  Good  Speech:  Normal Rate  Language:  Good  Volume:  Normal  Mood: euthymic  Affect:  Appropriate  Thought Process:  Goal Directed  Thought Content:  WDL  Suicidal Thoughts:  No  Homicidal Thoughts:  No  Judgement:  Good  Insight:  Fair  Psychomotor Activity:  Normal  Akathisia:  Negative  Fund of Knowledge:  Good      Assets:  Desire for Improvement  Cognition:  WNL  ADL's:  Intact  AIMS (if indicated):        Other History   These have been pulled in through the EMR, reviewed, and updated if appropriate.  Family History:  The patient's family history is not on file.  Medical History: Past Medical History:  Diagnosis Date   Anxiety    Chlamydia    Depression    History of cocaine abuse (HCC)    Vaccine for human papilloma virus (HPV) types 6, 11, 16, and 18 administered    Vision abnormalities    wears glasses    Surgical History: Past Surgical History:  Procedure Laterality Date   INGUINAL HERNIA PEDIATRIC WITH LAPAROSCOPIC EXAM Left 04/22/2017   Procedure: INGUINAL HERNIA PEDIATRIC WITH LAP LOOK OF PELVIC FLOOR;  Surgeon: Claudius Kaplan, MD;  Location: [facility];  Service: Pediatrics;  Laterality: Left;   INGUINAL HERNIA REPAIR Right 2008     Medications:   Current Facility-Administered Medications:    acetaminophen  (TYLENOL ) tablet 650 mg, 650 mg, Oral, Q4H PRN, Myron Nest, CNM   ammonia  inhalant, , , ,    calcium  carbonate (TUMS - dosed in mg elemental calcium ) chewable tablet 200 mg of elemental calcium , 1 tablet, Oral, BID PRN, Schermerhorn, Demetra V, MD, 200 mg of elemental calcium  at 11/16/23 1839   famotidine  (PEPCID ) tablet 20 mg, 20 mg, Oral, Daily, Schermerhorn, Demetra V, MD, 20 mg at 11/20/23 0945   lactated ringers  infusion, , Intravenous, Continuous, Wilson, Edsel Fuller, CNM, Stopped at 11/20/23 1222   lidocaine  (PF) (XYLOCAINE ) 1 % injection, , , ,    lithium   carbonate (LITHOBID ) ER tablet 600 mg, 600 mg, Oral, Daily, Dickerson, Felicia, CNM, 600 mg at 11/20/23 0945   misoprostol  (CYTOTEC ) 200 MCG tablet, , , ,    misoprostol  (CYTOTEC ) tablet 50 mcg, 50 mcg, Oral, Q4H PRN, Wilson, Danielle Renee, CNM, 50 mcg at 11/20/23 1626   nicotine  (NICODERM CQ  - dosed in mg/24 hours) patch 21 mg, 21 mg, Transdermal, Daily PRN, Myron Nest, CNM, 21 mg at 11/18/23 1122   ondansetron  (ZOFRAN ) injection 4 mg, 4 mg, Intravenous, Q6H PRN, Myron Nest, CNM   oxytocin  (PITOCIN ) 10 UNIT/ML injection, , , ,    oxytocin  (PITOCIN ) IV infusion 30 units in NS 500 mL - Premix, 1-40 milli-units/min, Intravenous, Titrated, Wilson, Danielle Renee, CNM   Oxytocin -Sodium Chloride  30-0.9 UT/500ML-% infusion, , , ,    terbutaline  (BRETHINE ) injection 0.25 mg, 0.25 mg, Subcutaneous, Once PRN, Tanda, Edsel Fuller, CNM  Allergies: Allergies  Allergen Reactions   Penicillins Rash and Other (  See Comments)    Daine KATHEE Ober, Deborah Mckee

## 2023-11-20 NOTE — H&P (Signed)
 OB History & Physical   History of Present Illness:  Chief Complaint:   HPI:  Deborah Mckee is a 23 y.o. G1P0000 female at [redacted]w[redacted]d dated by LMP.  She presents to L&D for PPROM on 11/13/21. She is betamethasone  complete as of 11/15/21 @ 0027. She is 23wks and would like to be induced for the PPROM.  Pregnancy Issues: Smoker Rh neg - Rhogam completed 7/2  Current substance abuse Recent CT infection Bipolar affective disorder - Lithium  300mg  daily, Buspar 30mg  bid, Abilify  15mg , followed by Mliss Parsley, PA with [facility] in [place], lithium  levels to be managed by PCP  Chronic post traumatic stress disorder   Maternal Medical History:   Past Medical History:  Diagnosis Date   Anxiety    Chlamydia    Depression    History of cocaine abuse (HCC)    Vaccine for human papilloma virus (HPV) types 6, 11, 16, and 18 administered    Vision abnormalities    wears glasses    Past Surgical History:  Procedure Laterality Date   INGUINAL HERNIA PEDIATRIC WITH LAPAROSCOPIC EXAM Left 04/22/2017   Procedure: INGUINAL HERNIA PEDIATRIC WITH LAP LOOK OF PELVIC FLOOR;  Surgeon: Claudius Kaplan, MD;  Location: [facility];  Service: Pediatrics;  Laterality: Left;   INGUINAL HERNIA REPAIR Right 2008    Allergies  Allergen Reactions   Penicillins Rash and Other (See Comments)    Prior to Admission medications   Medication Sig Start Date End Date Taking? Authorizing Provider  ARIPiprazole  (ABILIFY ) 20 MG tablet  08/12/21  Yes [provider]  lithium  carbonate (ESKALITH ) 450 MG ER tablet Take 450 mg by mouth at bedtime. 07/24/22  Yes [provider]  lithium  carbonate (LITHOBID ) 300 MG CR tablet 300 mg at bedtime. TAKE 1 TABLET BY MOUTH NIGHTLY (ALONG WITH 450 MG 08/12/21  Yes [provider]  metroNIDAZOLE  (FLAGYL ) 500 MG tablet Take 500 mg by mouth 3 (three) times daily.   Yes [provider]  trazodone  (DESYREL ) 300 MG tablet Take 300 mg  by mouth at bedtime.   Yes [provider]  diazepam (VALIUM) 5 MG tablet Take 2.5-5 mg by mouth daily as needed. Patient not taking: Reported on 11/14/2023 07/24/22   [provider]  fluconazole  (DIFLUCAN ) 150 MG tablet 1 PO x 1 dose. May repeat in 3 days if needed Patient not taking: Reported on 09/12/2022 11/27/21   Copland, Alicia B, PA-C  lisdexamfetamine (VYVANSE) 40 MG capsule Take 40 mg by mouth every morning. Patient not taking: Reported on 09/12/2022    [provider]  nicotine  (NICODERM CQ  - DOSED IN MG/24 HOURS) 21 mg/24hr patch Place 1 patch (21 mg total) onto the skin daily. Patient not taking: Reported on 11/13/2021 02/18/21   Lynnette Barter, MD   Prenatal care site: [facility] [place])  Social History: She  reports that she has been smoking e-cigarettes. She has never used smokeless tobacco. She reports current drug use. Drugs: Marijuana and Cocaine. She reports that she does not drink alcohol.  Family History: family history is not on file.   Review of Systems: A full review of systems was performed and negative except as noted in the HPI.     Physical Exam:  Vital Signs: BP 112/69   Pulse 68   Temp 98 F (36.7 C) (Oral)   Resp 18   Ht 5' 6 (1.676 m)   Wt 61.2 kg   LMP 03/26/2023 (Exact Date)   SpO2  100%   BMI 21.79 kg/m  General: no acute distress.  HEENT: normocephalic, atraumatic Heart: regular rate & rhythm.  No murmurs/rubs/gallops Lungs: clear to auscultation bilaterally, normal respiratory effort Abdomen: soft, gravid, non-tender;  EFW: 4lb Pelvic:   External: Normal external female genitalia  Cervix: Dilation: Fingertip / Effacement (%): 50 /      Extremities: non-tender, symmetric, mild edema bilaterally.  DTRs: +2  Neurologic: Alert & oriented x 3.    No results found for this or any previous visit (from the past 24 hours).  Pertinent Results:  Prenatal Labs: Blood type/Rh O negative  Antibody screen neg   Rubella Immune  Varicella Immune  RPR NR  HBsAg Neg  HIV NR  GC neg  Chlamydia Positive 11/05/23  Genetic screening   1 hour GTT 58  3 hour GTT   GBS Negative by PCR 11/20/23   FHT: 140bpm, moderate variability, accelerations present, no decelerations, Category I tracing TOCO: none SVE:  Dilation: Fingertip / Effacement (%): 50 /      Cephalic by leopolds, confirmed with BSUS  US  OB Limited Result Date: 11/15/2023 CLINICAL DATA:  Premature rupture of membranes EXAM: LIMITED OBSTETRIC ULTRASOUND COMPARISON:  None Available. FINDINGS: Number of Fetuses: 1 Heart Rate:  127 bpm Movement: Present Presentation: Cephalic Placental Location: Posterior Previa: No Amniotic Fluid (Subjective): Decreased consistent with the given clinical history. AFI: 5.6 cm FL: 6.1 cm 31 w  5 d MATERNAL FINDINGS: Cervix:  Appears closed. Uterus/Adnexae: No abnormality visualized. IMPRESSION: Single live intrauterine gestation at 31 weeks 5 days. Decreased amniotic fluid is noted consistent with the known history of premature rupture of membranes. This exam is performed on an emergent basis and does not comprehensively evaluate fetal size, dating, or anatomy; follow-up complete OB US  should be considered if further fetal assessment is warranted. Electronically Signed   By: Oneil Devonshire M.D.   On: 11/15/2023 00:35    Assessment:  Deborah Mckee is a 23 y.o. G1P0000 female at [redacted]w[redacted]d with PPROM and IOL.   Plan:  1. Admit to Labor & Delivery; consents reviewed and obtained - Dr. Verdon notified of starting induction  2. Fetal Well being  - Fetal Tracing: Category I tracing - Group B Streptococcus ppx indicated: n/a, GBS negative by PCR 11/20/23 - Presentation: vertex confirmed by SVE and BSUS   3. Routine OB: - Prenatal labs reviewed, as above - Rh negative, check infant cord blood after delivery to determine need for Rhogam - CBC, T&S, RPR on admit - Clear fluids, saline lock  4. Induction of Labor -   Contractions rare, external toco in place -  Pelvis adequate for trial of labor -  Plan for induction with oral cytotec , pitocin  -  Plan for continuous fetal monitoring  -  Maternal pain control as desired; requesting regional anesthesia - Anticipate vaginal delivery  5. Post Partum Planning: - Infant feeding: formula feeding - Contraception: undecided  6. PPROM: - received BMZ x2 - received latency antibiotics, completed - confirmed position with BSUS - begin IOL since 34wks  7. [redacted] weeks gestation: - NICU consult completed, NICU aware  8. Positive drug screen: - NICU aware - anesthesia aware - social work aware  9. Suicidal ideation: - Was cleared by psychiatry, they will follow her after delivery - 1:1 safety sitter discontinued yesterday - Lithium  ER 600mg  daily  10. Chlamydia positive: - positive on 11/14/23 - treated with erythromycin  for latency antibiotics/PPROM - NICU notified for infant erythromycin  ophthalmic ointment  Edsel Charlies Blush, CNM 11/20/23 12:17 PM

## 2023-11-20 NOTE — Anesthesia Procedure Notes (Signed)
 Epidural Patient location during procedure: OB Start time: 11/20/2023 10:20 PM End time: 11/20/2023 10:35 PM  Staffing Anesthesiologist: Vicci Camellia Glatter, MD Performed: anesthesiologist   Preanesthetic Checklist Completed: patient identified, IV checked, site marked, risks and benefits discussed, surgical consent, monitors and equipment checked, pre-op evaluation and timeout performed  Epidural Patient position: sitting Prep: ChloraPrep Patient monitoring: heart rate, continuous pulse ox and blood pressure Approach: midline Location: L3-L4 Injection technique: LOR saline  Needle:  Needle type: Tuohy  Needle gauge: 18 G Needle length: 9 cm Needle insertion depth: 4 cm Catheter type: closed end Catheter size: 20 Guage Catheter at skin depth: 9 cm Test dose: negative and 1.5% lidocaine  with Epi 1:200 K  Assessment Events: blood not aspirated, no cerebrospinal fluid, injection not painful, no injection resistance and no paresthesia  Additional Notes Reason for block:procedure for pain

## 2023-11-20 NOTE — Anesthesia Preprocedure Evaluation (Addendum)
 Anesthesia Evaluation  Patient identified by MRN, date of birth, ID band Patient awake    Reviewed: Allergy & Precautions, H&P , NPO status , Patient's Chart, lab work & pertinent test results  Airway Mallampati: II  TM Distance: >3 FB Neck ROM: full    Dental no notable dental hx.    Pulmonary Current Smoker   Pulmonary exam normal        Cardiovascular Exercise Tolerance: Good negative cardio ROS Normal cardiovascular exam     Neuro/Psych  PSYCHIATRIC DISORDERS         GI/Hepatic negative GI ROS,,,(+)     substance abuse  cocaine use  Endo/Other    Renal/GU   negative genitourinary   Musculoskeletal   Abdominal   Peds  Hematology negative hematology ROS (+)   Anesthesia Other Findings Pt is non-toxic with no acute psychotic features or SI. She expressed using cocaine once recently with no other drug use. She has been evaluated by psychiatry for SI.   Past Medical History: No date: Anxiety No date: Chlamydia No date: Depression No date: History of cocaine abuse (HCC) No date: Vaccine for human papilloma virus (HPV) types 6, 11, 16, and  18 administered No date: Vision abnormalities     Comment:  wears glasses  Past Surgical History: 04/22/2017: INGUINAL HERNIA PEDIATRIC WITH LAPAROSCOPIC EXAM; Left     Comment:  Procedure: INGUINAL HERNIA PEDIATRIC WITH LAP LOOK OF               PELVIC FLOOR;  Surgeon: Claudius Kaplan, MD;  Location:               [facility];  Service: Pediatrics;                Laterality: Left; 2008: INGUINAL HERNIA REPAIR; Right  BMI    Body Mass Index: 21.79 kg/m      Reproductive/Obstetrics (+) Pregnancy                              Anesthesia Physical Anesthesia Plan  ASA: 2  Anesthesia Plan: Epidural   Post-op Pain Management:    Induction: Intravenous  PONV Risk Score and Plan:   Airway Management Planned:   Additional  Equipment:   Intra-op Plan:   Post-operative Plan:   Informed Consent: I have reviewed the patients History and Physical, chart, labs and discussed the procedure including the risks, benefits and alternatives for the proposed anesthesia with the patient or authorized representative who has indicated his/her understanding and acceptance.       Plan Discussed with: Anesthesiologist and CRNA  Anesthesia Plan Comments: (UPDATE: Pt in need of cesarean section due to variable lates. Pt is HDS. Epidural was stopped once notified. The use of the epidural with backup GA was discussed with the patient. )         Anesthesia Quick Evaluation

## 2023-11-20 NOTE — Progress Notes (Signed)
 Labor Progress Note  Deborah Mckee is a 23 y.o. G1P0000 at [redacted]w[redacted]d by LMP admitted for PPROM  Subjective: she is requesting an epidural  Objective: BP 107/71   Pulse 66   Temp 99 F (37.2 C) (Oral)   Resp 16   Ht 5' 6 (1.676 m)   Wt 61.2 kg   LMP 03/26/2023 (Exact Date)   SpO2 100%   BMI 21.79 kg/m  Notable VS details: reviewed  Fetal Assessment: FHT:  FHR: 155 bpm, variability: moderate,  accelerations:  Present,  decelerations:  Present indeterminate decelerations Category/reactivity:  Category II UC:   regular, every 2-3 minutes SVE:    Dilation: 1cm  Effacement: 80%  Station:  -1  Consistency: medium  Position: posterior  Membrane status:PPROM 11/14/23 Amniotic color: clear  Labs: Lab Results  Component Value Date   WBC 11.5 (H) 11/19/2023   HGB 14.6 11/19/2023   HCT 43.2 11/19/2023   MCV 88.9 11/19/2023   PLT 185 11/19/2023    Assessment / Plan: 23 year old G1P0 at [redacted]w[redacted]d with IOL for PPROM  Labor: Received 2 doses of oral 50mcg cytotec , now on pitocin , pitocin  at 52mu/min. Continue pitocin  titration Preeclampsia:  labs stable Fetal Wellbeing:  Category II, indeterminate fetal heart rate decelerations, resolved with position changes and IVF bolus Pain Control:  Epidural and received 1 dose IV fentanyl  I/D:  GBS negative by PCR Anticipated MOD:  NSVD  Edsel Charlies Blush, CNM 11/20/2023, 10:20 PM

## 2023-11-21 ENCOUNTER — Encounter
Admission: EM | Disposition: A | Discharge status: Other Acute Inpatient Hospital | Payer: Self-pay | Source: Home / Self Care | Attending: Obstetrics

## 2023-11-21 ENCOUNTER — Inpatient Hospital Stay: Admitting: Registered Nurse

## 2023-11-21 ENCOUNTER — Other Ambulatory Visit: Payer: Self-pay

## 2023-11-21 DIAGNOSIS — Z98891 History of uterine scar from previous surgery: Principal | ICD-10-CM

## 2023-11-21 HISTORY — PX: LAPAROTOMY: SHX154

## 2023-11-21 LAB — CBC
HCT: 36.9 % (ref 36.0–46.0)
Hemoglobin: 12.1 g/dL (ref 12.0–15.0)
MCH: 29.3 pg (ref 26.0–34.0)
MCHC: 32.8 g/dL (ref 30.0–36.0)
MCV: 89.3 fL (ref 80.0–100.0)
Platelets: 145 K/uL — ABNORMAL LOW (ref 150–400)
RBC: 4.13 MIL/uL (ref 3.87–5.11)
RDW: 12.5 % (ref 11.5–15.5)
WBC: 17.1 K/uL — ABNORMAL HIGH (ref 4.0–10.5)
nRBC: 0 % (ref 0.0–0.2)

## 2023-11-21 LAB — TYPE AND SCREEN
ABO/RH(D): O NEG
Antibody Screen: POSITIVE

## 2023-11-21 SURGERY — LAPAROTOMY
Anesthesia: Choice | Site: Abdomen

## 2023-11-21 MED ORDER — OXYTOCIN-SODIUM CHLORIDE 30-0.9 UT/500ML-% IV SOLN
2.5000 [IU]/h | INTRAVENOUS | Status: AC
  Administered 2023-11-21: 2.5 [IU]/h via INTRAVENOUS
  Filled 2023-11-21: qty 500

## 2023-11-21 MED ORDER — LIDOCAINE HCL (PF) 2 % IJ SOLN
INTRAMUSCULAR | Status: AC
  Filled 2023-11-21: qty 5

## 2023-11-21 MED ORDER — OXYCODONE HCL 5 MG PO TABS
5.0000 mg | ORAL_TABLET | Freq: Four times a day (QID) | ORAL | Status: DC | PRN
  Administered 2023-11-22: 5 mg via ORAL
  Filled 2023-11-21: qty 1

## 2023-11-21 MED ORDER — DIBUCAINE (PERIANAL) 1 % EX OINT
1.0000 | TOPICAL_OINTMENT | CUTANEOUS | Status: DC | PRN
Start: 2023-11-21 — End: 2023-11-23

## 2023-11-21 MED ORDER — DIPHENHYDRAMINE HCL 25 MG PO CAPS: 25.0000 mg | ORAL_CAPSULE | Freq: Four times a day (QID) | ORAL | Status: DC | PRN

## 2023-11-21 MED ORDER — GABAPENTIN 300 MG PO CAPS
300.0000 mg | ORAL_CAPSULE | Freq: Every day | ORAL | Status: DC
  Administered 2023-11-21 – 2023-11-22 (×3): 300 mg via ORAL
  Filled 2023-11-21 (×3): qty 1

## 2023-11-21 MED ORDER — MENTHOL 3 MG MT LOZG
1.0000 | LOZENGE | OROMUCOSAL | Status: DC | PRN
Start: 2023-11-21 — End: 2023-11-23

## 2023-11-21 MED ORDER — DEXAMETHASONE SODIUM PHOSPHATE 10 MG/ML IJ SOLN
INTRAMUSCULAR | Status: AC
  Filled 2023-11-21: qty 1

## 2023-11-21 MED ORDER — DEXAMETHASONE SODIUM PHOSPHATE 10 MG/ML IJ SOLN
INTRAMUSCULAR | Status: DC | PRN
  Administered 2023-11-21: 10 mg via INTRAVENOUS

## 2023-11-21 MED ORDER — DIPHENHYDRAMINE HCL 25 MG PO CAPS
25.0000 mg | ORAL_CAPSULE | ORAL | Status: DC | PRN
Start: 2023-11-21 — End: 2023-11-21

## 2023-11-21 MED ORDER — LACTATED RINGERS IV SOLN
INTRAVENOUS | Status: DC | PRN
Start: 2023-11-21 — End: 2023-11-21

## 2023-11-21 MED ORDER — SIMETHICONE 80 MG PO CHEW: 80.0000 mg | CHEWABLE_TABLET | ORAL | Status: DC | PRN

## 2023-11-21 MED ORDER — BUPIVACAINE IN DEXTROSE 0.75-8.25 % IT SOLN
INTRATHECAL | Status: DC | PRN
  Administered 2023-11-21: 2 mL via INTRATHECAL

## 2023-11-21 MED ORDER — ACETAMINOPHEN 500 MG PO TABS
1000.0000 mg | ORAL_TABLET | Freq: Four times a day (QID) | ORAL | Status: DC
  Administered 2023-11-21 – 2023-11-23 (×10): 1000 mg via ORAL
  Filled 2023-11-21 (×10): qty 2

## 2023-11-21 MED ORDER — FLEET ENEMA RE ENEM: 1.0000 | ENEMA | Freq: Every day | RECTAL | Status: DC | PRN

## 2023-11-21 MED ORDER — CEPHALEXIN 500 MG PO CAPS
500.0000 mg | ORAL_CAPSULE | Freq: Two times a day (BID) | ORAL | Status: DC
  Administered 2023-11-21 – 2023-11-23 (×4): 500 mg via ORAL
  Filled 2023-11-21 (×6): qty 1

## 2023-11-21 MED ORDER — DROPERIDOL 2.5 MG/ML IJ SOLN: 0.6250 mg | Freq: Once | INTRAMUSCULAR | Status: DC | PRN

## 2023-11-21 MED ORDER — MEPERIDINE HCL 25 MG/ML IJ SOLN: 6.2500 mg | INTRAMUSCULAR | Status: DC | PRN

## 2023-11-21 MED ORDER — OXYCODONE HCL 5 MG PO TABS
ORAL_TABLET | ORAL | Status: AC
  Filled 2023-11-21: qty 1

## 2023-11-21 MED ORDER — SUCCINYLCHOLINE CHLORIDE 200 MG/10ML IV SOSY
PREFILLED_SYRINGE | INTRAVENOUS | Status: DC | PRN
  Administered 2023-11-21: 100 mg via INTRAVENOUS

## 2023-11-21 MED ORDER — CEFAZOLIN SODIUM-DEXTROSE 2-4 GM/100ML-% IV SOLN: 2.0000 g | INTRAVENOUS | Status: DC

## 2023-11-21 MED ORDER — COCONUT OIL OIL
1.0000 | TOPICAL_OIL | Status: DC | PRN
Start: 2023-11-21 — End: 2023-11-23
  Filled 2023-11-21: qty 15

## 2023-11-21 MED ORDER — SENNOSIDES-DOCUSATE SODIUM 8.6-50 MG PO TABS
2.0000 | ORAL_TABLET | ORAL | Status: DC
  Administered 2023-11-21 – 2023-11-22 (×2): 2 via ORAL
  Filled 2023-11-21 (×2): qty 2

## 2023-11-21 MED ORDER — LACTATED RINGERS IV SOLN: INTRAVENOUS | Status: DC

## 2023-11-21 MED ORDER — MIDAZOLAM HCL 2 MG/2ML IJ SOLN
INTRAMUSCULAR | Status: AC
  Filled 2023-11-21: qty 2

## 2023-11-21 MED ORDER — LURASIDONE HCL 20 MG PO TABS
20.0000 mg | ORAL_TABLET | Freq: Every day | ORAL | Status: DC
  Administered 2023-11-22 – 2023-11-23 (×2): 20 mg via ORAL
  Filled 2023-11-21 (×3): qty 1

## 2023-11-21 MED ORDER — PROPOFOL 10 MG/ML IV BOLUS
INTRAVENOUS | Status: AC
  Filled 2023-11-21: qty 20

## 2023-11-21 MED ORDER — OXYCODONE HCL 5 MG PO TABS
5.0000 mg | ORAL_TABLET | Freq: Once | ORAL | Status: AC | PRN
  Administered 2023-11-21: 5 mg via ORAL

## 2023-11-21 MED ORDER — KETOROLAC TROMETHAMINE 30 MG/ML IJ SOLN
30.0000 mg | Freq: Four times a day (QID) | INTRAMUSCULAR | Status: AC
  Administered 2023-11-21 (×3): 30 mg via INTRAVENOUS
  Filled 2023-11-21 (×3): qty 1

## 2023-11-21 MED ORDER — OXYCODONE HCL 5 MG PO TABS
5.0000 mg | ORAL_TABLET | ORAL | Status: DC | PRN
  Administered 2023-11-22 (×2): 10 mg via ORAL
  Administered 2023-11-22: 5 mg via ORAL
  Administered 2023-11-22: 10 mg via ORAL
  Administered 2023-11-23: 5 mg via ORAL
  Administered 2023-11-23 (×2): 10 mg via ORAL
  Filled 2023-11-21 (×2): qty 2
  Filled 2023-11-21: qty 1
  Filled 2023-11-21 (×4): qty 2

## 2023-11-21 MED ORDER — KETOROLAC TROMETHAMINE 30 MG/ML IJ SOLN
30.0000 mg | Freq: Four times a day (QID) | INTRAMUSCULAR | Status: AC | PRN
Start: 2023-11-21 — End: 2023-11-22

## 2023-11-21 MED ORDER — ONDANSETRON HCL 4 MG/2ML IJ SOLN: 4.0000 mg | Freq: Three times a day (TID) | INTRAMUSCULAR | Status: DC | PRN

## 2023-11-21 MED ORDER — ROCURONIUM BROMIDE 10 MG/ML (PF) SYRINGE
PREFILLED_SYRINGE | INTRAVENOUS | Status: AC
Start: 2023-11-21 — End: 2023-11-21
  Filled 2023-11-21: qty 10

## 2023-11-21 MED ORDER — ONDANSETRON HCL 4 MG/2ML IJ SOLN
INTRAMUSCULAR | Status: AC
  Filled 2023-11-21: qty 2

## 2023-11-21 MED ORDER — KETOROLAC TROMETHAMINE 30 MG/ML IJ SOLN
INTRAMUSCULAR | Status: AC
  Filled 2023-11-21: qty 1

## 2023-11-21 MED ORDER — MEASLES, MUMPS & RUBELLA VAC IJ SOLR: 0.5000 mL | INTRAMUSCULAR | Status: DC | PRN

## 2023-11-21 MED ORDER — PROPOFOL 10 MG/ML IV BOLUS
INTRAVENOUS | Status: DC | PRN
  Administered 2023-11-21: 200 mg via INTRAVENOUS

## 2023-11-21 MED ORDER — ACETAMINOPHEN 10 MG/ML IV SOLN: 1000.0000 mg | Freq: Once | INTRAVENOUS | Status: DC | PRN

## 2023-11-21 MED ORDER — OXYCODONE HCL 5 MG/5ML PO SOLN: 5.0000 mg | Freq: Once | ORAL | Status: AC | PRN

## 2023-11-21 MED ORDER — NALOXONE HCL 4 MG/10ML IJ SOLN: 1.0000 ug/kg/h | INTRAVENOUS | Status: DC | PRN

## 2023-11-21 MED ORDER — SIMETHICONE 80 MG PO CHEW
80.0000 mg | CHEWABLE_TABLET | Freq: Three times a day (TID) | ORAL | Status: DC
  Administered 2023-11-21 – 2023-11-23 (×9): 80 mg via ORAL
  Filled 2023-11-21 (×9): qty 1

## 2023-11-21 MED ORDER — SCOPOLAMINE 1 MG/3DAYS TD PT72
1.0000 | MEDICATED_PATCH | Freq: Once | TRANSDERMAL | Status: DC
  Administered 2023-11-21: 1.5 mg via TRANSDERMAL
  Filled 2023-11-21: qty 1

## 2023-11-21 MED ORDER — FENTANYL CITRATE (PF) 100 MCG/2ML IJ SOLN: 25.0000 ug | INTRAMUSCULAR | Status: DC | PRN

## 2023-11-21 MED ORDER — KETOROLAC TROMETHAMINE 30 MG/ML IJ SOLN
30.0000 mg | Freq: Four times a day (QID) | INTRAMUSCULAR | Status: AC | PRN
  Administered 2023-11-21: 30 mg via INTRAMUSCULAR

## 2023-11-21 MED ORDER — FERROUS SULFATE 325 (65 FE) MG PO TABS
325.0000 mg | ORAL_TABLET | Freq: Two times a day (BID) | ORAL | Status: DC
  Administered 2023-11-21 – 2023-11-23 (×5): 325 mg via ORAL
  Filled 2023-11-21 (×5): qty 1

## 2023-11-21 MED ORDER — IBUPROFEN 600 MG PO TABS
600.0000 mg | ORAL_TABLET | Freq: Four times a day (QID) | ORAL | Status: DC
  Administered 2023-11-22 – 2023-11-23 (×7): 600 mg via ORAL
  Filled 2023-11-21 (×7): qty 1

## 2023-11-21 MED ORDER — WITCH HAZEL-GLYCERIN EX PADS: 1.0000 | MEDICATED_PAD | CUTANEOUS | Status: DC | PRN

## 2023-11-21 MED ORDER — LITHIUM CARBONATE ER 300 MG PO TBCR
600.0000 mg | EXTENDED_RELEASE_TABLET | Freq: Every day | ORAL | Status: DC
Start: 2023-11-21 — End: 2023-11-23
  Administered 2023-11-21 – 2023-11-23 (×3): 600 mg via ORAL
  Filled 2023-11-21 (×3): qty 2

## 2023-11-21 MED ORDER — SODIUM CHLORIDE 0.9% FLUSH: 3.0000 mL | INTRAVENOUS | Status: DC | PRN

## 2023-11-21 MED ORDER — ONDANSETRON HCL 4 MG/2ML IJ SOLN
INTRAMUSCULAR | Status: DC | PRN
  Administered 2023-11-21: 4 mg via INTRAVENOUS

## 2023-11-21 MED ORDER — BISACODYL 10 MG RE SUPP: 10.0000 mg | Freq: Every day | RECTAL | Status: DC | PRN

## 2023-11-21 MED ORDER — EPHEDRINE SULFATE-NACL 50-0.9 MG/10ML-% IV SOSY
PREFILLED_SYRINGE | INTRAVENOUS | Status: DC | PRN
Start: 2023-11-21 — End: 2023-11-21
  Administered 2023-11-21: 5 mg via INTRAVENOUS
  Administered 2023-11-21: 10 mg via INTRAVENOUS
  Administered 2023-11-21: 5 mg via INTRAVENOUS

## 2023-11-21 MED ORDER — TETANUS-DIPHTH-ACELL PERTUSSIS 5-2.5-18.5 LF-MCG/0.5 IM SUSY: 0.5000 mL | PREFILLED_SYRINGE | Freq: Once | INTRAMUSCULAR | Status: DC

## 2023-11-21 MED ORDER — FENTANYL CITRATE (PF) 100 MCG/2ML IJ SOLN
INTRAMUSCULAR | Status: AC
Start: 2023-11-21 — End: 2023-11-21
  Filled 2023-11-21: qty 2

## 2023-11-21 MED ORDER — ACETAMINOPHEN 500 MG PO TABS: 1000.0000 mg | ORAL_TABLET | Freq: Four times a day (QID) | ORAL | Status: DC

## 2023-11-21 MED ORDER — LIDOCAINE HCL (CARDIAC) PF 100 MG/5ML IV SOSY
PREFILLED_SYRINGE | INTRAVENOUS | Status: DC | PRN
  Administered 2023-11-21: 60 mg via INTRAVENOUS

## 2023-11-21 MED ORDER — PRENATAL MULTIVITAMIN CH
1.0000 | ORAL_TABLET | Freq: Every day | ORAL | Status: DC
  Administered 2023-11-21 – 2023-11-23 (×3): 1 via ORAL
  Filled 2023-11-21 (×3): qty 1

## 2023-11-21 MED ORDER — FENTANYL CITRATE (PF) 100 MCG/2ML IJ SOLN
INTRAMUSCULAR | Status: DC | PRN
  Administered 2023-11-21: 50 ug via INTRAVENOUS

## 2023-11-21 MED ORDER — NALOXONE HCL 0.4 MG/ML IJ SOLN: 0.4000 mg | INTRAMUSCULAR | Status: DC | PRN

## 2023-11-21 MED ORDER — DIPHENHYDRAMINE HCL 50 MG/ML IJ SOLN: 12.5000 mg | INTRAMUSCULAR | Status: DC | PRN

## 2023-11-21 MED ORDER — CEFAZOLIN SODIUM-DEXTROSE 2-3 GM-%(50ML) IV SOLR
INTRAVENOUS | Status: DC | PRN
  Administered 2023-11-21: 2 g via INTRAVENOUS

## 2023-11-21 SURGICAL SUPPLY — 14 items
ADHESIVE MASTISOL STRL (MISCELLANEOUS) IMPLANT
DRAPE LAPAROTOMY 77X122 PED (DRAPES) IMPLANT
DRSG OPSITE POSTOP 4X8 (GAUZE/BANDAGES/DRESSINGS) IMPLANT
ELECTRODE REM PT RTRN 9FT ADLT (ELECTROSURGICAL) IMPLANT
GAUZE SPONGE 4X4 12PLY STRL (GAUZE/BANDAGES/DRESSINGS) IMPLANT
GLOVE BIO SURGEON STRL SZ7 (GLOVE) IMPLANT
GLOVE INDICATOR 7.5 STRL GRN (GLOVE) IMPLANT
GOWN STRL REUS W/ TWL XL LVL3 (GOWN DISPOSABLE) IMPLANT
NS IRRIG 500ML POUR BTL (IV SOLUTION) IMPLANT
PACK BASIN MINOR ARMC (MISCELLANEOUS) IMPLANT
SOLUTION PREP PVP 2OZ (MISCELLANEOUS) IMPLANT
STRIP CLOSURE SKIN 1/2X4 (GAUZE/BANDAGES/DRESSINGS) IMPLANT
SUT MON AB 3-0 SH27 (SUTURE) IMPLANT
SUT VIC AB 0 CT1 36 (SUTURE) IMPLANT

## 2023-11-21 NOTE — Discharge Summary (Signed)
 Postpartum Discharge Summary  Patient Name: Deborah Mckee DOB: 16-Jul-2000 MRN: [id number]  Date of admission: 11/14/2023 Delivery date:11/20/2023 Delivering provider: VERDON KEEN Date of discharge: 11/23/2023  Primary OB: [facility] [place]) OFE:Ejupzwu'd last menstrual period was 03/26/2023 (exact date). EDC Estimated Date of Delivery: 12/31/23 Gestational Age at Delivery: [redacted]w[redacted]d   Admitting diagnosis: Preterm premature rupture of membranes [O42.919] Intrauterine pregnancy: [redacted]w[redacted]d     Secondary diagnosis:   Principal Problem:   S/P cesarean section Active Problems:   Bipolar affective disorder, current episode mixed (HCC)   Preterm premature rupture of membranes   Chlamydia trachomatis infection in mother during third trimester of pregnancy   Rh negative state in antepartum period   Bipolar disorder current episode depressed (HCC)   Cocaine use complicating pregnancy   Marijuana use during pregnancy   Substance abuse affecting pregnancy, antepartum (HCC)   Discharge Diagnosis: Preterm Pregnancy Delivered and pPROM at 109 weeks      Hospital course: Induction of Labor With Cesarean Section   23 y.o. yo G1P0101 at [redacted]w[redacted]d was admitted to the hospital 11/14/2023 for induction of labor. Patient had a labor course significant for pPROM at 34 weeks. The patient went for cesarean section due to fetal intolerance of labor. Delivery details are as follows: Membrane Rupture Time/Date: 10:00 AM,11/14/2023  Delivery Method:C-Section, Low Transverse Operative Delivery:N/A Details of operation can be found in separate operative Note.  Patient had a postpartum course complicated by returning to the OR for incision repair and being IVC'd by psychiatry. She is ambulating, tolerating a regular diet, passing flatus, and urinating well.  Patient is discharged to psychiatry in stable condition on 11/23/23.      Newborn Data: Birth date:11/20/2023 Birth time:11:28  PM Gender:Female Living status:Living Apgars: 8, 9 Weight:1830 g                                                                         Post partum procedures:returned to the OR at POD 1 for failure of ensorb absorbable sutures with skin opening Induction:: Pitocin  and Cytotec  Complications: None Delivery Type: primary cesarean section, low transverse incision Anesthesia: spinal anesthesia Placenta: manual removal To Pathology: No   Prenatal Labs:  Blood type/Rh O negative  Antibody screen neg  Rubella Immune  Varicella Immune  RPR NR  HBsAg Neg  HIV NR  GC neg  Chlamydia Positive 11/05/23  Genetic screening    1 hour GTT 58  3 hour GTT    GBS Negative by PCR 11/20/23    Magnesium  Sulfate received: No BMZ received: Yes Rhophylac :was given MMR: was not indicated Varivax vaccine given: was not indicated T-DaP:Given prenatally Flu: out of season  Transfusion:No  Physical exam  Vitals:   11/22/23 1532 11/22/23 2312 11/23/23 0831 11/23/23 0831  BP: (!) 122/93 (!) 93/50 118/74 118/74  Pulse: 65 69 66 66  Resp: 18 18 17 16   Temp: 98.7 F (37.1 C) 98.1 F (36.7 C) 98.1 F (36.7 C) 98.1 F (36.7 C)  TempSrc: Oral Oral Oral Oral  SpO2: 100% 100% 100%   Weight:      Height:       General: alert, cooperative, and no distress Lochia: appropriate Uterine Fundus: firm Incision: Healing well with no  significant drainage, No significant erythema, Dressing is clean, dry, and intact, covered with occlusive OP site dressing   DVT Evaluation: No evidence of DVT seen on physical exam.  Labs: Lab Results  Component Value Date   WBC 15.9 (H) 11/22/2023   HGB 11.0 (L) 11/22/2023   HCT 32.9 (L) 11/22/2023   MCV 89.9 11/22/2023   PLT 155 11/22/2023      Latest Ref Rng & Units 09/12/2022    6:04 PM  CMP  Glucose 70 - 99 mg/dL 894   BUN 6 - 20 mg/dL 11   Creatinine 9.55 - 1.00 mg/dL 9.09   Sodium 864 - 854 mmol/L 138   Potassium 3.5 - 5.1 mmol/L 3.2   Chloride 98 - 111  mmol/L 104   CO2 22 - 32 mmol/L 25   Calcium  8.9 - 10.3 mg/dL 8.7   Total Protein 6.5 - 8.1 g/dL 6.9   Total Bilirubin 0.3 - 1.2 mg/dL 0.8   Alkaline Phos 38 - 126 U/L 65   AST 15 - 41 U/L 16   ALT 0 - 44 U/L 9    Edinburgh Score:    11/21/2023   10:38 AM  Edinburgh Postnatal Depression Scale Screening Tool  I have been able to laugh and see the funny side of things. 0  I have looked forward with enjoyment to things. 0  I have blamed myself unnecessarily when things went wrong. 2  I have been anxious or worried for no good reason. 3  I have felt scared or panicky for no good reason. 2  Things have been getting on top of me. 2  I have been so unhappy that I have had difficulty sleeping. 0  I have felt sad or miserable. 1  I have been so unhappy that I have been crying. 2  The thought of harming myself has occurred to me. 0  Edinburgh Postnatal Depression Scale Total 12    Risk assessment for postpartum VTE and prophylactic treatment: Very high risk factors: None High risk factors: Unscheduled cesarean after labor  Moderate risk factors: Cesarean delivery   Postpartum VTE prophylaxis with LMWH not indicated  After visit meds:  Allergies as of 11/23/2023       Reactions   Penicillins Rash, Other (See Comments)        Medication List     STOP taking these medications    ARIPiprazole  20 MG tablet Commonly known as: ABILIFY    diazepam 5 MG tablet Commonly known as: VALIUM   fluconazole  150 MG tablet Commonly known as: DIFLUCAN    lisdexamfetamine 40 MG capsule Commonly known as: VYVANSE   metroNIDAZOLE  500 MG tablet Commonly known as: FLAGYL    nicotine  21 mg/24hr patch Commonly known as: NICODERM CQ  - dosed in mg/24 hours   trazodone  300 MG tablet Commonly known as: DESYREL        TAKE these medications    acetaminophen  500 MG tablet Commonly known as: TYLENOL  Take 2 tablets (1,000 mg total) by mouth every 6 (six) hours as needed for mild pain (pain  score 1-3) or fever.   cephALEXin  500 MG capsule Commonly known as: KEFLEX  Take 1 capsule (500 mg total) by mouth every 12 (twelve) hours for 3 days.   ferrous sulfate  325 (65 FE) MG tablet Take 1 tablet (325 mg total) by mouth 2 (two) times daily with a meal.   ibuprofen  600 MG tablet Commonly known as: ADVIL  Take 1 tablet (600 mg total) by mouth every 6 (six) hours  as needed for fever, mild pain (pain score 1-3) or cramping.   lithium  carbonate 300 MG ER tablet Commonly known as: LITHOBID  300 mg at bedtime. TAKE 1 TABLET BY MOUTH NIGHTLY (ALONG WITH 450 MG What changed: Another medication with the same name was removed. Continue taking this medication, and follow the directions you see here.   lurasidone  20 MG Tabs tablet Commonly known as: LATUDA  Take 1 tablet (20 mg total) by mouth daily with supper.   oxyCODONE  5 MG immediate release tablet Commonly known as: Oxy IR/ROXICODONE  Take 1-2 tablets (5-10 mg total) by mouth every 4 (four) hours as needed for up to 7 days for moderate pain (pain score 4-6) or severe pain (pain score 7-10).   senna-docusate 8.6-50 MG tablet Commonly known as: Senokot-S Take 2 tablets by mouth at bedtime as needed for mild constipation.   simethicone  80 MG chewable tablet Commonly known as: MYLICON Chew 1 tablet (80 mg total) by mouth 3 (three) times daily after meals.       Discharge home in stable condition Infant Feeding: Bottle and pumping Infant Disposition:home with mother Discharge instruction: per After Visit Summary and Postpartum booklet. Activity: Advance as tolerated. Pelvic rest for 6 weeks.  Diet: routine diet Anticipated Birth Control: Unsure Postpartum Appointment:6 weeks Additional Postpartum F/U: Incision check 2 weeks Future Appointments:No future appointments. Follow up Visit:  Follow-up Information     [facility], [facility]. Schedule an appointment as soon as possible for a visit.   Why: To follow up for  psychotherapy Contact information: [address] [place] [address] [phone]         Severa Clarity, PA-C. Schedule an appointment as soon as possible for a visit today.   Specialty: Physician Assistant Why: This is your established outpatient psychiatric provider please make an appointment to follow up with this provider for medication management within one week of discharge. Contact information: [address] [place] [address] [phone]         Verdon Keen, MD Follow up in 2 week(s).   Specialty: Obstetrics and Gynecology Why: For postop check Contact information: [address] [place] [address] [phone]                 Plan:  JOSY PEADEN was discharged to inpatient psychiatry in good condition. Follow-up appointment as directed.    Signed:  Edsel Charlies Blush, CNM 11/23/2023 11:53 AM

## 2023-11-21 NOTE — Progress Notes (Signed)
 Post Partum Day 1  Subjective: Doing well, no concerns. Ambulating without difficulty, pain managed with PO meds, tolerating regular diet, and voiding without difficulty.   No fever/chills, chest pain, shortness of breath, nausea/vomiting, or leg pain. No nipple or breast pain. No headache, visual changes, or RUQ/epigastric pain.  Objective: BP (!) 96/58 (BP Location: Right Arm)   Pulse 64   Temp 98.3 F (36.8 C) (Oral)   Resp 18   Ht 5' 6 (1.676 m)   Wt 61.2 kg   LMP 03/26/2023 (Exact Date)   SpO2 99%   BMI 21.79 kg/m    Physical Exam:  General: alert and cooperative Breasts: soft/nontender CV: RRR Pulm: nl effort Abdomen: soft, non-tender Uterine Fundus: firm Incision: healing well Perineum: intact Lochia: appropriate DVT Evaluation: No evidence of DVT seen on physical exam. Edinburgh:     11/21/2023    7:45 AM  [place] Postnatal Depression Scale Screening Tool  I have been able to laugh and see the funny side of things. --     Recent Labs    11/19/23 0925 11/21/23 0813  HGB 14.6 12.1  HCT 43.2 36.9  WBC 11.5* 17.1*  PLT 185 145*    Assessment/Plan: 22 y.o. G1P0000 postpartum day # 1  1. Continue routine postpartum care  2. Infant feeding status: expressed breast milk -Lactation consult PRN for breastfeeding   3. Contraception plan: TBD  4. Acute blood loss anemia - clinically not significant .  -Hemodynamically stable and asymptomatic -Intervention: continue on oral supplementation with ferrous sulfate  325  5. Immunization status:   all immunizations up to date  6. Delivery at 34 weeks - Baby in [facility], patient visiting often  7. Bipolar affective disorder - Managed by Behavioral Health  8. Substance use in pregnancy - Followed by [facility]   Disposition: Continue inpatient postpartum care    LOS: 7 days   Deborah Mckee, CNM 11/21/2023, 10:08 AM

## 2023-11-21 NOTE — Progress Notes (Signed)
 To patient's bedside to assess incision. Full length open without evidence of infection, but I am unable to see the Ensorb staples either.  Will take to the OR for incision revision with prolene and antibiotics - ancef  tolerated and I'll send her home with Keflex .

## 2023-11-21 NOTE — Anesthesia Preprocedure Evaluation (Addendum)
 Anesthesia Evaluation  Patient identified by MRN, date of birth, ID band Patient awake    Reviewed: Allergy & Precautions, H&P , NPO status , Patient's Chart, lab work & pertinent test results  Airway Mallampati: II  TM Distance: >3 FB Neck ROM: full    Dental no notable dental hx.    Pulmonary Current Smoker   Pulmonary exam normal        Cardiovascular Exercise Tolerance: Good negative cardio ROS Normal cardiovascular exam     Neuro/Psych  PSYCHIATRIC DISORDERS      1. Bipolar disorder 2. Borderline Personality disorder 3. PTSD    GI/Hepatic negative GI ROS,,,(+)     substance abuse  cocaine use and marijuana use  Endo/Other    Renal/GU   negative genitourinary   Musculoskeletal   Abdominal  (+)  Abdomen: soft.   Peds  Hematology negative hematology ROS (+)   Anesthesia Other Findings Pt with + UDS on admission for cocaine, THC, benzos. Pt reports the cocaine was a one time use. She was assessed for suicidal ideation and was taken off precautions this admission.  She had a CD 8/3 via epidural with no anesthesia complications. She has a wound dehiscence.   Past Medical History: No date: Anxiety No date: Chlamydia No date: Depression No date: History of cocaine abuse (HCC) No date: Vaccine for human papilloma virus (HPV) types 6, 11, 16, and  18 administered No date: Vision abnormalities     Comment:  wears glasses  Past Surgical History: 04/22/2017: INGUINAL HERNIA PEDIATRIC WITH LAPAROSCOPIC EXAM; Left     Comment:  Procedure: INGUINAL HERNIA PEDIATRIC WITH LAP LOOK OF               PELVIC FLOOR;  Surgeon: Claudius Kaplan, MD;  Location:               [facility];  Service: Pediatrics;                Laterality: Left; 2008: INGUINAL HERNIA REPAIR; Right  BMI    Body Mass Index: 21.79 kg/m      Reproductive/Obstetrics (+) Pregnancy                               Anesthesia Physical Anesthesia Plan  ASA: 2  Anesthesia Plan:    Post-op Pain Management: Tylenol  PO (pre-op)*   Induction: Intravenous and Rapid sequence  PONV Risk Score and Plan:   Airway Management Planned: Oral ETT  Additional Equipment:   Intra-op Plan:   Post-operative Plan: Extubation in OR  Informed Consent: I have reviewed the patients History and Physical, chart, labs and discussed the procedure including the risks, benefits and alternatives for the proposed anesthesia with the patient or authorized representative who has indicated his/her understanding and acceptance.       Plan Discussed with: Anesthesiologist, CRNA and Surgeon  Anesthesia Plan Comments: (Pt had lunch at 2pm. Dr. Verdon directs us  to proceed. Pt with no gastric retention symptoms. )         Anesthesia Quick Evaluation

## 2023-11-21 NOTE — Progress Notes (Signed)
 G1P0000 at [redacted]w[redacted]d who PPROM at [redacted]w[redacted]d with a hx of psych needs, started induction of labor at 34wks with deep fetal decels remote from delivery. An urgent c/s was called and the patient verbally consented with family in the room. Written consents signed after procedure.  She received PNC at outside clinic and has a hx of lithium  use in pregnancy. I can't find a record of fetal cardiac specific imaging. She did have insufficient prenatal care.  The risks of cesarean section discussed with the patient included but were not limited to: bleeding which may require transfusion or reoperation; infection which may require antibiotics; injury to bowel, bladder, ureters or other surrounding organs; injury to the fetus; need for additional procedures including hysterectomy in the event of a life-threatening hemorrhage; placental abnormalities wth subsequent pregnancies, incisional problems, thromboembolic phenomenon and other postoperative/anesthesia complications. The patient concurred with the proposed plan, giving informed written consent for the procedure. Anesthesia and OR aware. Preoperative prophylactic antibiotics and SCDs ordered on call to the OR.  To OR when ready.

## 2023-11-21 NOTE — Anesthesia Postprocedure Evaluation (Signed)
 Anesthesia Post Note  Patient: DESANI SPRUNG  Procedure(s) Performed: CESAREAN DELIVERY  Patient location during evaluation: Mother Baby Anesthesia Type: Epidural Level of consciousness: awake and alert Pain management: pain level controlled Vital Signs Assessment: post-procedure vital signs reviewed and stable Respiratory status: spontaneous breathing, nonlabored ventilation and respiratory function stable Cardiovascular status: blood pressure returned to baseline and stable Postop Assessment: no apparent nausea or vomiting, no backache, no headache, patient able to bend at knees, adequate PO intake and able to ambulate Anesthetic complications: no   No notable events documented.   Last Vitals:  Vitals:   11/21/23 0700 11/21/23 0745  BP:  (!) 96/58  Pulse: (!) 55 64  Resp:  18  Temp:  36.8 C  SpO2: 100% 99%    Last Pain:  Vitals:   11/21/23 0745  TempSrc: Oral  PainSc: 0-No pain                 [name]

## 2023-11-21 NOTE — Progress Notes (Signed)
 Labor Progress Note  Deborah Mckee is a 23 y.o. G1P0000 at [redacted]w[redacted]d by LMP admitted for PPROM  Subjective: called to the room urgently for fetal heart rate decelerations  Objective: BP 108/69   Pulse (!) 54   Temp 98.6 F (37 C) (Oral)   Resp 16   Ht 5' 6 (1.676 m)   Wt 61.2 kg   LMP 03/26/2023 (Exact Date)   SpO2 99%   BMI 21.79 kg/m  Notable VS details: reviewed  Fetal Assessment: FHT:  FHR: indeterminate bpm, variability: minimal ,  accelerations:  Abscent,  decelerations:  Present recurrent variable decelerations to the 60s every 2 minutes Category/reactivity:  Category II UC:   regular, every 1-2 minutes SVE:   patient noted to have bright red vaginal bleeding Dilation: 1cm  Effacement: 80%  Station:  -1  Consistency: medium  Position: middle  Membrane status:PPROM 11/14/23 Amniotic color: clear  Labs: Lab Results  Component Value Date   WBC 11.5 (H) 11/19/2023   HGB 14.6 11/19/2023   HCT 43.2 11/19/2023   MCV 88.9 11/19/2023   PLT 185 11/19/2023    Assessment / Plan: 23 year old G1P0 at [redacted]w[redacted]d with IOL for PPROM  Labor: Pitocin  stopped, fetal intolerance of labor, decision made for urgent cesarean section. Dr. Verdon notified and en route. Anesthesia notified and at bedside. NICU notified.  Preeclampsia:  labs stable Fetal Wellbeing:  Category II, recurrent variable decelerations to the 60s for the duration of the contraction, resolved for 1 minute, then recurrent variable to the 60s. Placed IUPC and started amnioinfusion. Terbutaline  administered. Pitocin  turned off. Attempted position changes. No improvement in variable decelerations. Called Dr. Verdon and requested she come urgently to L&D for an urgent cesarean section. Discussed with the patient that her baby is not tolerating contractions and she is remote from delivery. That combined with the bright red vaginal bleeding that recently started has me concerned about a possible placenta abruption. I recommend  a primary cesarean section for fetal intolerance. Patient is agreeable with a primary cesarean section. Pain Control:  Epidural I/D:  GBS negative by PCR Anticipated MOD:  urgent cesarean section  Edsel Charlies Blush, CNM 11/21/2023, 12:10 AM

## 2023-11-21 NOTE — Op Note (Signed)
 Thersia HERO Roulhac PROCEDURE DATE: 11/21/2023   PREOPERATIVE DIAGNOSIS: Failure of Ensorb absorbable sutures with skin opening POSTOPERATIVE DIAGNOSIS: The same PROCEDURE:  - Exam under anesthesia - Cesarean incision re-closure  SURGEON:  Dr. Heather CHARLENA Penton  ASSISTANT: CST ANESTHESIOLOGIST:  Anesthesiologist: Vicci Camellia Glatter, MD CRNA: Tod Handing, CRNA  INDICATIONS: 23 y.o. G1P001 who underwent cesarean section yesterday was found to have a bleeding from her incision at the bedside on the postpartum unit.  The wound was infused with 1% lidocaine  and opened at the bedside, evacuating the hematoma.  There was evidence of an active small arterial bleed that was unable to be controlled with adequate pain control and she was brought to the operating room for appropriate visualization.  Written informed consent was obtained.     Risks of surgery were discussed with the patient including but not limited to: bleeding which may require transfusion or reoperation; infection which may require antibiotics; injury to bowel, bladder, ureters or other surrounding organs; need for additional procedures including laparotomy; thromboembolic phenomenon, incisional problems and other postoperative/anesthesia complications. Written informed consent was obtained.    FINDINGS:  Entire fascial incision intact.  The underlying running sutures of subcutaneous layer were also intact.  No evidence of infection.  The entire subcuticular staples were released and the skin incision was opened in its entirety.  The wound was otherwise unremarkable.  ANESTHESIA:    General INTRAVENOUS FLUIDS: 200 ml ESTIMATED BLOOD LOSS: 0 ml SPECIMENS: None COMPLICATIONS: None immediate  PROCEDURE IN DETAIL:  The patient was brought to the OR where she had sequential compression devices applied to her lower extremities while in the preoperative area.  IV anesthesia was administered and was found to be adequate.  She was placed in  supine position, and was prepped and draped in a sterile manner.  She was given 2g of Ancef , as this area had been opened previously.     After an adequate timeout was performed, an exam was undertaken that showed the above findings.    The subcutaneous staples were removed sharply, and the wound was irrigated.  The wound was irrigated, and all areas of oozing were controlled with Bovie cautery.  The subcutaneous tissue was closed with 2-0 Vicryl in 2 layers, and the skin was closed with 3-0 Monocryl. The patient tolerated these procedures well.   The patient tolerated the procedures well.  All instruments, needles, and sponge counts were correct x 2. The patient was taken to the recovery room awake, extubated and in stable condition.

## 2023-11-21 NOTE — TOC Progression Note (Signed)
 Transition of Care [name]) - Progression Note    Patient Details  Name: Deborah Mckee MRN: [id number] Date of Birth: [birth year]  [name] CM/SW Contact  Deborah Mckee, [place] Phone Number: 11/21/2023, 5:11 PM  Clinical Narrative:     Chart reviewed. TOC assessment completed with patient on 7/29. Concerns regarding substance abuse and housing discussed. Resources provided to patient during the assessment.   Financial Navigator received referral to discuss options with patient.   TOC will continue to follow patient and discuss additional needs. Additional resources added to the AVS for patient. Per chart review, Patient has familial support.                     Expected Discharge Plan and Services                                               Social Drivers of Health (SDOH) Interventions SDOH Screenings   Food Insecurity: Food Insecurity Present (11/15/2023)  Housing: High Risk (11/15/2023)  Transportation Needs: No Transportation Needs (11/15/2023)  Utilities: Not At Risk (11/15/2023)  Alcohol Screen: Low Risk  (02/10/2021)  Financial Resource Strain: Low Risk  (05/05/2023)   Received from [facility]  Tobacco Use: High Risk (11/14/2023)    Readmission Risk Interventions     No data to display

## 2023-11-21 NOTE — Anesthesia Postprocedure Evaluation (Signed)
 Anesthesia Post Note  Patient: Deborah Mckee  Procedure(s) Performed: Revision of C Section Incision (Abdomen)  Patient location during evaluation: PACU Anesthesia Type: General Level of consciousness: awake and alert Pain management: pain level controlled Vital Signs Assessment: post-procedure vital signs reviewed and stable Respiratory status: spontaneous breathing, nonlabored ventilation and respiratory function stable Cardiovascular status: blood pressure returned to baseline and stable Postop Assessment: no apparent nausea or vomiting Anesthetic complications: no   No notable events documented.   Last Vitals:  Vitals:   11/21/23 1953 11/21/23 2015  BP: 123/71 113/65  Pulse: 60 63  Resp: 13 12  Temp: 36.9 C   SpO2: 100% 100%    Last Pain:  Vitals:   11/21/23 2016  TempSrc:   PainSc: 5                  [name]

## 2023-11-21 NOTE — Progress Notes (Addendum)
 Blood noted on side of panties and with scant blood on pad. RN assessed under pressure bandage to check for bleeding and incision not approximated and open. Wyvonna Coe notified at 1356 and left voicemail for Dr. Verdon to assess patient.   1415: RN and Wyvonna Coe at bedside to re-bandage (steri-strips and honeycomb applied) incision until Dr. Verdon can assess.   1519: Dr. Verdon returned phone call and coming to assess patient shortly.  Pt and mother updated.

## 2023-11-21 NOTE — Anesthesia Procedure Notes (Signed)
 Procedure Name: Intubation Date/Time: 11/21/2023 7:04 PM  Performed by: Tod Handing, CRNAPre-anesthesia Checklist: Patient identified, Patient being monitored, Timeout performed, Emergency Drugs available and Suction available Patient Re-evaluated:Patient Re-evaluated prior to induction Oxygen Delivery Method: Circle system utilized Preoxygenation: Pre-oxygenation with 100% oxygen Induction Type: IV induction, Rapid sequence and Cricoid Pressure applied Ventilation: Mask ventilation without difficulty Laryngoscope Size: Mac, 3 and McGrath Grade View: Grade I Tube type: Oral Tube size: 6.5 mm Number of attempts: 1 Airway Equipment and Method: Stylet Placement Confirmation: ETT inserted through vocal cords under direct vision, positive ETCO2 and breath sounds checked- equal and bilateral Secured at: 21 cm Tube secured with: Tape Dental Injury: Teeth and Oropharynx as per pre-operative assessment

## 2023-11-21 NOTE — Transfer of Care (Signed)
 Immediate Anesthesia Transfer of Care Note  Patient: Deborah Mckee  Procedure(s) Performed: CESAREAN DELIVERY  Patient Location: Mother/Baby  Anesthesia Type:Epidural  Level of Consciousness: awake, alert , and oriented  Airway & Oxygen Therapy: Patient Spontanous Breathing  Post-op Assessment: Report given to RN and Post -op Vital signs reviewed and stable  Post vital signs: Reviewed  Last Vitals:  Vitals Value Taken Time  BP 103/74 11/21/23 00:19  Temp 36.2 C 11/21/23 00:19  Pulse 78 11/21/23 00:19  Resp 15 11/21/23 00:19  SpO2 99 % 11/21/23 00:19    Last Pain:  Vitals:   11/20/23 2238  TempSrc: Oral  PainSc:          Complications: No notable events documented.

## 2023-11-21 NOTE — Op Note (Signed)
  Cesarean Section Procedure Note  Date of procedure: 11/21/2023   Pre-operative Diagnosis: Intrauterine pregnancy at [redacted]w[redacted]d;  - PPROM at [redacted]w[redacted]d - Fetal intolerance to labor- deep extensive decels  Post-operative Diagnosis: same, delivered.  Procedure:  Primary Low Transverse Cesarean Section through Pfannenstiel incision  Surgeon: Heather Penton, MD  Assistant(s):  Edsel Blush, CNM   An experienced assistant was required given the standard of surgical care given the complexity of the case.  This assistant was needed for exposure, dissection, suctioning, retraction, instrument exchange,  CNM assisting with delivery with administration of fundal pressure, and for overall help during the procedure.   Anesthesia: Spinal anesthesia  Anesthesiologist: Vicci Camellia Glatter, MD Anesthesiologist: Vicci Camellia Glatter, MD CRNA: Leontine Katz, CRNA  Estimated Blood Loss:  705         Drains: Foley         Total IV Fluids:  Urine Output:         Specimens: Cord blood, cord gases         Complications:  None; patient tolerated the procedure well.         Disposition: PACU - hemodynamically stable.         Condition: stable  Findings:  A female infant in cephalic presentation. Amniotic fluid - Clear  Birth weight 1380 g.   APGAR (1 MIN):   APGAR (5 MINS):   APGAR (10 MINS):     Intact placenta with a three-vessel cord.  Grossly normal uterus, tubes and ovaries bilaterally.  intraabdominal adhesions were noted. Uterine atony.   Indications: non-reassuring fetal status  Procedure Details  The patient was taken to Operating Room, identified as the correct patient and the procedure verified as C-Section Delivery. A formal Time Out was held with all team members present and in agreement.  After induction of anesthesia, the patient was draped and prepped in the usual sterile manner. A Pfannenstiel skin incision was made and carried down through the subcutaneous  tissue to the fascia. Fascial incision was made and extended transversely with the Mayo scissors. The fascia was separated from the underlying rectus tissue superiorly and inferiorly. The peritoneum was identified and entered bluntly. Peritoneal incision was extended longitudinally. The utero-vesical peritoneal reflection was incised transversely and a bladder flap was created digitally.   A low transverse hysterotomy was made. The fetus was delivered atraumatically. The umbilical cord was clamped x2 and cut and the infant was handed to the awaiting pediatricians. The placenta was removed intact and appeared normal, intact, and with a 3-vessel cord.   The uterus was exteriorized and cleared of all clot and debris. The hysterotomy was closed with running sutures of 0-Vicryl. Uterine atony noted. Bimanual ineffective. 0.2mg  methergine  given directly into uterine muscle. Excellent hemostasis was observed. The peritoneal cavity was cleared of all clots and debris. The uterus was returned to the abdomen.   Gutters and pelvis were evaluated and excellent hemostasis was noted. The fascia was then reapproximated with running sutures of 0 Maxon.  The subcutaneous tissue was reapproximated with running sutures of 0 Vicryl. The skin was reapproximated with Ensorb absorbable staples. 20 of 0.25% bupivicaine was placed in the fascial and skin lines.  Instrument, sponge, and needle counts were correct prior to the abdominal closure and at the conclusion of the case.   The patient tolerated the procedure well and was transferred to the recovery room in stable condition.   Heather Penton, MD 11/21/2023

## 2023-11-21 NOTE — Progress Notes (Addendum)
 Consent for incision revision, CHG wipes, and clean gown in patients room. Pt wants to take a nap now until she is ready to go to the OR. Will pass on to next RN.  1819: all tasks completed. OR transport taking patient now off unit.

## 2023-11-21 NOTE — Transfer of Care (Signed)
 Immediate Anesthesia Transfer of Care Note  Patient: Deborah Mckee  Procedure(s) Performed: Procedure(s): Revision of C Section Incision (N/A)  Patient Location: PACU  Anesthesia Type:General  Level of Consciousness: sedated  Airway & Oxygen Therapy: Patient Spontanous Breathing and Patient connected to face mask oxygen  Post-op Assessment: Report given to RN and Post -op Vital signs reviewed and stable  Post vital signs: Reviewed and stable  Last Vitals:  Vitals:   11/21/23 1531 11/21/23 1948  BP: 108/66 123/71  Pulse: (!) 57   Resp: 19 18  Temp: 36.9 C 36.9 C  SpO2:      Complications: No apparent anesthesia complications

## 2023-11-22 ENCOUNTER — Encounter: Payer: Self-pay | Admitting: Obstetrics and Gynecology

## 2023-11-22 DIAGNOSIS — F1290 Cannabis use, unspecified, uncomplicated: Secondary | ICD-10-CM | POA: Diagnosis present

## 2023-11-22 DIAGNOSIS — F1910 Other psychoactive substance abuse, uncomplicated: Secondary | ICD-10-CM | POA: Diagnosis present

## 2023-11-22 DIAGNOSIS — O99320 Drug use complicating pregnancy, unspecified trimester: Secondary | ICD-10-CM | POA: Diagnosis present

## 2023-11-22 LAB — CBC
HCT: 32.9 % — ABNORMAL LOW (ref 36.0–46.0)
Hemoglobin: 11 g/dL — ABNORMAL LOW (ref 12.0–15.0)
MCH: 30.1 pg (ref 26.0–34.0)
MCHC: 33.4 g/dL (ref 30.0–36.0)
MCV: 89.9 fL (ref 80.0–100.0)
Platelets: 155 K/uL (ref 150–400)
RBC: 3.66 MIL/uL — ABNORMAL LOW (ref 3.87–5.11)
RDW: 13 % (ref 11.5–15.5)
WBC: 15.9 K/uL — ABNORMAL HIGH (ref 4.0–10.5)
nRBC: 0 % (ref 0.0–0.2)

## 2023-11-22 LAB — FETAL SCREEN: Fetal Screen: NEGATIVE

## 2023-11-22 MED ORDER — RHO D IMMUNE GLOBULIN 1500 UNIT/2ML IJ SOSY
300.0000 ug | PREFILLED_SYRINGE | Freq: Once | INTRAMUSCULAR | Status: AC
  Administered 2023-11-22: 300 ug via INTRAVENOUS
  Filled 2023-11-22: qty 2

## 2023-11-22 NOTE — TOC Progression Note (Addendum)
 Transition of Care [facility]) - Progression Note    Patient Details  Name: Deborah Mckee MRN: [id number] Date of Birth: [birth year]  [facility] CM/SW Contact  Marinda Cooks, RN Phone Number: 11/22/2023, 8:44 AM  Clinical Narrative:    This CM completed CPS report to [facility] via email after being alerted of pt and her new born infants (+) UDS. This CM spoke with pt introduced role and confirmed that pt was aware of CPS report being completed. Pt shared she works at [employer] in [place] and is currently on maternity leave. Pt also informed she does not have a active PCP and plans on securing one ASAP. Pt confirmed she receives her Mental Services at [facility] Address: [address] [place], [place], [place] [address] Phone: [phone] and confirmed her medication is managed through this provider, last appt was 3 months ago. Pt further shared she missed her last appt and stopped taking her medication but plans on continuing to take meds as prescribed because she knows she needs them especially after giving birth.    Pt informed this CM she plans on discharging  to her mother's home at dc listed on demographics . Pt also shared she plans on obtaining bassinet and car seat prior to her baby discharging from hospital. Pt shared she needs to establish pediatrician for baby at dc . Pt confirmed having WIC services in place, and planning to sign up for food stamps and housing assistance once discharged . This CM confirmed with pt her desire to breast feed once cleared by medical team due to her (+) UDS and prescribed Lithium  being transmitted through her breast milk. Pt expressed she is very excited to be a first time mom and bonding with her baby. Pt confirmed the father of the baby name is Deborah Mckee and shared he is involved  with baby and she anticipates he will sign the birth certificate. Resources placed on AVS for pt to review and secure if desired. Pt  confirmed DC transportation with mother TOC will cont to follow dc planning /care coordination.   15:20: This CM alerted by Nurse pt tentatively going to be IVC'D in response to a prior Suicide note she wrote for herself and her newborn son in special care nursery. Psych consulted and following pt . TOC will cont to follow dc planning /care coordination.     Expected Discharge Plan and Services  Home with Family       Social Drivers of Health (SDOH) Interventions SDOH Screenings   Food Insecurity: Food Insecurity Present (11/15/2023)  Housing: High Risk (11/15/2023)  Transportation Needs: No Transportation Needs (11/15/2023)  Utilities: Not At Risk (11/15/2023)  Alcohol Screen: Low Risk  (02/10/2021)  Financial Resource Strain: Low Risk  (05/05/2023)   Received from [facility]  Tobacco Use: High Risk (11/14/2023)    Readmission Risk Interventions     No data to display

## 2023-11-22 NOTE — Consult Note (Signed)
 [facility] Consult Follow-up  Patient Name: .Deborah Mckee  MRN: [id number]  DOB: 2001-02-26  Consult Order details:  Orders (From admission, onward)     Start     Ordered   11/15/23 1724  IP CONSULT TO PSYCHIATRY       Comments: Patient's mom found suicide notes in patients car today  Ordering Provider: Aisha Heller, CNM  Provider:  (Not yet assigned)  Question Answer Comment  Location [facility]   Reason for Consult? suicide ideation      11/15/23 1724             Mode of Visit: In person    Psychiatry Consult Evaluation  Service Date: November 22, 2023 LOS:  LOS: 8 days  Chief Complaint Concern for SI  Primary Psychiatric Diagnoses  Bipolar disorder Borderline Personality disorder PTSD Cocaine use disorder Cannabis use disorder   Assessment  Deborah Mckee is a 23 y.o. female admitted: Medically  She is a 23 yo G1P0 female presenting at [redacted]w[redacted]d IUP with concerns for PPROM 2 days ago without preterm labor. Pregnancy has also been complicated by extensive mental health history including bipolar affective disorder, substance abuse (UDS positive for cocaine, THC, benzos), Chlamydia infection (recent treatment, TOC positive on admission). Plan is currently for monitoring, possible induction on/after 8/1 ([redacted]w[redacted]d). Patient reported one time use of cocaine following break up with bf they wrote suicide notes as they feared they were going to lose custody of the child. They denied SI on admission and deny current. They are poorly compliant with current medications will continue lithium , level ordered. They have established outpatient follow up. Suicide precautions started for monitoring.    [facility], she is currently post partum and has delivered baby.  Chart is reviewed. Upon approach, patient is noted to be sitting in bed and agreeable to interview. She is initially calm and cooperative. We reviewed the sequence of  symptoms and stressors leading to this admission. She reports a decompensation in mood, stating, "I felt like I wanted to crawl out of my body and not be here anymore." She acknowledges a recent relapse on substances after maintaining sobriety for a period, expressing fear that this relapse could jeopardize custody of her child which lead to hopelessness and SI. She describes experiencing intense mental pain and suicidal thoughts, with distressing emotions addressed in a written note to her baby. Patient reports that lithium  has been effective for mood stabilization in the past but admits to recent noncompliance with medication. She states she has been living with her mother and plans to return to that home following discharge. She reflects on her symptoms leading to hospitalization, stating, "I used to numb the pain. There was a lot going on. We discussed the open CPS case and plans for follow-up regarding custody. Despite denial of SI is the judgement of the undersigned that significant risk factors outweigh protective factors and pt requires further treatment and monitoring in secure psychiatric setting.   She presents with poor insight, impaired judgment, emotional lability, and continued irritability. Stressors include recent relapse, loss of custody of her newborn child, unresolved relationship distress, and a documented note indicating significant suicidal ideation. Lithium  has historically been effective but she has been noncompliant prior to admission. She lacks full understanding of the need for inpatient care despite meeting criteria for involuntary commitment based on suicidal thoughts, recent decompensation, and high-risk psychosocial context.  Educated patient on purpose of psychiatric admission, including  stabilization, safety, and treatment of mood symptoms. Initiated discussion on medication adherence and CPS involvement. Will coordinate care plan with social work and ensure CPS follow-up.  Lithium  may be restarted if medically cleared. Continued observation under IVC. Monitor for mood lability and suicidality.    Diagnoses:  Active Hospital problems: Principal Problem:   S/P cesarean section Active Problems:   Bipolar affective disorder, current episode mixed (HCC)   Preterm premature rupture of membranes   Chlamydia trachomatis infection in mother during third trimester of pregnancy   Rh negative state in antepartum period   Bipolar disorder current episode depressed (HCC)   Cocaine use complicating pregnancy   Marijuana use during pregnancy   Substance abuse affecting pregnancy, antepartum (HCC)    Plan   ## Psychiatric Medication Recommendations:  Continue Lithium  600 mg daily at this time, pt reports intermittent compliance. Discontinue Abilify  20 mg as pt reports she has not been taking. Will d/c trazodone  as pt has not been taking. Discussed starting Latuda  post delivery.   ## Medical Decision Making Capacity: Not specifically addressed in this encounter  ## Further Work-up:   -- Pertinent labwork reviewed earlier this admission    ## Disposition:-- Pt requires on going evaluation, started on suicide precautions, IVC if attempts to leave  ## Behavioral / Environmental: - Patients with borderline personality traits/disorder often use the language of physical pain to communicate both physical and emotional suffering. It is important to address pain complaints as they arise and attempt to identify an etiology, either organic or psychiatric. In patients with chronic pain, it is important to have a discussion with the patient about expectations about pain control. or Utilize compassion and acknowledge the patient's experiences while setting clear and realistic expectations for care.    ## Safety and Observation Level:  - Based on my clinical evaluation, I estimate the patient to be at Moderate risk of self harm in the current setting. - At this time, we recommend   1:1 Observation. This decision is based on my review of the chart including patient's history and current presentation, interview of the patient, mental status examination, and consideration of suicide risk including evaluating suicidal ideation, plan, intent, suicidal or self-harm behaviors, risk factors, and protective factors. This judgment is based on our ability to directly address suicide risk, implement suicide prevention strategies, and develop a safety plan while the patient is in the clinical setting. Please contact our team if there is a concern that risk level has changed.  CSSR Risk Category:C-SSRS RISK CATEGORY: No Risk  Suicide Risk Assessment: Patient has following modifiable risk factors for suicide: under treated depression , medication noncompliance, and triggering events, which we are addressing by Providing safe environment, restarting medications, and monitoring. Patient has following non-modifiable or demographic risk factors for suicide: history of suicide attempt Patient has the following protective factors against suicide: Access to outpatient mental health care, Supportive family, Supportive friends, and pregnancy  Thank you for this consult request. Recommendations have been communicated to the primary team.  We will continue to follow at this time.   Hoy CHRISTELLA Pinal, NP       History of Present Illness  Relevant Aspects of [facility]   Patient Report:  Consult requested due to undated letters found in the patient's vehicle that referenced suicidal ideation.      Psych ROS:  Depression: improved 8/10 down from 10/10 Anxiety:  anxiety 6/10 down from 10/10 Mania (lifetime and current): few years ago   Collateral information:  7/29She reported that the patient has a known history of substance use. On 7/20, the patient experienced a breakup with the father of her child and was permitted to stay at her mother's residence under the condition that she  abstain from drug use. During this time, the mother observed the patient coming and going from the residence frequently and noted that the patient was approximately two months behind on both car and insurance payments. The mother temporarily left the residence to care for her own mother, returning after the patient called her with concerns about vaginal fluid leakage. On 11/15/23, while attempting to organize the patient's belongings and coordinate her care, the mother discovered letters expressing suicidal ideation. She voiced significant concern that the patient may act on these thoughts, given her history and life stressors. The mother stated that the patient cannot return to her home unless she first engages in substance abuse treatment.    Psychiatric and Social History  Psychiatric History:  Information collected from Exam and patient chart  Prev Dx/Sx: Bipolar, substance abuse Current Psych Provider: Mliss Pars[facility] Home Meds (current): Lithium , Abilify , trazodone  Previous Med Trials: seroquel , trazodone , celexa , valium, ativan , wellbutrin, lexapro, gabapentin , intuniv, lamictal , lurasidone    Prior Psych Hospitalization: yes, May [id number]  Prior Self Harm: Yes Prior Violence: Denies  Family Psych History: Father , borderline disorder, depression; Paternal Grandfather, bipolar, depression Family Hx suicide: Father  Social History:   Educational Hx: some college Occupational Hx: Work at [employer] Hx: Denies Living Situation: previously with boyfriend but now with mother. Spiritual Hx: Christian Access to weapons/lethal means: Denies   Substance History Alcohol: Denies   Last Drink before pregnancy  History of alcohol withdrawal seizures Denies History of DT's Denies Tobacco: Vapes Illicit drugs: Cannabis, cocaine  Rehab hx: yes, unsure  Exam Findings  Physical Exam: AAO, no acute distress, respirations unlabored Vital Signs:   Temp:  [98.1 F (36.7 C)-98.7 F (37.1 C)] 98.7 F (37.1 C) (08/04 1532) Pulse Rate:  [52-70] 65 (08/04 1532) Resp:  [12-20] 18 (08/04 1532) BP: (100-123)/(57-93) 122/93 (08/04 1532) SpO2:  [94 %-100 %] 100 % (08/04 1532) Blood pressure (!) 122/93, pulse 65, temperature 98.7 F (37.1 C), temperature source Oral, resp. rate 18, height 5' 6 (1.676 m), weight 61.2 kg, last menstrual period 03/26/2023, SpO2 100%. Body mass index is 21.79 kg/m.    Appearance: Mildly disheveled, dressed appropriately Eye Contact: Fair Speech: Pressured at times, normal tone Mood: Labile, anxious, dysphoric Affect: Reactive, congruent Thought Process: Circumstantial, becomes disorganized under stress Thought Content: Expresses feelings of being punished, poor insight into admission necessity Perception: Denies hallucinations currently Suicidal Ideation: Historical, passive; denies current intent or plan Homicidal Ideation: Denied Orientation: Alert and oriented 3 Attention: Variable Memory: Intact Concentration: Fluctuates with affect Insight: Poor Judgment: Impaired   Other History   These have been pulled in through the EMR, reviewed, and updated if appropriate.  Family History:  The patient's family history is not on file.  Medical History: Past Medical History:  Diagnosis Date   Anxiety    Chlamydia    Depression    History of cocaine abuse (HCC)    Vaccine for human papilloma virus (HPV) types 6, 11, 16, and 18 administered    Vision abnormalities    wears glasses    Surgical History: Past Surgical History:  Procedure Laterality Date   INGUINAL HERNIA PEDIATRIC WITH LAPAROSCOPIC EXAM Left 04/22/2017   Procedure: INGUINAL HERNIA PEDIATRIC WITH LAP LOOK OF PELVIC  FLOOR;  Surgeon: Claudius Kaplan, MD;  Location: [facility];  Service: Pediatrics;  Laterality: Left;   INGUINAL HERNIA REPAIR Right 2008   LAPAROTOMY N/A 11/21/2023   Procedure: Revision of C Section  Incision;  Surgeon: Verdon Keen, MD;  Location: [facility];  Service: Gynecology;  Laterality: N/A;     Medications:   Current Facility-Administered Medications:    acetaminophen  (TYLENOL ) tablet 1,000 mg, 1,000 mg, Oral, Q6H, Verdon Keen, MD, 1,000 mg at 11/22/23 1246   bisacodyl  (DULCOLAX) suppository 10 mg, 10 mg, Rectal, Daily PRN, Verdon Keen, MD   cephALEXin  (KEFLEX ) capsule 500 mg, 500 mg, Oral, Q12H, Verdon Keen, MD, 500 mg at 11/22/23 1033   coconut oil, 1 Application, Topical, PRN, Verdon Keen, MD   witch hazel-glycerin  (TUCKS) pad 1 Application, 1 Application, Topical, PRN **AND** dibucaine (NUPERCAINAL) 1 % rectal ointment 1 Application, 1 Application, Rectal, PRN, Verdon Keen, MD   diphenhydrAMINE  (BENADRYL ) capsule 25 mg, 25 mg, Oral, Q6H PRN, Verdon Keen, MD   ferrous sulfate  tablet 325 mg, 325 mg, Oral, BID WC, Verdon Keen, MD, 325 mg at 11/22/23 9164   gabapentin  (NEURONTIN ) capsule 300 mg, 300 mg, Oral, QHS, Verdon Keen, MD, 300 mg at 11/21/23 2146   [EXPIRED] ketorolac  (TORADOL ) 30 MG/ML injection 30 mg, 30 mg, Intravenous, Q6H, 30 mg at 11/21/23 1511 **FOLLOWED BY** ibuprofen  (ADVIL ) tablet 600 mg, 600 mg, Oral, Q6H, Verdon Keen, MD, 600 mg at 11/22/23 1440   lactated ringers  infusion, , Intravenous, Continuous, Verdon Keen, MD, Last Rate: 125 mL/hr at 11/21/23 2051, New Bag at 11/21/23 2051   lithium  carbonate (LITHOBID ) ER tablet 600 mg, 600 mg, Oral, Daily, Verdon Keen, MD, 600 mg at 11/22/23 1033   lurasidone  (LATUDA ) tablet 20 mg, 20 mg, Oral, Q supper, Verdon Keen, MD   measles, mumps & rubella vaccine (MMR) injection 0.5 mL, 0.5 mL, Subcutaneous, Prior to discharge, Verdon Keen, MD   menthol -cetylpyridinium (CEPACOL) lozenge 3 mg, 1 lozenge, Oral, Q2H PRN, Verdon Keen, MD   naloxone  (NARCAN ) injection 0.4 mg, 0.4 mg, Intravenous, PRN **AND** sodium chloride  flush (NS) 0.9 % injection 3 mL, 3 mL,  Intravenous, PRN, Vicci Camellia Glatter, MD   naloxone  HCl (NARCAN ) 2 mg in dextrose  5 % 250 mL infusion, 1-2 mcg/kg/hr, Intravenous, Continuous PRN, Vicci Camellia Glatter, MD   ondansetron  (ZOFRAN ) injection 4 mg, 4 mg, Intravenous, Q8H PRN, Vicci Camellia Glatter, MD   oxyCODONE  (Oxy IR/ROXICODONE ) immediate release tablet 5 mg, 5 mg, Oral, Q6H PRN, Vicci Camellia Glatter, MD, 5 mg at 11/22/23 1014   oxyCODONE  (Oxy IR/ROXICODONE ) immediate release tablet 5-10 mg, 5-10 mg, Oral, Q4H PRN, Verdon Keen, MD, 10 mg at 11/22/23 1439   prenatal multivitamin tablet 1 tablet, 1 tablet, Oral, Q1200, Verdon Keen, MD, 1 tablet at 11/22/23 1246   scopolamine  (TRANSDERM-SCOP) 1 MG/3DAYS 1.5 mg, 1 patch, Transdermal, Once, Vicci Camellia Glatter, MD, 1.5 mg at 11/21/23 0056   senna-docusate (Senokot-S) tablet 2 tablet, 2 tablet, Oral, Q24H, Verdon Keen, MD, 2 tablet at 11/21/23 2146   simethicone  (MYLICON) chewable tablet 80 mg, 80 mg, Oral, TID PC, Verdon Keen, MD, 80 mg at 11/22/23 1246   simethicone  (MYLICON) chewable tablet 80 mg, 80 mg, Oral, PRN, Verdon Keen, MD   sodium phosphate  (FLEET) enema 1 enema, 1 enema, Rectal, Daily PRN, Verdon Keen, MD   Tdap (BOOSTRIX) injection 0.5 mL, 0.5 mL, Intramuscular, Once, Verdon Keen, MD  Allergies: Allergies  Allergen Reactions   Penicillins Rash and Other (See Comments)  Hoy CHRISTELLA Pinal, NP

## 2023-11-22 NOTE — Lactation Note (Signed)
 This note was copied from a baby's chart. Lactation Consultation Note  Patient Name: Deborah Mckee Today's Date: 11/22/2023 Age:23 hours     Maternal Data Lactation to room to speak with mom about pumping.  She stated she didn't want to talk because she was to upset and would prefer to wait until after she is discharged.     Kainalu Heggs S Loring Liskey 11/22/2023, 5:27 PM

## 2023-11-22 NOTE — Progress Notes (Addendum)
 Postop Day  2  Subjective: 23 y.o. G1P0000 postpartum day #2 status post primary cesarean section d/t NRFHR and s/p incision re-closure d/t failure of Ensorb absorbable sutures. She is ambulating, is tolerating po, is voiding spontaneously.  Her pain is well controlled on PO pain medications. Her lochia is less than menses.  Objective: BP (!) 101/57 (BP Location: Left Arm)   Pulse 61   Temp 98.1 F (36.7 C) (Oral)   Resp 18   Ht 5' 6 (1.676 m)   Wt 61.2 kg   LMP 03/26/2023 (Exact Date)   SpO2 100%   BMI 21.79 kg/m    Physical Exam:  General: alert, cooperative, and no distress Breasts: soft/nontender Pulm: nl effort Abdomen: soft, non-tender, active bowel sounds Uterine Fundus: firm Incision: healing well, no significant drainage, intact, covered with occlusive OP site Perineum: minimal edema, intact Lochia: appropriate DVT Evaluation: No evidence of DVT seen on physical exam.  Recent Labs    11/21/23 0813 11/22/23 0932  HGB 12.1 11.0*  HCT 36.9 32.9*  WBC 17.1* 15.9*  PLT 145* 155    Assessment/Plan: 23 y.o. G1P0000 postpartum day # 2  1. Continue routine postpartum care  2. Infant feeding status: Donor milk  -Lactation consult PRN for breastfeeding. We reviewed her medications and substances along with recommendations for breast feeding. At this time she is pumping and dumping. She is aware she will need several UDS negative for cocaine. We also reviewed latuda  and lithium  risks with breastfeeding a preterm infant. LactMed was reviewed.  -Latuda  is minimally excreted into breast milk but has very limited studies available in preterm infants. It is generally considered reasonable with breastfeeding given benefits of breast milk.  -Lithium  has been better studied and is generally not recommended for preterm infants, although it is not an absolute contraindication. Most studies recommend following infant for signs of lithium  toxicity, poor growth, and lethargy. There  are also recommendations to follow BUN, creatinine, and TSH periodically while receiving breast milk with lithium  exposure.  -At this time, Gracen desires to continue to pump and dump. Will plan to discuss medications and possible exposures prior to breastfeeding with neonatology and lactation.   3. Contraception plan: TBD  4. Postpartum CBC reviewed - clinically stable    5. Immunization status:   all immunizations up to date  6. Substance use in pregnancy  -UDS positive for cocaine, THC, and benzodiazapine on admission  -TOC consult placed  -Will plan for follow up UDS and is aware we do not recommend breastfeeding at this time d/t cocaine and medication exposure.   7. Bipolar disorder -Suicide precautions discontinued by psychiatry on 11/20/2023 -Plan was set for psychiatry to follow up with patient postpartum prior to discharge to check medications. Will notify team to assure they are aware she is now postpartum.   Disposition: continue inpatient postpartum care , plan for discharge home tomorrow    LOS: 8 days   Therisa CHRISTELLA Pillow, [place] 11/22/2023, 10:35 AM   ----- Therisa Pillow  Certified Nurse Midwife [facility] [place]

## 2023-11-23 ENCOUNTER — Encounter: Payer: Self-pay | Admitting: Obstetrics and Gynecology

## 2023-11-23 ENCOUNTER — Other Ambulatory Visit: Payer: Self-pay

## 2023-11-23 ENCOUNTER — Encounter: Payer: Self-pay | Admitting: Psychiatry

## 2023-11-23 ENCOUNTER — Inpatient Hospital Stay
Admission: AD | Admit: 2023-11-23 | Discharge: 2023-12-01 | DRG: 776 | Disposition: A | Discharge status: Home / Self Care | Source: Intra-hospital | Attending: Psychiatry | Admitting: Psychiatry

## 2023-11-23 DIAGNOSIS — F319 Bipolar disorder, unspecified: Secondary | ICD-10-CM

## 2023-11-23 DIAGNOSIS — Z880 Allergy status to penicillin: Secondary | ICD-10-CM

## 2023-11-23 DIAGNOSIS — Z79899 Other long term (current) drug therapy: Secondary | ICD-10-CM | POA: Diagnosis not present

## 2023-11-23 DIAGNOSIS — O99335 Smoking (tobacco) complicating the puerperium: Secondary | ICD-10-CM | POA: Diagnosis not present

## 2023-11-23 DIAGNOSIS — F419 Anxiety disorder, unspecified: Secondary | ICD-10-CM | POA: Diagnosis present

## 2023-11-23 DIAGNOSIS — F1410 Cocaine abuse, uncomplicated: Secondary | ICD-10-CM | POA: Diagnosis not present

## 2023-11-23 DIAGNOSIS — F332 Major depressive disorder, recurrent severe without psychotic features: Secondary | ICD-10-CM | POA: Diagnosis not present

## 2023-11-23 DIAGNOSIS — O99325 Drug use complicating the puerperium: Secondary | ICD-10-CM | POA: Diagnosis not present

## 2023-11-23 DIAGNOSIS — Z5941 Food insecurity: Secondary | ICD-10-CM | POA: Diagnosis not present

## 2023-11-23 DIAGNOSIS — O99320 Drug use complicating pregnancy, unspecified trimester: Secondary | ICD-10-CM | POA: Diagnosis present

## 2023-11-23 DIAGNOSIS — R45851 Suicidal ideations: Secondary | ICD-10-CM | POA: Diagnosis not present

## 2023-11-23 DIAGNOSIS — F17290 Nicotine dependence, other tobacco product, uncomplicated: Secondary | ICD-10-CM | POA: Diagnosis not present

## 2023-11-23 DIAGNOSIS — F3160 Bipolar disorder, current episode mixed, unspecified: Secondary | ICD-10-CM | POA: Diagnosis not present

## 2023-11-23 DIAGNOSIS — F603 Borderline personality disorder: Secondary | ICD-10-CM | POA: Diagnosis present

## 2023-11-23 DIAGNOSIS — O99345 Other mental disorders complicating the puerperium: Principal | ICD-10-CM | POA: Diagnosis present

## 2023-11-23 DIAGNOSIS — F1290 Cannabis use, unspecified, uncomplicated: Secondary | ICD-10-CM | POA: Diagnosis not present

## 2023-11-23 DIAGNOSIS — O99323 Drug use complicating pregnancy, third trimester: Secondary | ICD-10-CM | POA: Diagnosis not present

## 2023-11-23 DIAGNOSIS — Z9152 Personal history of nonsuicidal self-harm: Secondary | ICD-10-CM | POA: Diagnosis not present

## 2023-11-23 DIAGNOSIS — Z3A00 Weeks of gestation of pregnancy not specified: Secondary | ICD-10-CM | POA: Diagnosis not present

## 2023-11-23 DIAGNOSIS — Z5181 Encounter for therapeutic drug level monitoring: Secondary | ICD-10-CM | POA: Diagnosis not present

## 2023-11-23 LAB — RHOGAM INJECTION: Unit division: 0

## 2023-11-23 MED ORDER — COCONUT OIL OIL: 1.0000 | TOPICAL_OIL | Status: DC | PRN

## 2023-11-23 MED ORDER — IBUPROFEN 600 MG PO TABS: 600.0000 mg | ORAL_TABLET | Freq: Four times a day (QID) | ORAL | 0 refills | Status: AC | PRN

## 2023-11-23 MED ORDER — IBUPROFEN 600 MG PO TABS
600.0000 mg | ORAL_TABLET | Freq: Four times a day (QID) | ORAL | Status: DC
  Administered 2023-11-24 – 2023-12-01 (×25): 600 mg via ORAL
  Filled 2023-11-23 (×20): qty 1

## 2023-11-23 MED ORDER — HALOPERIDOL LACTATE 5 MG/ML IJ SOLN: 5.0000 mg | Freq: Three times a day (TID) | INTRAMUSCULAR | Status: DC | PRN

## 2023-11-23 MED ORDER — DIPHENHYDRAMINE HCL 50 MG/ML IJ SOLN: 50.0000 mg | Freq: Three times a day (TID) | INTRAMUSCULAR | Status: DC | PRN

## 2023-11-23 MED ORDER — HYDROXYZINE HCL 25 MG PO TABS
25.0000 mg | ORAL_TABLET | Freq: Four times a day (QID) | ORAL | Status: DC | PRN
  Administered 2023-11-25: 25 mg via ORAL
  Filled 2023-11-23: qty 1

## 2023-11-23 MED ORDER — PRENATAL MULTIVITAMIN CH
1.0000 | ORAL_TABLET | Freq: Every day | ORAL | Status: DC
  Administered 2023-11-25 – 2023-12-01 (×10): 1 via ORAL
  Filled 2023-11-23 (×9): qty 1

## 2023-11-23 MED ORDER — DIPHENHYDRAMINE HCL 25 MG PO CAPS: 25.0000 mg | ORAL_CAPSULE | Freq: Four times a day (QID) | ORAL | Status: DC | PRN

## 2023-11-23 MED ORDER — LORAZEPAM 2 MG/ML IJ SOLN
2.0000 mg | Freq: Three times a day (TID) | INTRAMUSCULAR | Status: DC | PRN
Start: 2023-11-23 — End: 2023-12-01

## 2023-11-23 MED ORDER — FERROUS SULFATE 325 (65 FE) MG PO TABS
325.0000 mg | ORAL_TABLET | Freq: Two times a day (BID) | ORAL | Status: DC
  Administered 2023-11-24 – 2023-12-01 (×20): 325 mg via ORAL
  Filled 2023-11-23 (×15): qty 1

## 2023-11-23 MED ORDER — SIMETHICONE 80 MG PO CHEW: 80.0000 mg | CHEWABLE_TABLET | Freq: Three times a day (TID) | ORAL | 0 refills | Status: AC

## 2023-11-23 MED ORDER — DIBUCAINE (PERIANAL) 1 % EX OINT: 1.0000 | TOPICAL_OINTMENT | CUTANEOUS | Status: DC | PRN

## 2023-11-23 MED ORDER — CEPHALEXIN 500 MG PO CAPS: 500.0000 mg | ORAL_CAPSULE | Freq: Two times a day (BID) | ORAL | 0 refills | Status: DC

## 2023-11-23 MED ORDER — GABAPENTIN 300 MG PO CAPS
300.0000 mg | ORAL_CAPSULE | Freq: Every day | ORAL | Status: DC
  Administered 2023-11-23 – 2023-11-30 (×10): 300 mg via ORAL
  Filled 2023-11-23 (×8): qty 1

## 2023-11-23 MED ORDER — OXYCODONE HCL 5 MG PO TABS: 5.0000 mg | ORAL_TABLET | ORAL | 0 refills | Status: DC | PRN

## 2023-11-23 MED ORDER — WITCH HAZEL-GLYCERIN EX PADS: 1.0000 | MEDICATED_PAD | CUTANEOUS | Status: DC | PRN

## 2023-11-23 MED ORDER — OXYCODONE HCL 5 MG PO TABS
5.0000 mg | ORAL_TABLET | Freq: Four times a day (QID) | ORAL | Status: DC | PRN
  Administered 2023-11-23 – 2023-11-25 (×6): 5 mg via ORAL
  Filled 2023-11-23 (×6): qty 1

## 2023-11-23 MED ORDER — LURASIDONE HCL 20 MG PO TABS: 20.0000 mg | ORAL_TABLET | Freq: Every day | ORAL | Status: DC

## 2023-11-23 MED ORDER — ACETAMINOPHEN 500 MG PO TABS
1000.0000 mg | ORAL_TABLET | Freq: Four times a day (QID) | ORAL | Status: DC
  Administered 2023-11-23 – 2023-11-30 (×21): 1000 mg via ORAL
  Filled 2023-11-23 (×18): qty 2

## 2023-11-23 MED ORDER — DIPHENHYDRAMINE HCL 25 MG PO CAPS
50.0000 mg | ORAL_CAPSULE | Freq: Three times a day (TID) | ORAL | Status: DC | PRN
  Administered 2023-11-24: 50 mg via ORAL
  Filled 2023-11-23: qty 2

## 2023-11-23 MED ORDER — HALOPERIDOL LACTATE 5 MG/ML IJ SOLN: 10.0000 mg | Freq: Three times a day (TID) | INTRAMUSCULAR | Status: DC | PRN

## 2023-11-23 MED ORDER — MAGNESIUM HYDROXIDE 400 MG/5ML PO SUSP: 30.0000 mL | Freq: Every day | ORAL | Status: DC | PRN

## 2023-11-23 MED ORDER — LITHIUM CARBONATE ER 300 MG PO TBCR
600.0000 mg | EXTENDED_RELEASE_TABLET | Freq: Every day | ORAL | Status: DC
  Administered 2023-11-24 – 2023-11-27 (×4): 600 mg via ORAL
  Filled 2023-11-23 (×4): qty 2

## 2023-11-23 MED ORDER — SIMETHICONE 80 MG PO CHEW: 80.0000 mg | CHEWABLE_TABLET | ORAL | Status: DC | PRN

## 2023-11-23 MED ORDER — ACETAMINOPHEN 325 MG PO TABS: 650.0000 mg | ORAL_TABLET | Freq: Four times a day (QID) | ORAL | Status: DC | PRN

## 2023-11-23 MED ORDER — CEPHALEXIN 500 MG PO CAPS
500.0000 mg | ORAL_CAPSULE | Freq: Two times a day (BID) | ORAL | Status: AC
  Administered 2023-11-23 – 2023-11-28 (×10): 500 mg via ORAL
  Filled 2023-11-23 (×10): qty 1

## 2023-11-23 MED ORDER — SENNOSIDES-DOCUSATE SODIUM 8.6-50 MG PO TABS
2.0000 | ORAL_TABLET | ORAL | Status: DC
  Administered 2023-11-23 – 2023-11-29 (×8): 2 via ORAL
  Filled 2023-11-23 (×7): qty 2

## 2023-11-23 MED ORDER — ONDANSETRON HCL 4 MG PO TABS: 4.0000 mg | ORAL_TABLET | Freq: Three times a day (TID) | ORAL | Status: DC | PRN

## 2023-11-23 MED ORDER — HALOPERIDOL 5 MG PO TABS
5.0000 mg | ORAL_TABLET | Freq: Three times a day (TID) | ORAL | Status: DC | PRN
  Administered 2023-11-24: 5 mg via ORAL
  Filled 2023-11-23: qty 1

## 2023-11-23 MED ORDER — ALUM & MAG HYDROXIDE-SIMETH 200-200-20 MG/5ML PO SUSP: 30.0000 mL | ORAL | Status: DC | PRN

## 2023-11-23 MED ORDER — LURASIDONE HCL 40 MG PO TABS
20.0000 mg | ORAL_TABLET | Freq: Every day | ORAL | Status: DC
  Administered 2023-11-24 – 2023-11-30 (×9): 20 mg via ORAL
  Filled 2023-11-23 (×7): qty 1

## 2023-11-23 MED ORDER — SIMETHICONE 80 MG PO CHEW
80.0000 mg | CHEWABLE_TABLET | Freq: Three times a day (TID) | ORAL | Status: DC
  Administered 2023-11-24 – 2023-12-01 (×31): 80 mg via ORAL
  Filled 2023-11-23 (×23): qty 1

## 2023-11-23 MED ORDER — SENNOSIDES-DOCUSATE SODIUM 8.6-50 MG PO TABS: 2.0000 | ORAL_TABLET | Freq: Every evening | ORAL | 0 refills | Status: DC | PRN

## 2023-11-23 MED ORDER — ACETAMINOPHEN 500 MG PO TABS: 1000.0000 mg | ORAL_TABLET | Freq: Four times a day (QID) | ORAL | 0 refills | Status: AC | PRN

## 2023-11-23 MED ORDER — ONDANSETRON HCL 4 MG/2ML IJ SOLN: 4.0000 mg | Freq: Three times a day (TID) | INTRAMUSCULAR | Status: DC | PRN

## 2023-11-23 MED ORDER — FERROUS SULFATE 325 (65 FE) MG PO TABS: 325.0000 mg | ORAL_TABLET | Freq: Two times a day (BID) | ORAL | 3 refills | Status: AC

## 2023-11-23 MED ORDER — LORAZEPAM 2 MG/ML IJ SOLN: 2.0000 mg | Freq: Three times a day (TID) | INTRAMUSCULAR | Status: DC | PRN

## 2023-11-23 MED ORDER — BISACODYL 10 MG RE SUPP: 10.0000 mg | Freq: Every day | RECTAL | Status: DC | PRN

## 2023-11-23 NOTE — Consult Note (Signed)
 [facility] Consult Follow-up  Patient Name: .Deborah Mckee  MRN: [id number]  DOB: February 25, 2001  Consult Order details:  Orders (From admission, onward)     Start     Ordered   11/15/23 1724  IP CONSULT TO PSYCHIATRY       Comments: Patient's mom found suicide notes in patients car today  Ordering Provider: Aisha Heller, CNM  Provider:  (Not yet assigned)  Question Answer Comment  Location [facility]   Reason for Consult? suicide ideation      11/15/23 1724             Mode of Visit: In person    Psychiatry Consult Evaluation  Service Date: November 23, 2023 LOS:  LOS: 9 days  Chief Complaint depression   Primary Psychiatric Diagnoses  Bipolar disorder   Assessment  PLEASANT BRITZ is a 23 y.o. female admitted: Medically   She is a 23 yo G1P0 female presenting at [redacted]w[redacted]d IUP with concerns for PPROM 2 days ago without preterm labor. Pregnancy has also been complicated by extensive mental health history including bipolar affective disorder, substance abuse (UDS positive for cocaine, THC, benzos), Chlamydia infection (recent treatment, TOC positive on admission). Plan is currently for monitoring, possible induction on/after 8/1 ([redacted]w[redacted]d). Patient reported one time use of cocaine following break up with bf they wrote suicide notes as they feared they were going to lose custody of the child. They denied SI on admission and deny current. They are poorly compliant with current medications will continue lithium , level ordered. They have established outpatient follow up. Suicide precautions started for monitoring. Psychiatry was involved in safety assessment and plan.   11/23/23: Patient was seen by this provider today as there are multiple concerns and confusions about patient's disposition of IVC and admission to inpatient psychiatric admission due to discontinued suicide precautions on Saturday.  It was clarified to the team and to the patient that  suicide precautions were discontinued to give patient some privacy during her childbirth considering her mental health lability and stress.  Given the extensive psychiatric history, multiple hospitalizations, traumatic childhood, worsening mood lability, recent relapse on drugs, conflict with the father of the baby, premature labor, technically homeless, CPS involvement and the possibility of patient not able to secure her baby, family history of patient's father died by suicide, patient unemployed currently-are all considered to be high risk factors for suicide especially at the postpartum period.  Patient was also offered that the need for IVC will be evaluated tomorrow once she comes to the inpatient psychiatric unit.  Patient agreed to participate in treatment plan and wants to sign in voluntarily after she reaches the unit.  We also discussed about arranging wheelchair to attend the groups on the unit.  We are currently working with the social work team to arrange for iPads where she can see the baby in the NICU as visitations with the baby from my psychiatric unit needs discussions from Art gallery manager.  IVC will be maintained at this time until patient is safely transported to inpatient psychiatric unit.  Patient expressed her understanding about the change in her legal status from voluntary to involuntary and the reason for discontinuation of her suicide precautions to give her privacy for her labor.   Diagnoses:  Active Hospital problems: Principal Problem:   S/P cesarean section Active Problems:   Bipolar affective disorder, current episode mixed (HCC)   Preterm premature rupture of membranes   Chlamydia trachomatis infection in  mother during third trimester of pregnancy   Rh negative state in antepartum period   Bipolar disorder current episode depressed (HCC)   Cocaine use complicating pregnancy   Marijuana use during pregnancy   Substance abuse affecting pregnancy, antepartum  (HCC)    Plan   ## Psychiatric Medication Recommendations:  Continue Lithium  600 mg daily at this time, pt reports intermittent compliance. Discontinue Abilify  20 mg as pt reports she has not been taking. Will d/c trazodone  as pt has not been taking. Continue  Latuda  post delivery.   ## Medical Decision Making Capacity: Not specifically addressed in this encounter  ## Further Work-up: No other work up recommended  - -- Pertinent labwork reviewed earlier this admission   ## Disposition:--Was admitted to inpatient psychiatric unit on IVC  ## Behavioral / Environmental: - Patients with borderline personality traits/disorder often use the language of physical pain to communicate both physical and emotional suffering. It is important to address pain complaints as they arise and attempt to identify an etiology, either organic or psychiatric. In patients with chronic pain, it is important to have a discussion with the patient about expectations about pain control.    ## Safety and Observation Level:  - Based on my clinical evaluation, I estimate the patient to be at moderate risk of self harm in the current setting. - At this time, we recommend  1:1 This decision is based on my review of the chart including patient's history and current presentation, interview of the patient, mental status examination, and consideration of suicide risk including evaluating suicidal ideation, plan, intent, suicidal or self-harm behaviors, risk factors, and protective factors. This judgment is based on our ability to directly address suicide risk, implement suicide prevention strategies, and develop a safety plan while the patient is in the clinical setting. Please contact our team if there is a concern that risk level has changed.  CSSR Risk Category:Moderate Risk  Suicide Risk Assessment: Patient has following modifiable risk factors for suicide: medication noncompliance and active mental illness (to encompass  adhd, tbi, mania, psychosis, trauma reaction), which we are addressing by inpatient psychiatric admission Patient has following non-modifiable or demographic risk factors for suicide: history of self harm behavior Patient has the following protective factors against suicide: Supportive family and Supportive friends  Thank you for this consult request. Recommendations have been communicated to the primary team.  We will continue to follow at this time.   Harla Mensch, MD       History of Present Illness  Relevant Aspects of [facility]  11/23/23: Patient is noted to be crying in her room and the charge nurse was next to her.  Provider requested to talk to the patient alone but in the presence of psychiatric nurse practitioner Crystal.  Patient expressed her frustration about the confusion about her disposition.  She reports that discontinuing the suicidal precautions she thought that she is getting discharged home today.  Provider discussed her case and the current psychosocial stressors.  Patient was able to acknowledge that she technically is homeless and is unable to continue the job, poor support from the baby's father and her current relapse on drugs involving CPS has made her depression anxiety worse.  She was tearful throughout the conversation and blames her mom to put her in the situation of IVC.  Eventually patient was able to understand the high risk she carries with suicide given her postpartum time and the ongoing psychosocial stressors.  Psychiatry also explained that completing her treatment  with the psychiatric team is going to help and assist her in meeting her goals of parenting with the baby and CPS.  Psychiatry also explained that patient will be connected to outpatient mental health resources where she can get ongoing help and be able to stay safe with the baby and her mom's house.  All questions and concerns were answered during this visit.  Patient requested to be changed to  voluntary once she arrives to the unit.  Provider discussed in detail that as long as patient is agreeable to the treatment plan and is able to maintain safe behaviors on the unit patient will be given the opportunity to sign voluntary.  Provider reached out to mom for collateral information who expressed her fears about patient's safety and current patient social situation of being homeless and no job with the baby and no help from the baby's father.  Mom also expressed her concern about the suicide letters that she wrote. Psychiatric and Social History  Psychiatric History:  Information collected from patient, mother, and chart  Prev Dx/Sx: bipolar substance abuse Current Psych Provider: Mliss Parsley Home Meds (current): see chart Previous Med Trials: see chart Therapy: will establish on 11/22/23  Prior Psych Hospitalization: May 2024  Prior Self Harm: yes Prior Violence: denies  Family Psych History: father BPD Family Hx suicide: denies  Social History:   Educational Hx: some college Occupational Hx: works Development worker, community Hx: denies Living Situation: with mother Spiritual Hx: chrisitan Access to weapons/lethal means: denies   Substance History Alcohol: denies  Type of alcohol denies  Last Drink denies  Number of drinks per day denies  History of alcohol withdrawal seizures denies  History of DT's denies  Tobacco: denies  Illicit drugs: denies  Prescription drug abuse: denies  Rehab hx: denies   Exam Findings  Physical Exam: I agree with initial exam Vital Signs:  Temp:  [98.1 F (36.7 C)] 98.1 F (36.7 C) (08/05 0831) Pulse Rate:  [66-69] 66 (08/05 0831) Resp:  [16-18] 16 (08/05 0831) BP: (93-118)/(50-74) 118/74 (08/05 0831) SpO2:  [100 %] 100 % (08/05 0831) Blood pressure 118/74, pulse 66, temperature 98.1 F (36.7 C), temperature source Oral, resp. rate 16, height 5' 6 (1.676 m), weight 61.2 kg, last menstrual period 03/26/2023, SpO2 100%, unknown if  currently breastfeeding. Body mass index is 21.79 kg/m.    Mental Status Exam: General Appearance: Well Groomed  Orientation:  Full (Time, Place, and Person)  Memory:  Immediate;   Good Recent;   Good Remote;   Good  Concentration:  Concentration: Good and Attention Span: Good  Recall:  Good  Attention  Good  Eye Contact:  Good  Speech:  Normal Rate  Language:  Good  Volume:  Normal  Mood: euthymic  Affect:  Appropriate  Thought Process:  Goal Directed  Thought Content:  WDL  Suicidal Thoughts:  No  Homicidal Thoughts:  No  Judgement:  Good  Insight:  Fair  Psychomotor Activity:  Normal  Akathisia:  Negative  Fund of Knowledge:  Good      Assets:  Desire for Improvement  Cognition:  WNL  ADL's:  Intact  AIMS (if indicated):        Other History   These have been pulled in through the EMR, reviewed, and updated if appropriate.  Family History:  The patient's family history is not on file.  Medical History: Past Medical History:  Diagnosis Date   Anxiety    Chlamydia    Depression  History of cocaine abuse (HCC)    Vaccine for human papilloma virus (HPV) types 6, 11, 16, and 18 administered    Vision abnormalities    wears glasses    Surgical History: Past Surgical History:  Procedure Laterality Date   INGUINAL HERNIA PEDIATRIC WITH LAPAROSCOPIC EXAM Left 04/22/2017   Procedure: INGUINAL HERNIA PEDIATRIC WITH LAP LOOK OF PELVIC FLOOR;  Surgeon: Claudius Kaplan, MD;  Location: [facility];  Service: Pediatrics;  Laterality: Left;   INGUINAL HERNIA REPAIR Right 2008   LAPAROTOMY N/A 11/21/2023   Procedure: Revision of C Section Incision;  Surgeon: Verdon Keen, MD;  Location: [facility];  Service: Gynecology;  Laterality: N/A;     Medications:   Current Facility-Administered Medications:    acetaminophen  (TYLENOL ) tablet 1,000 mg, 1,000 mg, Oral, Q6H, Verdon Keen, MD, 1,000 mg at 11/23/23 1451   bisacodyl  (DULCOLAX) suppository 10  mg, 10 mg, Rectal, Daily PRN, Verdon Keen, MD   cephALEXin  (KEFLEX ) capsule 500 mg, 500 mg, Oral, Q12H, Verdon Keen, MD, 500 mg at 11/23/23 1040   coconut oil, 1 Application, Topical, PRN, Verdon Keen, MD   witch hazel-glycerin  (TUCKS) pad 1 Application, 1 Application, Topical, PRN **AND** dibucaine (NUPERCAINAL) 1 % rectal ointment 1 Application, 1 Application, Rectal, PRN, Verdon Keen, MD   diphenhydrAMINE  (BENADRYL ) capsule 25 mg, 25 mg, Oral, Q6H PRN, Verdon Keen, MD   ferrous sulfate  tablet 325 mg, 325 mg, Oral, BID WC, Verdon Keen, MD, 325 mg at 11/23/23 0827   gabapentin  (NEURONTIN ) capsule 300 mg, 300 mg, Oral, QHS, Verdon Keen, MD, 300 mg at 11/22/23 2115   [EXPIRED] ketorolac  (TORADOL ) 30 MG/ML injection 30 mg, 30 mg, Intravenous, Q6H, 30 mg at 11/21/23 1511 **FOLLOWED BY** ibuprofen  (ADVIL ) tablet 600 mg, 600 mg, Oral, Q6H, Verdon Keen, MD, 600 mg at 11/23/23 1451   lactated ringers  infusion, , Intravenous, Continuous, Verdon Keen, MD, Last Rate: 125 mL/hr at 11/21/23 2051, New Bag at 11/21/23 2051   lithium  carbonate (LITHOBID ) ER tablet 600 mg, 600 mg, Oral, Daily, Verdon Keen, MD, 600 mg at 11/23/23 1040   lurasidone  (LATUDA ) tablet 20 mg, 20 mg, Oral, Q supper, Verdon Keen, MD, 20 mg at 11/22/23 1732   measles, mumps & rubella vaccine (MMR) injection 0.5 mL, 0.5 mL, Subcutaneous, Prior to discharge, Verdon Keen, MD   menthol -cetylpyridinium (CEPACOL) lozenge 3 mg, 1 lozenge, Oral, Q2H PRN, Verdon Keen, MD   naloxone  (NARCAN ) injection 0.4 mg, 0.4 mg, Intravenous, PRN **AND** sodium chloride  flush (NS) 0.9 % injection 3 mL, 3 mL, Intravenous, PRN, Vicci Camellia Glatter, MD   naloxone  HCl (NARCAN ) 2 mg in dextrose  5 % 250 mL infusion, 1-2 mcg/kg/hr, Intravenous, Continuous PRN, Vicci Camellia Glatter, MD   ondansetron  (ZOFRAN ) injection 4 mg, 4 mg, Intravenous, Q8H PRN, Vicci Camellia Glatter, MD   oxyCODONE  (Oxy  IR/ROXICODONE ) immediate release tablet 5 mg, 5 mg, Oral, Q6H PRN, Vicci Camellia Glatter, MD, 5 mg at 11/22/23 1014   oxyCODONE  (Oxy IR/ROXICODONE ) immediate release tablet 5-10 mg, 5-10 mg, Oral, Q4H PRN, Verdon Keen, MD, 5 mg at 11/23/23 1450   prenatal multivitamin tablet 1 tablet, 1 tablet, Oral, Q1200, Verdon Keen, MD, 1 tablet at 11/23/23 1451   scopolamine  (TRANSDERM-SCOP) 1 MG/3DAYS 1.5 mg, 1 patch, Transdermal, Once, Vicci Camellia Glatter, MD, 1.5 mg at 11/21/23 0056   senna-docusate (Senokot-S) tablet 2 tablet, 2 tablet, Oral, Q24H, Verdon Keen, MD, 2 tablet at 11/22/23 2115   simethicone  (MYLICON) chewable tablet 80 mg, 80 mg, Oral, TID PC,  Verdon Keen, MD, 80 mg at 11/23/23 1451   simethicone  (MYLICON) chewable tablet 80 mg, 80 mg, Oral, PRN, Verdon Keen, MD   sodium phosphate  (FLEET) enema 1 enema, 1 enema, Rectal, Daily PRN, Verdon Keen, MD   Tdap (BOOSTRIX) injection 0.5 mL, 0.5 mL, Intramuscular, Once, Verdon Keen, MD  Allergies: Allergies  Allergen Reactions   Penicillins Rash and Other (See Comments)    Allizon Woznick, MD

## 2023-11-23 NOTE — Tx Team (Signed)
 Initial Treatment Plan 11/23/2023 8:48 PM Deborah Mckee FMW:[id number]    PATIENT STRESSORS: Marital or family conflict   Substance abuse     PATIENT STRENGTHS: Motivation for treatment/growth  Supportive family/friends    PATIENT IDENTIFIED PROBLEMS: Depression  SI  Anxiety                  DISCHARGE CRITERIA:  Motivation to continue treatment in a less acute level of care Verbal commitment to aftercare and medication compliance  PRELIMINARY DISCHARGE PLAN: Outpatient therapy Return to previous living arrangement  PATIENT/FAMILY INVOLVEMENT: This treatment plan has been presented to and reviewed with the patient, Deborah Mckee. The patient has been given the opportunity to ask questions and make suggestions.  Donnice ONEIDA Chess, RN 11/23/2023, 8:48 PM

## 2023-11-23 NOTE — Plan of Care (Signed)
 Pt new to the unit, hasn't had time to progress  Problem: Education: Goal: Knowledge of [facility] Education information/materials will improve Outcome: Not Progressing Goal: Emotional status will improve Outcome: Not Progressing Goal: Mental status will improve Outcome: Not Progressing Goal: Verbalization of understanding the information provided will improve Outcome: Not Progressing   Problem: Activity: Goal: Interest or engagement in activities will improve Outcome: Not Progressing Goal: Sleeping patterns will improve Outcome: Not Progressing   Problem: Coping: Goal: Ability to verbalize frustrations and anger appropriately will improve Outcome: Not Progressing Goal: Ability to demonstrate self-control will improve Outcome: Not Progressing   Problem: Health Behavior/Discharge Planning: Goal: Identification of resources available to assist in meeting health care needs will improve Outcome: Not Progressing Goal: Compliance with treatment plan for underlying cause of condition will improve Outcome: Not Progressing   Problem: Physical Regulation: Goal: Ability to maintain clinical measurements within normal limits will improve Outcome: Not Progressing   Problem: Safety: Goal: Periods of time without injury will increase Outcome: Not Progressing

## 2023-11-23 NOTE — Discharge Summary (Signed)
 Report given to Behavioral health nurse. Will d/c from system and readmit to [facility].

## 2023-11-23 NOTE — Group Note (Signed)
 Date:  11/23/2023 Time:  10:48 PM  Group Topic/Focus:  Spirituality:   The focus of this group is to discuss how one's spirituality can aide in recovery.    Participation Level:  Did Not Attend  Participation Quality:  NONE  Affect:  NONE  Cognitive:  NONE  Insight: None  Engagement in Group:  NONE  Modes of Intervention:  NONE  Additional Comments:  NONE   Shyrl Obi 11/23/2023, 10:48 PM

## 2023-11-23 NOTE — Progress Notes (Signed)
 Pt presents tearful and in pain from recent C-section surgery over the weekend. Pt has incision from surgery but it was wrapped in L&D prior to coming down to our unit. Pt denies SI/HI/AVH. Pt endorses anxiety and depression. Pt stated her anxiety is from being away form her new born baby. Pt oriented to the unit. Pt compliant with medication administration per MD orders. Pt given education, support, and encouragement to be active in her treatment plan. Pt being monitored Q 15 minutes for safety per unit protocol, remains safe on the unit

## 2023-11-24 DIAGNOSIS — F332 Major depressive disorder, recurrent severe without psychotic features: Secondary | ICD-10-CM | POA: Diagnosis not present

## 2023-11-24 NOTE — Group Note (Signed)
 Date:  11/24/2023 Time:  5:24 PM  Group Topic/Focus:  Managing Feelings:   The focus of this group is to identify what feelings patients have difficulty handling and develop a plan to handle them in a healthier way upon discharge.    Participation Level:  Did Not Attend   Camellia HERO Ciarra Braddy 11/24/2023, 5:24 PM

## 2023-11-24 NOTE — [facility] Counselor (Signed)
 CSW contacted Harlene Shorter, [facility] for [facility] at [phone], per secure chat she will be able to assist in development of plan to allow patient to have a FaceTime with her son.  CSW was informed that the phone [phone] does not have camera capabilities.  CSW informed that the phones and iPads on BMU do not have FaceTime abilities.   CSW was willing to use work cell, however, no receiving phone has the ability.   CSW inquired about Teams options and was advised to call [phone] and speak with Charge Nurse Amy on ability to complete this.  CSW called Charge Nurse Amy who requested that CSW call [phone].  CSW explained that CSW called that number initially and they advised that call Charge Nurse.   Charge Nurse reports that she will call back.    CSW has received several call backs.  Barrier remains that no one is available for a Teams call it was asked by this Clinical research associate if she can send the Teams link to staff and was asked if a generalized link can be created.  CSW explained that email would have to be sent to a specific person(s) who could then open the Teams link.   CSW was advised that a call back would occur.  Situation ongoing.    Sherryle Margo, MSW, LCSW 11/24/2023 3:43 PM

## 2023-11-24 NOTE — Plan of Care (Signed)

## 2023-11-24 NOTE — [facility] Counselor (Signed)
 CSW has spoke with [facility] Supervisor Massie about arranging for facetime visits between patient and newborn.   CSW is waiting on follow up on the procedure/process for allowing this.   Sherryle Margo, MSW, LCSW 11/24/2023 2:45 PM
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
## 2023-11-24 NOTE — Progress Notes (Signed)
   11/24/23 0748  Psychosocial Assessment  Patient Complaints Decreased concentration;Crying spells;Depression;Helplessness;Anxiety;Anger;Agitation  Eye Contact Brief  Facial Expression Grimacing;Pained;Sad;Worried  Affect Anxious  Speech Logical/coherent  Interaction Demanding;Isolative;Minimal;Other (Comment) (aggitated and demanding that she see her baby; was told by social work that she could facetime with her baby; Environmental education officer following up with social work)  Pharmacist, hospital Activity Slow  Appearance/Hygiene Unremarkable  Behavior Characteristics Anxious  Mood Anxious  Aggressive Behavior  Effect No apparent injury  Thought Process  Coherency WDL  Content WDL  Delusions None reported or observed  Perception WDL  Hallucination None reported or observed  Judgment Poor  Confusion None  Danger to Self  Current suicidal ideation?  (Denies)  Description of Suicide Plan denies plan  Self-Injurious Behavior  (none)  Agreement Not to Harm Self Yes  Description of Agreement verbal  Danger to Others  Danger to Others None reported or observed

## 2023-11-24 NOTE — BHH Counselor (Signed)
 CSW spoke with Doyne Creed, [facility] DSS Social Worker, [phone] cell.  She reports that the patient was provided a plan for her son on 11/23/2023.  She reports that DSS is involved because of the baby being born with drugs in his system.    She reports that patient IS allowed to have facetime visits with her newborn.    [facility] DSS reports that a more extensive planning will need to be developed for pt being discharged and mother will need to ok being the primary caretaker for the newborn for patient and baby to be discharged to the same location.    CSW reviewed need to call and plan ahead for any visitation and provided contact information.  Sherryle Margo, MSW, LCSW 11/24/2023 2:43 PM

## 2023-11-24 NOTE — BHH Suicide Risk Assessment (Signed)
 [facility] Admission Suicide Risk Assessment   Nursing information obtained from:    Demographic factors:  Adolescent or young adult Current Mental Status:  NA Loss Factors:  Financial problems / change in socioeconomic status Historical Factors:  Prior suicide attempts Risk Reduction Factors:  Pregnancy  Total Time spent with patient: 30 minutes Principal Problem: MDD (major depressive disorder), recurrent severe, without psychosis (HCC) Diagnosis:  Principal Problem:   MDD (major depressive disorder), recurrent severe, without psychosis (HCC)  Subjective Data: Deborah Mckee.Cham is a 23 yo G1P0 female presenting at [redacted]w[redacted]d IUP with concerns for PPROM 2 days ago without preterm labor. Pregnancy has also been complicated by extensive mental health history including bipolar affective disorder, substance abuse (UDS positive for cocaine, THC, benzos), Chlamydia infection (recent treatment, TOC positive on admission). Plan is currently for monitoring, possible induction on/after 8/1 (110w0d). Patient reported one time use of cocaine following break up with bf they wrote suicide notes as they feared they were going to lose custody of the child Patient is admitted to adult psych unit with Q15 min safety monitoring. Multidisciplinary team approach is offered. Medication management; group/milieu therapy is offered.   Continued Clinical Symptoms:  Alcohol Use Disorder Identification Test Final Score (AUDIT): 0 The Alcohol Use Disorders Identification Test, Guidelines for Use in Primary Care, Second Edition.  World Science writer [facility]). Score between 0-7:  no or low risk or alcohol related problems. Score between 8-15:  moderate risk of alcohol related problems. Score between 16-19:  high risk of alcohol related problems. Score 20 or above:  warrants further diagnostic evaluation for alcohol dependence and treatment.   CLINICAL FACTORS:   Bipolar Disorder:   Bipolar II   Musculoskeletal: Strength &  Muscle Tone: within normal limits Gait & Station: normal Patient leans: N/A  Psychiatric Specialty Exam:  Presentation  General Appearance:  Appropriate for Environment; Casual  Eye Contact: Fair  Speech: Clear and Coherent  Speech Volume: Normal  Handedness: Right   Mood and Affect  Mood: Anxious; Depressed; Dysphoric; Irritable  Affect: Labile   Thought Process  Thought Processes: Coherent  Descriptions of Associations:Intact  Orientation:Full (Time, Place and Person)  Thought Content:Illogical  History of Schizophrenia/Schizoaffective disorder:No data recorded Duration of Psychotic Symptoms:No data recorded Hallucinations:Hallucinations: None  Ideas of Reference:None  Suicidal Thoughts:Suicidal Thoughts: No  Homicidal Thoughts:Homicidal Thoughts: No   Sensorium  Memory: Immediate Fair; Recent Fair; Remote Fair  Judgment: Fair  Insight: Fair   Art therapist  Concentration: Fair  Attention Span: Fair  Recall: [employer] of Knowledge: Fair  Language: Fair   Psychomotor Activity  Psychomotor Activity: Psychomotor Activity: Normal   Assets  Assets: Communication Skills; Resilience; Social Support   Sleep  Sleep: Sleep: Fair    Physical Exam: Physical Exam Vitals and nursing note reviewed.    ROS Blood pressure (!) 113/53, pulse 68, temperature 98.1 F (36.7 C), resp. rate 17, height 5' 6 (1.676 m), weight 61.2 kg, last menstrual period 03/26/2023, SpO2 100%, unknown if currently breastfeeding. Body mass index is 21.78 kg/m.   COGNITIVE FEATURES THAT CONTRIBUTE TO RISK:  None    SUICIDE RISK:   Moderate:  Frequent suicidal ideation with limited intensity, and duration, some specificity in terms of plans, no associated intent, good self-control, limited dysphoria/symptomatology, some risk factors present, and identifiable protective factors, including available and accessible social support.  PLAN OF  CARE: Patient is admitted to adult psych unit with Q15 min safety monitoring. Multidisciplinary team approach is offered. Medication management; group/milieu  therapy is offered.   I certify that inpatient services furnished can reasonably be expected to improve the patient's condition.   Allyn Foil, MD 11/24/2023, 9:41 PM

## 2023-11-24 NOTE — Group Note (Signed)
 Date:  11/24/2023 Time:  8:51 PM  Group Topic/Focus:  Wrap-Up Group:   The focus of this group is to help patients review their daily goal of treatment and discuss progress on daily workbooks.    Participation Level:  Did Not Attend   Deborah Mckee 11/24/2023, 8:51 PM

## 2023-11-24 NOTE — BHH Counselor (Signed)
 CSW spoke with the patient's mother, Deborah Mckee, mother, [phone]   Mother reports that patient has not lived with her "for about 2 years".  She reports that "2 weeks ago she and the father of the child started falling out and she asked if she could stay with me while they figured out".  Mother reports that while there she noticed concerning behaviors "like calling out of work".    She reports that pt "was left there by herself", referring to home.  She reports that the patient had relapsed on substance use.  She reports that she is unsure of when the relapse began or how much was used.    She reports that she also found "some suicide letters that she had written".  She reports "I have never seen letters before".  She reports that pt has an extensive history of mental health.  She reports that she became increasingly concerned with the letters because "she has never done that before".    She reports that she does not feel that patient s a danger to self or others.  She reports that she is concerned with the suicide letters in conjunction with being postpartum.    Mother reports that there are weapons in her home, however, patient does not have access to them or knowledge to their locations.  She reports that she is unsure of any weapons at patient's partners home "thought I know that he carries".    Mother reports that patient is allowed to return to her home at discharge "if she has a plan  Going to mothers at discharge "if she has a plan and as long as she follows every advice and follow up that she is supposed to do from an outpatient standpoint".  Sherryle Margo, MSW, LCSW 11/24/2023 2:01 PM

## 2023-11-24 NOTE — Progress Notes (Signed)
 PT Cancellation Note  Patient Details Name: Deborah Mckee MRN: [id number] DOB: 11/13/2000   Cancelled Treatment:    Reason Eval/Treat Not Completed: Other (comment). Consult received and chart reviewed. Discussed with primary RN who reports pt needs use of WC for attending group sessions as she is recovering from C-section, in postpartum at this time. Discussed that therapy team does not provide equipment during hospital stay. Recommend to discuss with nursing leaders to obtain equipment needed for in patient use. No further needs at this time. Will dc in house at this time.   Montzerrat Brunell 11/24/2023, 2:15 PM Corean Dade, PT, DPT, GCS [phone]

## 2023-11-24 NOTE — BHH Suicide Risk Assessment (Signed)
 B[facility] INPATIENT:  Family/Significant Other Suicide Prevention Education  Suicide Prevention Education:  Education Completed; Deborah Mckee, mother, [phone] has been identified by the patient as the family member/significant other with whom the patient will be residing, and identified as the person(s) who will aid the patient in the event of a mental health crisis (suicidal ideations/suicide attempt).  With written consent from the patient, the family member/significant other has been provided the following suicide prevention education, prior to the and/or following the discharge of the patient.  The suicide prevention education provided includes the following: Suicide risk factors Suicide prevention and interventions National Suicide Hotline telephone number [facility] assessment telephone number [facility] Emergency Assistance 911 [facility] and/or Residential Mobile Crisis Unit telephone number  Request made of family/significant other to: Remove weapons (e.g., guns, rifles, knives), all items previously/currently identified as safety concern.   Remove drugs/medications (over-the-counter, prescriptions, illicit drugs), all items previously/currently identified as a safety concern.  The family member/significant other verbalizes understanding of the suicide prevention education information provided.  The family member/significant other agrees to remove the items of safety concern listed above.  Deborah Mckee 11/24/2023, 1:50 PM

## 2023-11-24 NOTE — Group Note (Signed)
 BHH LCSW Group Therapy Note   Group Date: 11/24/2023 Start Time: 1300 End Time: 1400   Type of Therapy/Topic:  Group Therapy:  Emotion Regulation  Participation Level:  Did Not Attend    Description of Group:    The purpose of this group is to assist patients in learning to regulate negative emotions and experience positive emotions. Patients will be guided to discuss ways in which they have been vulnerable to their negative emotions. These vulnerabilities will be juxtaposed with experiences of positive emotions or situations, and patients challenged to use positive emotions to combat negative ones. Special emphasis will be placed on coping with negative emotions in conflict situations, and patients will process healthy conflict resolution skills.  Therapeutic Goals: Patient will identify two positive emotions or experiences to reflect on in order to balance out negative emotions:  Patient will label two or more emotions that they find the most difficult to experience:  Patient will be able to demonstrate positive conflict resolution skills through discussion or role plays:   Summary of Patient Progress: Pt did not attend group.    Therapeutic Modalities:   Cognitive Behavioral Therapy Feelings Identification Dialectical Behavioral Therapy   Nadara JONELLE Fam, LCSW

## 2023-11-24 NOTE — Progress Notes (Signed)
   11/24/23 1100  Spiritual Encounters  Type of Visit Initial  Care provided to: Patient  Conversation partners present during encounter Nurse  Referral source Nurse (RN/NT/LPN)  Reason for visit Routine spiritual support  OnCall Visit No   Chaplain visited patient per the recommendation of staff who called the Chaplain phone.  Patient was tearful.  She blames herself for the baby being premature due to drug use.  Patient shared past trauma and relational issues with friends and loved ones.  Patient said she's a Believer but doesn't feel she deserves to receive God's love so, while she prays for her son, she doesn't pray for herself.  Chaplain shared short prayers she could say to God when she's ready and rejoiced with patient about the possibility of seeing her son on a video call later today.  Chaplain also celebrated the patient's awareness of her feelings and ways she can redirect her thoughts.     Rev. Rana M. Nicholaus, M.Div. Chaplain Resident [facility]

## 2023-11-24 NOTE — BH IP Treatment Plan (Signed)
 Interdisciplinary Treatment and Diagnostic Plan Update  11/24/2023 Time of Session: 10:00AM Deborah Mckee MRN: [id number]  Principal Diagnosis: MDD (major depressive disorder), recurrent severe, without psychosis (HCC)  Secondary Diagnoses: Principal Problem:   MDD (major depressive disorder), recurrent severe, without psychosis (HCC)   Current Medications:  Current Facility-Administered Medications  Medication Dose Route Frequency Provider Last Rate Last Admin   acetaminophen  (TYLENOL ) tablet 1,000 mg  1,000 mg Oral Q6H Jadapalle, Sree, MD   1,000 mg at 11/24/23 9251   acetaminophen  (TYLENOL ) tablet 650 mg  650 mg Oral Q6H PRN Jadapalle, Sree, MD       alum & mag hydroxide-simeth (MAALOX/MYLANTA) 200-200-20 MG/5ML suspension 30 mL  30 mL Oral Q4H PRN Jadapalle, Sree, MD       bisacodyl  (DULCOLAX) suppository 10 mg  10 mg Rectal Daily PRN Jadapalle, Sree, MD       cephALEXin  (KEFLEX ) capsule 500 mg  500 mg Oral Q12H Jadapalle, Sree, MD   500 mg at 11/24/23 0747   coconut oil  1 Application Topical PRN Donnelly Mellow, MD       witch hazel-glycerin  (TUCKS) pad 1 Application  1 Application Topical PRN Jadapalle, Sree, MD       And   dibucaine (NUPERCAINAL) 1 % rectal ointment 1 Application  1 Application Rectal PRN Jadapalle, Sree, MD       diphenhydrAMINE  (BENADRYL ) capsule 25 mg  25 mg Oral Q6H PRN Jadapalle, Sree, MD       haloperidol  (HALDOL ) tablet 5 mg  5 mg Oral TID PRN Jadapalle, Sree, MD       And   diphenhydrAMINE  (BENADRYL ) capsule 50 mg  50 mg Oral TID PRN Jadapalle, Sree, MD       haloperidol  lactate (HALDOL ) injection 5 mg  5 mg Intramuscular TID PRN Jadapalle, Sree, MD       And   diphenhydrAMINE  (BENADRYL ) injection 50 mg  50 mg Intramuscular TID PRN Jadapalle, Sree, MD       And   LORazepam  (ATIVAN ) injection 2 mg  2 mg Intramuscular TID PRN Jadapalle, Sree, MD       haloperidol  lactate (HALDOL ) injection 10 mg  10 mg Intramuscular TID PRN Jadapalle, Sree, MD       And    diphenhydrAMINE  (BENADRYL ) injection 50 mg  50 mg Intramuscular TID PRN Jadapalle, Sree, MD       And   LORazepam  (ATIVAN ) injection 2 mg  2 mg Intramuscular TID PRN Jadapalle, Sree, MD       ferrous sulfate  tablet 325 mg  325 mg Oral BID WC Jadapalle, Sree, MD   325 mg at 11/24/23 0747   gabapentin  (NEURONTIN ) capsule 300 mg  300 mg Oral QHS Jadapalle, Sree, MD   300 mg at 11/23/23 2122   hydrOXYzine  (ATARAX ) tablet 25 mg  25 mg Oral Q6H PRN Jadapalle, Sree, MD       ibuprofen  (ADVIL ) tablet 600 mg  600 mg Oral Q6H Jadapalle, Sree, MD   600 mg at 11/24/23 0615   lithium  carbonate (LITHOBID ) ER tablet 600 mg  600 mg Oral Daily Jadapalle, Sree, MD   600 mg at 11/24/23 0747   lurasidone  (LATUDA ) tablet 20 mg  20 mg Oral Q supper Jadapalle, Sree, MD       magnesium  hydroxide (MILK OF MAGNESIA) suspension 30 mL  30 mL Oral Daily PRN Donnelly Mellow, MD       ondansetron  (ZOFRAN ) tablet 4 mg  4 mg Oral Q8H PRN Jadapalle, Sree,  MD       oxyCODONE  (Oxy IR/ROXICODONE ) immediate release tablet 5 mg  5 mg Oral Q6H PRN Jadapalle, Sree, MD   5 mg at 11/24/23 9381   prenatal multivitamin tablet 1 tablet  1 tablet Oral Q1200 Jadapalle, Sree, MD       senna-docusate (Senokot-S) tablet 2 tablet  2 tablet Oral Q24H Jadapalle, Sree, MD   2 tablet at 11/23/23 2122   simethicone  [facility]) chewable tablet 80 mg  80 mg Oral TID PC Jadapalle, Sree, MD   80 mg at 11/24/23 0848   simethicone  (MYLICON) chewable tablet 80 mg  80 mg Oral PRN Jadapalle, Sree, MD       PTA Medications: Medications Prior to Admission  Medication Sig Dispense Refill Last Dose/Taking   acetaminophen  (TYLENOL ) 500 MG tablet Take 2 tablets (1,000 mg total) by mouth every 6 (six) hours as needed for mild pain (pain score 1-3) or fever. 30 tablet 0    cephALEXin  (KEFLEX ) 500 MG capsule Take 1 capsule (500 mg total) by mouth every 12 (twelve) hours for 3 days. 6 capsule 0    ferrous sulfate  325 (65 FE) MG tablet Take 1 tablet (325 mg total) by  mouth 2 (two) times daily with a meal. 60 tablet 3    ibuprofen  (ADVIL ) 600 MG tablet Take 1 tablet (600 mg total) by mouth every 6 (six) hours as needed for fever, mild pain (pain score 1-3) or cramping. 30 tablet 0    lithium  carbonate (LITHOBID ) 300 MG CR tablet 300 mg at bedtime. TAKE 1 TABLET BY MOUTH NIGHTLY (ALONG WITH 450 MG      lurasidone  (LATUDA ) 20 MG TABS tablet Take 1 tablet (20 mg total) by mouth daily with supper.      oxyCODONE  (OXY IR/ROXICODONE ) 5 MG immediate release tablet Take 1-2 tablets (5-10 mg total) by mouth every 4 (four) hours as needed for up to 7 days for moderate pain (pain score 4-6) or severe pain (pain score 7-10). 30 tablet 0    senna-docusate (SENOKOT-S) 8.6-50 MG tablet Take 2 tablets by mouth at bedtime as needed for mild constipation. 30 tablet 0    simethicone  (MYLICON) 80 MG chewable tablet Chew 1 tablet (80 mg total) by mouth 3 (three) times daily after meals. 30 tablet 0     Patient Stressors: Marital or family conflict   Substance abuse    Patient Strengths: Motivation for treatment/growth  Supportive family/friends   Treatment Modalities: Medication Management, Group therapy, Case management,  1 to 1 session with clinician, Psychoeducation, Recreational therapy.   Physician Treatment Plan for Primary Diagnosis: MDD (major depressive disorder), recurrent severe, without psychosis (HCC) Long Term Goal(s):     Short Term Goals:    Medication Management: Evaluate patient's response, side effects, and tolerance of medication regimen.  Therapeutic Interventions: 1 to 1 sessions, Unit Group sessions and Medication administration.  Evaluation of Outcomes: Not Met  Physician Treatment Plan for Secondary Diagnosis: Principal Problem:   MDD (major depressive disorder), recurrent severe, without psychosis (HCC)  Long Term Goal(s):     Short Term Goals:       Medication Management: Evaluate patient's response, side effects, and tolerance of  medication regimen.  Therapeutic Interventions: 1 to 1 sessions, Unit Group sessions and Medication administration.  Evaluation of Outcomes: Not Met   RN Treatment Plan for Primary Diagnosis: MDD (major depressive disorder), recurrent severe, without psychosis (HCC) Long Term Goal(s): Knowledge of disease and therapeutic regimen to maintain health will improve  Short Term Goals: Ability to demonstrate self-control, Ability to participate in decision making will improve, Ability to verbalize feelings will improve, Ability to disclose and discuss suicidal ideas, Ability to identify and develop effective coping behaviors will improve, and Compliance with prescribed medications will improve  Medication Management: RN will administer medications as ordered by provider, will assess and evaluate patient's response and provide education to patient for prescribed medication. RN will report any adverse and/or side effects to prescribing provider.  Therapeutic Interventions: 1 on 1 counseling sessions, Psychoeducation, Medication administration, Evaluate responses to treatment, Monitor vital signs and CBGs as ordered, Perform/monitor CIWA, COWS, AIMS and Fall Risk screenings as ordered, Perform wound care treatments as ordered.  Evaluation of Outcomes: Not Met   LCSW Treatment Plan for Primary Diagnosis: MDD (major depressive disorder), recurrent severe, without psychosis (HCC) Long Term Goal(s): Safe transition to appropriate next level of care at discharge, Engage patient in therapeutic group addressing interpersonal concerns.  Short Term Goals: Engage patient in aftercare planning with referrals and resources, Increase social support, Increase ability to appropriately verbalize feelings, Increase emotional regulation, Facilitate acceptance of mental health diagnosis and concerns, Facilitate patient progression through stages of change regarding substance use diagnoses and concerns, Identify triggers  associated with mental health/substance abuse issues, and Increase skills for wellness and recovery  Therapeutic Interventions: Assess for all discharge needs, 1 to 1 time with Social worker, Explore available resources and support systems, Assess for adequacy in community support network, Educate family and significant other(s) on suicide prevention, Complete Psychosocial Assessment, Interpersonal group therapy.  Evaluation of Outcomes: Not Met   Progress in Treatment: Attending groups: No. Participating in groups: No. Taking medication as prescribed: Yes. Toleration medication: Yes. Family/Significant other contact made: No, will contact:  once permission has been granted Patient understands diagnosis: Yes. Discussing patient identified problems/goals with staff: Yes. Medical problems stabilized or resolved: Yes. Denies suicidal/homicidal ideation: Yes. Issues/concerns per patient self-inventory: No. Other: none  New problem(s) identified: No, Describe:  none  New Short Term/Long Term Goal(s): detox, elimination of symptoms of psychosis, medication management for mood stabilization; elimination of SI thoughts; development of comprehensive mental wellness/sobriety plan.   Patient Goals:  get better and see my kid  Discharge Plan or Barriers: CSW to assist in the development of appropriate discharge plans.    Reason for Continuation of Hospitalization: Anxiety Depression Medical Issues Medication stabilization Suicidal ideation  Estimated Length of Stay:  1-7 days  Last 3 [place] Suicide Severity Risk Score: Flowsheet Row Admission (Current) from 11/23/2023 in [facility] ED to Hosp-Admission (Discharged) from 11/14/2023 in [facility] from 09/12/2022 in [facility] Emergency Department at [facility]  C-SSRS RISK CATEGORY No Risk No Risk High Risk    Last PHQ 2/9 Scores:     No data to display           Scribe for Treatment Team: [name], LCSW 11/24/2023 11:19 AM
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
## 2023-11-24 NOTE — B[facility] Counselor (Signed)
 CSW assisted patient in completing a [employer].    Patient met with newborn briefly before she became too emotional and asked to terminate the call.  Tentative plans are being made for a call on 11/25/2023.  Sherryle Margo, MSW, LCSW 11/24/2023 4:09 PM

## 2023-11-24 NOTE — Group Note (Signed)
 Date:  11/24/2023 Time:  3:18 PM  Group Topic/Focus:  Primary and Secondary Emotions:   The focus of this group is to discuss the difference between primary and secondary emotions.    Participation Level:  Did Not Attend   Camellia HERO Cloma Rahrig 11/24/2023, 3:18 PM

## 2023-11-25 MED ORDER — DOCUSATE SODIUM 100 MG PO CAPS
100.0000 mg | ORAL_CAPSULE | Freq: Every day | ORAL | Status: DC
  Administered 2023-11-26: 100 mg via ORAL
  Filled 2023-11-25: qty 1

## 2023-11-25 NOTE — Progress Notes (Signed)
 Patient tearful and reporting she wants to do better. Patient has attempted to pump this morning and reports she would like to pump every 3 hours today. Will continue working with patient to assist with this process.    11/25/23 1100  Psych Admission Type (Psych Patients Only)  Admission Status Voluntary  Psychosocial Assessment  Patient Complaints Crying spells;Depression;Irritability;Sadness;Worrying;Anxiety  Eye Contact Brief  Facial Expression Sad;Trembling lip  Affect Irritable;Labile;Sad;Depressed  Speech Logical/coherent  Interaction Demanding;Minimal  Motor Activity Slow  Appearance/Hygiene Unremarkable  Behavior Characteristics Cooperative;Irritable  Mood Depressed;Labile;Sad;Irritable;Anxious  Thought Process  Coherency WDL  Content Blaming others  Delusions None reported or observed  Perception WDL  Hallucination None reported or observed  Judgment Poor  Confusion None  Danger to Self  Current suicidal ideation? Denies  Self-Injurious Behavior No self-injurious ideation or behavior indicators observed or expressed   Danger to Others  Danger to Others None reported or observed

## 2023-11-25 NOTE — Group Note (Signed)
 Recreation Therapy Group Note   Group Topic:Goal Setting  Group Date: 11/25/2023 Start Time: 1000 End Time: 1055 Facilitators: Celestia Jeoffrey BRAVO, LRT, CTRS Location: Craft Room  Group Description: Product/process development scientist. Patients were given many different magazines, a glue stick, markers, and a piece of cardstock paper. LRT and pts discussed the importance of having goals in life. LRT and pts discussed the difference between short-term and long-term goals, as well as what a SMART goal is. LRT encouraged pts to create a vision board, with images they picked and then cut out with safety scissors from the magazine, for themselves, that capture their short and long-term goals. LRT encouraged pts to show and explain their vision board to the group.   Goal Area(s) Addressed:  Patient will gain knowledge of short vs. long term goals.  Patient will identify goals for themselves. Patient will practice setting SMART goals. Patient will verbalize their goals to LRT and peers.   Affect/Mood: Appropriate and Flat   Participation Level: Minimal    Clinical Observations/Individualized Feedback: Lemmie came to group with 10 minutes remaining. Pt did not make a vision board or share goals.   Plan: Continue to engage patient in RT group sessions 2-3x/week.   Jeoffrey BRAVO Celestia, LRT, CTRS 11/25/2023 1:09 PM

## 2023-11-25 NOTE — [facility] Counselor (Signed)
 CSW spoke with Doyne Creed, [facility] DSS Social Worker, [phone] cell.   CSW confirmed that patient would be allowed to do a face to face visit with the patients newborn.  Patient remains in custody of her child.    Patient is just NOT allowed to leave with the child.  Sherryle Margo, MSW, LCSW 11/25/2023 1:19 PM

## 2023-11-25 NOTE — Group Note (Signed)
 [facility] LCSW Group Therapy Note   Group Date: 11/25/2023 Start Time: 1300 End Time: 1450   Type of Therapy and Topic: Group Therapy: Avoiding Self-Sabotaging and Enabling Behaviors  Participation Level: Active  Mood: Appropriate   Description of Group:  In this group, patients will learn how to identify obstacles, self-sabotaging and enabling behaviors, as well as: what are they, why do we do them and what needs these behaviors meet. Discuss unhealthy relationships and how to have positive healthy boundaries with those that sabotage and enable. Explore aspects of self-sabotage and enabling in yourself and how to limit these self-destructive behaviors in everyday life.   Therapeutic Goals: 1. Patient will identify one obstacle that relates to self-sabotage and enabling behaviors 2. Patient will identify one personal self-sabotaging or enabling behavior they did prior to admission 3. Patient will state a plan to change the above identified behavior 4. Patient will demonstrate ability to communicate their needs through discussion and/or role play.    Summary of Patient Progress: The patient explored distorted thinking patterns and gained insight into personal maladaptive thought processes that contributed to negative emotions and behaviors. As a group, members collaborated to identify strategies for challenging and reframing these negative thoughts to promote healthier emotional responses and behavioral outcomes. The patient was receptive to peer feedback, participated openly, and actively engaged both group members and facilitators in deeper discussions about cognitive distortions and self-sabotaging thought patterns.    Therapeutic Modalities:  Cognitive Behavioral Therapy Person-Centered Therapy Motivational Interviewing    Deborah CHRISTELLA Kerns, LCSW

## 2023-11-25 NOTE — Plan of Care (Signed)
  Problem: Education: Goal: Emotional status will improve Outcome: Progressing Goal: Mental status will improve Outcome: Progressing Goal: Verbalization of understanding the information provided will improve Outcome: Progressing   Problem: Coping: Goal: Ability to verbalize frustrations and anger appropriately will improve Outcome: Progressing   Problem: Health Behavior/Discharge Planning: Goal: Compliance with treatment plan for underlying cause of condition will improve Outcome: Progressing   Problem: Physical Regulation: Goal: Ability to maintain clinical measurements within normal limits will improve Outcome: Progressing   Problem: Safety: Goal: Periods of time without injury will increase Outcome: Progressing

## 2023-11-25 NOTE — Progress Notes (Signed)
   11/24/23 2200  Psych Admission Type (Psych Patients Only)  Admission Status Involuntary  Psychosocial Assessment  Patient Complaints Crying spells;Depression  Eye Contact Brief  Facial Expression Sad  Affect Anxious  Speech Logical/coherent  Interaction Isolative  Motor Activity Slow  Appearance/Hygiene Unremarkable  Behavior Characteristics Cooperative  Mood Sad  Aggressive Behavior  Effect No apparent injury  Thought Process  Coherency WDL  Content WDL  Delusions None reported or observed  Perception WDL  Hallucination None reported or observed  Judgment Poor  Confusion None  Danger to Self  Agreement Not to Harm Self Yes  Description of Agreement verbal  Danger to Others  Danger to Others None reported or observed

## 2023-11-25 NOTE — Evaluation (Signed)
 Occupational Therapy Evaluation Patient Details Name: Deborah Mckee MRN: [id number] DOB: Apr 23, 2000 Today's Date: 11/25/2023   History of Present Illness   Pt is a 23 year old female G1P0101 at [redacted]w[redacted]d was admitted to the hospital 11/14/2023 for induction of labor. Patient had a labor course significant for pPROM at 34 weeks. The patient went for cesarean section due to fetal intolerance of labor. Patient had a postpartum course complicated by returning to the OR for incision repair.  Pregnancy has also been complicated by extensive mental health history including bipolar affective disorder, substance abuse (UDS positive for cocaine, THC, benzos), Chlamydia infection (recent treatment, TOC positive on admission), she was admitted to the [facility] on 11/24/23; PMH significant for bipolar disorder, borderline traits, relapse on polysubstance use, wrote suicide letters addressing her family members and to the unborn child, found by accident by patient's family raised safety concerns.     Clinical Impressions Chart reviewed, pt greeted in bed, labile throughout and sates I need to stop crying so I can get better. She endorses she wants to see her baby. PTA pt is indep in ADL/IADL, is s/p recent c section. She reports discomfort while amb in c section scar/pulling. She says her emotional pain and physical pain feel the same. Educated on energy conservation techniques, pain management, continued mobility/PO intake for increased ease of BMs. Discussed pumping with pt appearing to be aware of how to pump, she states she is supposed to pump every 3-4 hrs. She is interested in lactation following up w her, I called and left a voicemail on their line. Pt has fair carry over of education, will benefit from further OT to facilitate optimal ADL participation. Pt is left as received, all needs met. OT will follow.     If plan is discharge home, recommend the following:   Supervision due to cognitive status      Functional Status Assessment   Patient has had a recent decline in their functional status and demonstrates the ability to make significant improvements in function in a reasonable and predictable amount of time.     Equipment Recommendations   None recommended by OT;Other (comment) (abdominal binder for core support s/p c section)     Recommendations for Other Services         Precautions/Restrictions   Precautions Precautions: None     Mobility Bed Mobility Overal bed mobility: Independent                  Transfers Overall transfer level: Independent                        Balance Overall balance assessment: Independent                                         ADL either performed or assessed with clinical judgement   ADL Overall ADL's : Modified independent                                       General ADL Comments: supervision-MOD I in ADLs, requires education on energy conservation techniques/ compensatory strategies with lifting restrictions; she is currently holding pumps to her breasts to pump, reports limited output but states it is important to her to try to pump  Vision Patient Visual Report: No change from baseline       Perception         Praxis         Pertinent Vitals/Pain Pain Assessment Pain Assessment: 0-10 Pain Score: 6  Pain Location: abdomen/pulling/c section pain Pain Intervention(s): Monitored during session, Repositioned, Limited activity within patient's tolerance     Extremity/Trunk Assessment Upper Extremity Assessment Upper Extremity Assessment: Overall WFL for tasks assessed   Lower Extremity Assessment Lower Extremity Assessment: Overall WFL for tasks assessed       Communication Communication Communication: No apparent difficulties   Cognition Arousal: Alert Behavior During Therapy: Lability Cognition: History of cognitive impairments, Cognition  impaired           Executive functioning impairment (select all impairments): Reasoning, Problem solving                   Following commands: Intact       Cueing  General Comments   Cueing Techniques: Verbal cues      Exercises Other Exercises Other Exercises: edu re: role of OT, role of rehab, ADL completion with compensatory techqniues, continue to mobilize/po intake for increased ease of BMs   Shoulder Instructions      Home Living Family/patient expects to be discharged to:: Unsure (reports plans to dc to [facility]house, but she does not know what the plan is)                                        Prior Functioning/Environment Prior Level of Function : Independent/Modified Independent                    OT Problem List: Decreased activity tolerance;Decreased knowledge of use of DME or AE;Decreased cognition   OT Treatment/Interventions: Self-care/ADL training;DME and/or AE instruction;Therapeutic activities;Balance training;Therapeutic exercise;Patient/family education      OT Goals(Current goals can be found in the care plan section)   Acute Rehab OT Goals Patient Stated Goal: figure out what is going on OT Goal Formulation: With patient Time For Goal Achievement: 12/09/23 Potential to Achieve Goals: Good ADL Goals Additional ADL Goal #1: Pt will perform ADLs with MOD I with no vcs required for body mechanics/energy conservation techniques   OT Frequency:  Min 2X/week    Co-evaluation              AM-PAC OT 6 Clicks Daily Activity     Outcome Measure Help from another person eating meals?: None Help from another person taking care of personal grooming?: None Help from another person toileting, which includes using toliet, bedpan, or urinal?: None Help from another person bathing (including washing, rinsing, drying)?: None Help from another person to put on and taking off regular upper body clothing?: None Help  from another person to put on and taking off regular lower body clothing?: None 6 Click Score: 24   End of Session Nurse Communication: Mobility status;Patient requests pain meds  Activity Tolerance: Patient tolerated treatment well Patient left: in bed  OT Visit Diagnosis: Unsteadiness on feet (R26.81)                Time: 8596-8573 OT Time Calculation (min): 23 min Charges:  OT General Charges $OT Visit: 1 Visit OT Evaluation $OT Eval Moderate Complexity: 1 Mod Therisa Sheffield, OTD OTR/L  11/25/23, 4:14 PM

## 2023-11-25 NOTE — Plan of Care (Signed)
  Problem: Coping: Goal: Ability to demonstrate self-control will improve Outcome: Progressing   Problem: Health Behavior/Discharge Planning: Goal: Identification of resources available to assist in meeting health care needs will improve Outcome: Progressing Goal: Compliance with treatment plan for underlying cause of condition will improve Outcome: Progressing   Problem: Safety: Goal: Periods of time without injury will increase Outcome: Progressing

## 2023-11-25 NOTE — Progress Notes (Signed)
 [facility] MD Progress Note  11/25/2023 1:47 PM Deborah Mckee  MRN:  [id number]  Deborah Mckee is a 23 yo G1P0 female presenting at [redacted]w[redacted]d IUP with concerns for PPROM 2 days ago without preterm labor. Pregnancy has also been complicated by extensive mental health history including bipolar affective disorder, substance abuse (UDS positive for cocaine, THC, benzos), Chlamydia infection (recent treatment, TOC positive on admission). Plan is currently for monitoring, possible induction on/after 8/1 ([redacted]w[redacted]d). Patient reported one time use of cocaine following break up with bf they wrote suicide notes as they feared they were going to lose custody of the child Patient is admitted to adult psych unit with Q15 min safety monitoring. Multidisciplinary team approach is offered. Medication management; group/milieu therapy is offered.   Subjective:  Chart reviewed, case discussed in multidisciplinary meeting, patient seen during rounds.   Patient seen today for follow-up psychiatric evaluation. She greets this provider pleasantly and immediately apologizes for her behavior during the first encounter, acknowledging she had been very upset about the plan for inpatient psychiatric admission following delivery. She reports improved insight, stating that she understands the need to address her emotional instability and safety. She reflects on her prior suicidal ideation before the birth, sharing that she had every intention of ending her life but: "Since the birth of my son, my outlook has changed." She now expresses a strong investment in being a mother and no longer wishes to die. Denies current suicidal or homicidal ideation Denies hallucinations, paranoia, or delusions Reports her mood is still labile but has improved overall Appears tearful at times during the interview Remains hopeful and optimistic about CPS involvement  Sleep: Good  Appetite:  Good  Past Psychiatric History: see h&P Family History: History  reviewed. No pertinent family history. Social History:  Social History   Substance and Sexual Activity  Alcohol Use No     Social History   Substance and Sexual Activity  Drug Use Yes   Types: Marijuana, Cocaine   Comment: social    Social History   Socioeconomic History   Marital status: Single    Spouse name: Not on file   Number of children: Not on file   Years of education: Not on file   Highest education level: Not on file  Occupational History   Not on file  Tobacco Use   Smoking status: Every Day    Types: E-cigarettes   Smokeless tobacco: Never  Vaping Use   Vaping status: Every Day   Substances: Nicotine , Synthetic cannabinoids  Substance and Sexual Activity   Alcohol use: No   Drug use: Yes    Types: Marijuana, Cocaine    Comment: social   Sexual activity: Yes    Birth control/protection: None  Other Topics Concern   Not on file  Social History Narrative   Not on file   Social Drivers of Health   Financial Resource Strain: Low Risk  (05/05/2023)   Received from [facility]   Overall Financial Resource Strain (CARDIA)    Difficulty of Paying Living Expenses: Not hard at all  Food Insecurity: Food Insecurity Present (11/23/2023)   Hunger Vital Sign    Worried About Running Out of Food in the Last Year: Sometimes true    Ran Out of Food in the Last Year: Often true  Transportation Needs: No Transportation Needs (11/23/2023)   PRAPARE - Administrator, Civil Service (Medical): No    Lack of Transportation (Non-Medical): No  Physical Activity: Not on  file  Stress: Not on file  Social Connections: Not on file   Past Medical History:  Past Medical History:  Diagnosis Date   Anxiety    Chlamydia    Depression    History of cocaine abuse (HCC)    Vaccine for human papilloma virus (HPV) types 6, 11, 16, and 18 administered    Vision abnormalities    wears glasses    Past Surgical History:  Procedure Laterality Date   INGUINAL HERNIA  PEDIATRIC WITH LAPAROSCOPIC EXAM Left 04/22/2017   Procedure: INGUINAL HERNIA PEDIATRIC WITH LAP LOOK OF PELVIC FLOOR;  Surgeon: Claudius Kaplan, MD;  Location: [facility];  Service: Pediatrics;  Laterality: Left;   INGUINAL HERNIA REPAIR Right 2008   LAPAROTOMY N/A 11/21/2023   Procedure: Revision of C Section Incision;  Surgeon: Verdon Keen, MD;  Location: [facility];  Service: Gynecology;  Laterality: N/A;    Current Medications: Current Facility-Administered Medications  Medication Dose Route Frequency Provider Last Rate Last Admin   acetaminophen  (TYLENOL ) tablet 1,000 mg  1,000 mg Oral Q6H Jadapalle, Sree, MD   1,000 mg at 11/25/23 0801   acetaminophen  (TYLENOL ) tablet 650 mg  650 mg Oral Q6H PRN Donnelly Mellow, MD       alum & mag hydroxide-simeth (MAALOX/MYLANTA) 200-200-20 MG/5ML suspension 30 mL  30 mL Oral Q4H PRN Jadapalle, Sree, MD       bisacodyl  (DULCOLAX) suppository 10 mg  10 mg Rectal Daily PRN Jadapalle, Sree, MD       cephALEXin  (KEFLEX ) capsule 500 mg  500 mg Oral Q12H Jadapalle, Sree, MD   500 mg at 11/25/23 0801   coconut oil  1 Application Topical PRN Jadapalle, Sree, MD       witch hazel-glycerin  (TUCKS) pad 1 Application  1 Application Topical PRN Jadapalle, Sree, MD       And   dibucaine (NUPERCAINAL) 1 % rectal ointment 1 Application  1 Application Rectal PRN Jadapalle, Sree, MD       diphenhydrAMINE  (BENADRYL ) capsule 25 mg  25 mg Oral Q6H PRN Jadapalle, Sree, MD       haloperidol  (HALDOL ) tablet 5 mg  5 mg Oral TID PRN Jadapalle, Sree, MD   5 mg at 11/24/23 1157   And   diphenhydrAMINE  (BENADRYL ) capsule 50 mg  50 mg Oral TID PRN Jadapalle, Sree, MD   50 mg at 11/24/23 1157   haloperidol  lactate (HALDOL ) injection 5 mg  5 mg Intramuscular TID PRN Jadapalle, Sree, MD       And   diphenhydrAMINE  (BENADRYL ) injection 50 mg  50 mg Intramuscular TID PRN Jadapalle, Sree, MD       And   LORazepam  (ATIVAN ) injection 2 mg  2 mg Intramuscular TID PRN  Jadapalle, Sree, MD       haloperidol  lactate (HALDOL ) injection 10 mg  10 mg Intramuscular TID PRN Jadapalle, Sree, MD       And   diphenhydrAMINE  (BENADRYL ) injection 50 mg  50 mg Intramuscular TID PRN Jadapalle, Sree, MD       And   LORazepam  (ATIVAN ) injection 2 mg  2 mg Intramuscular TID PRN Jadapalle, Sree, MD       ferrous sulfate  tablet 325 mg  325 mg Oral BID WC Jadapalle, Sree, MD   325 mg at 11/25/23 0802   gabapentin  (NEURONTIN ) capsule 300 mg  300 mg Oral QHS Jadapalle, Sree, MD   300 mg at 11/24/23 2131   hydrOXYzine  (ATARAX ) tablet 25 mg  25  mg Oral Q6H PRN Jadapalle, Sree, MD       ibuprofen  (ADVIL ) tablet 600 mg  600 mg Oral Q6H Jadapalle, Sree, MD   600 mg at 11/25/23 0802   lithium  carbonate (LITHOBID ) ER tablet 600 mg  600 mg Oral Daily Jadapalle, Sree, MD   600 mg at 11/25/23 9197   lurasidone  (LATUDA ) tablet 20 mg  20 mg Oral Q supper Jadapalle, Sree, MD   20 mg at 11/24/23 1748   magnesium  hydroxide (MILK OF MAGNESIA) suspension 30 mL  30 mL Oral Daily PRN Donnelly Mellow, MD       ondansetron  (ZOFRAN ) tablet 4 mg  4 mg Oral Q8H PRN Jadapalle, Sree, MD       oxyCODONE  (Oxy IR/ROXICODONE ) immediate release tablet 5 mg  5 mg Oral Q6H PRN Jadapalle, Sree, MD   5 mg at 11/25/23 0802   prenatal multivitamin tablet 1 tablet  1 tablet Oral Q1200 Jadapalle, Sree, MD   1 tablet at 11/25/23 1210   senna-docusate (Senokot-S) tablet 2 tablet  2 tablet Oral Q24H Jadapalle, Sree, MD   2 tablet at 11/24/23 2132   simethicone  (MYLICON) chewable tablet 80 mg  80 mg Oral TID PC Jadapalle, Sree, MD   80 mg at 11/25/23 1210   simethicone  (MYLICON) chewable tablet 80 mg  80 mg Oral PRN Jadapalle, Sree, MD        Lab Results: No results found for this or any previous visit (from the past 48 hours).  Blood Alcohol level:  Lab Results  Component Value Date   ETH 29 (H) 09/12/2022    Metabolic Disorder Labs: Lab Results  Component Value Date   HGBA1C 4.6 (L) 02/12/2021   MPG 85.32  02/12/2021   No results found for: PROLACTIN Lab Results  Component Value Date   CHOL 140 02/12/2021   TRIG 27 02/12/2021   HDL 59 02/12/2021   CHOLHDL 2.4 02/12/2021   VLDL 5 02/12/2021   LDLCALC 76 02/12/2021      Psychiatric Specialty Exam:  Presentation  General Appearance:  Appropriate for Environment; Casual  Eye Contact: Fair  Speech: Clear and Coherent  Speech Volume: Normal    Mood and Affect  Mood: Anxious; Depressed; Dysphoric; Irritable  Affect: Labile   Thought Process  Thought Processes: Coherent  Descriptions of Associations:Intact  Orientation:Full (Time, Place and Person)  Thought Content:Illogical  Hallucinations:Hallucinations: None  Ideas of Reference:None  Suicidal Thoughts:Suicidal Thoughts: No  Homicidal Thoughts:Homicidal Thoughts: No   Sensorium  Memory: Immediate Fair; Recent Fair; Remote Fair  Judgment: Fair  Insight: Fair   Art therapist  Concentration: Fair  Attention Span: Fair  Recall: [employer] of Knowledge: Fair  Language: Fair   Psychomotor Activity  Psychomotor Activity: Psychomotor Activity: Normal  Musculoskeletal: Strength & Muscle Tone: within normal limits Gait & Station: normal Assets  Assets: Manufacturing systems engineer; Resilience; Social Support    Physical Exam: Physical Exam ROS Blood pressure (!) 97/46, pulse 62, temperature 97.7 F (36.5 C), resp. rate 20, height 5' 6 (1.676 m), weight 61.2 kg, last menstrual period 03/26/2023, SpO2 100%, unknown if currently breastfeeding. Body mass index is 21.78 kg/m.  Diagnosis: Principal Problem:   MDD (major depressive disorder), recurrent severe, without psychosis (HCC)   PLAN: Safety and Monitoring:  -- Voluntary admission to inpatient psychiatric unit for safety, stabilization and treatment  -- Daily contact with patient to assess and evaluate symptoms and progress in treatment  -- Patient's case to be  discussed in multi-disciplinary team  meeting  -- Observation Level : q15 minute checks  -- Vital signs:  q12 hours  -- Precautions: suicide, elopement, and assault -- Encouraged patient to participate in unit milieu and in scheduled group therapies  2. Psychiatric Diagnoses and Treatment:  Patient with history of bipolar disorder, borderline traits, recently delivered baby premature at 33 weeks, relapse on polysubstance use, wrote suicide letters addressing her family members and to the unborn child, found by accident by patient's family raised safety concerns.  Patient displaying extreme mood lability, crying nonstop.  Patient is initiated on lithium  and Latuda  needs to be monitored closely for safety concerns.  Diagnosis: Bipolar Disorder Borderline Traits  Medications:  Lithium  600 mg daily started Wednesday 11/24/23, plan to titrated up and check the lithium  level Latuda  20 mg daily-plan to titrated the dose Patient does not want to breast-feed at this time given the initiation of lithium  and also she reports not being comfortable pumping breastmilk in the hospital. Discussed possible bubble packing Lithium  or mom assist with medication safety related to overdose potential of medication.    -- The risks/benefits/side-effects/alternatives to this medication were discussed in detail with the patient and time was given for questions. The patient consents to medication trial.                -- Metabolic profile and EKG monitoring obtained while on an atypical antipsychotic (BMI: Lipid Panel: HbgA1c: QTc:)              -- Encouraged patient to participate in unit milieu and in scheduled group therapies                    3. Medical Issues Being Addressed: assist pt with post partum discharge instructions.     4. Discharge Planning:   -- Social work and case management to assist with discharge planning and identification of hospital follow-up needs prior to discharge  -- Estimated LOS: 3-4  days  Hoy CHRISTELLA Pinal, NP 11/25/2023, 1:47 PM

## 2023-11-25 NOTE — Group Note (Signed)
 Date:  11/25/2023 Time:  8:31 PM  Group Topic/Focus:  Wrap-Up Group:   The focus of this group is to help patients review their daily goal of treatment and discuss progress on daily workbooks.    Participation Level:  Did Not Attend  Participation Quality:  none  Affect:  none  Cognitive:  none  Insight: None  Engagement in Group:  none  Modes of Intervention:  none  Additional Comments:  none   Kerri Katz 11/25/2023, 8:31 PM

## 2023-11-25 NOTE — Group Note (Signed)
 Date:  11/25/2023 Time:  6:28 PM  Group Topic/Focus:  Goals Group:   The focus of this group is to help patients establish daily goals to achieve during treatment and discuss how the patient can incorporate goal setting into their daily lives to aide in recovery.    Participation Level:  Active  Participation Quality:  Appropriate  Affect:  Appropriate  Cognitive:  Appropriate  Insight: Appropriate  Engagement in Group:  Engaged  Modes of Intervention:  Discussion and Education  Additional Comments:    Deitra Caron [place] 11/25/2023, 6:28 PM

## 2023-11-25 NOTE — Group Note (Deleted)
 LCSW Group Therapy Note  Group Date: 11/25/2023 Start Time: 1315 End Time: 1400   Type of Therapy and Topic:  Group Therapy: Positive Affirmations  Participation Level:  {BHH PARTICIPATION OZCZO:[id number]}   Description of Group:   This group addressed positive affirmation towards self and others.  Patients went around the room and identified two positive things about themselves and two positive things about a peer in the room.  Patients reflected on how it felt to share something positive with others, to identify positive things about themselves, and to hear positive things from others/ Patients were encouraged to have a daily reflection of positive characteristics or circumstances.   Therapeutic Goals: 1. Patients will verbalize two of their positive qualities 2. Patients will demonstrate empathy for others by stating two positive qualities about a peer in the group 3. Patients will verbalize their feelings when voicing positive self affirmations and when voicing positive affirmations of others 4. Patients will discuss the potential positive impact on their wellness/recovery of focusing on positive traits of self and others.  Summary of Patient Progress:  *** actively engaged in the discussion and . S*** was able***or not able to identify positive affirmations about ***self as well as other group members. Patient demonstrated *** insight into the subject matter, was respectful of peers, participated throughout the entire session.  Therapeutic Modalities:   Cognitive Behavioral Therapy Motivational Interviewing    Lum JONETTA Croft, [place] 11/25/2023  2:09 PM

## 2023-11-26 MED ORDER — DOCUSATE SODIUM 100 MG PO CAPS
100.0000 mg | ORAL_CAPSULE | Freq: Two times a day (BID) | ORAL | Status: DC
  Administered 2023-11-26 – 2023-11-30 (×11): 100 mg via ORAL
  Filled 2023-11-26 (×8): qty 1

## 2023-11-26 NOTE — Progress Notes (Signed)
   11/26/23 1100  Psych Admission Type (Psych Patients Only)  Admission Status Voluntary  Psychosocial Assessment  Patient Complaints Anxiety;Crying spells;Depression;Sleep disturbance (patient states that she was up throughout the night, reporting that she got Trazodone  and that gives me really bad dreams. I'm trying to escape that. I need peace.)  Eye Contact Fair  Facial Expression Sad  Affect Anxious;Depressed;Sad (patient states everyday I wake up and I'm here. I'm ready to go home.)  Speech Logical/coherent  Interaction Assertive  Motor Activity Slow  Appearance/Hygiene Unremarkable;Layered clothes  Behavior Characteristics Cooperative;Appropriate to situation  Mood Depressed;Anxious;Pleasant  Aggressive Behavior  Effect No apparent injury  Thought Process  Coherency WDL  Content WDL  Delusions None reported or observed  Perception WDL  Hallucination None reported or observed  Judgment WDL  Confusion None  Danger to Self  Current suicidal ideation? Denies  Self-Injurious Behavior No self-injurious ideation or behavior indicators observed or expressed   Agreement Not to Harm Self Yes  Description of Agreement Verbal  Danger to Others  Danger to Others None reported or observed   Patient had no stated goals to voice to this writer for today.

## 2023-11-26 NOTE — Progress Notes (Signed)
 Occupational Therapy Treatment Patient Details Name: Deborah Mckee MRN: [id number] DOB: 31-Jan-2001 Today's Date: 11/26/2023   History of present illness Pt is a 23 year old female G1P0101 at [redacted]w[redacted]d was admitted to the hospital 11/14/2023 for induction of labor. Patient had a labor course significant for pPROM at 34 weeks. The patient went for cesarean section due to fetal intolerance of labor. Patient had a postpartum course complicated by returning to the OR for incision repair.  Pregnancy has also been complicated by extensive mental health history including bipolar affective disorder, substance abuse (UDS positive for cocaine, THC, benzos), Chlamydia infection (recent treatment, TOC positive on admission), she was admitted to the [facility] on 11/24/23; PMH significant for bipolar disorder, borderline traits, relapse on polysubstance use, wrote suicide letters addressing her family members and to the unborn child, found by accident by patient's family raised safety concerns.   OT comments  Chart reviewed, in to see patient and she reports she performing ADLs with MOD I, increased difficulties with BM but reports plans to increase meds. Discussed pain management s/p c section -pt reports she will ask for Tylenol  if needed. Incision does not feel good per her report. She reports breastfeeding is not a goal for her right now. Encouraged frequent amb as tolerated. Pt is left as received, OT will follow.       If plan is discharge home, recommend the following:  Supervision due to cognitive status   Equipment Recommendations  None recommended by OT;Other (comment) (PRN abdominal binder s/p c section)    Recommendations for Other Services      Precautions / Restrictions         Mobility Bed Mobility Overal bed mobility: Independent                  Transfers Overall transfer level: Independent                       Balance Overall balance assessment: Independent                                          ADL either performed or assessed with clinical judgement   ADL Overall ADL's : Modified independent                                       General ADL Comments: reports MOD I with ADLs- continued difficulties with BMs;    Extremity/Trunk Assessment              Vision       Perception     Praxis     Communication     Cognition                                              Cueing      Exercises Other Exercises Other Exercises: edu re: continued mobilization for ease of BM    Shoulder Instructions       General Comments      Pertinent Vitals/ Pain          Home Living  Prior Functioning/Environment              Frequency  Min 2X/week        Progress Toward Goals  OT Goals(current goals can now be found in the care plan section)  Progress towards OT goals: Progressing toward goals  Acute Rehab OT Goals Time For Goal Achievement: 12/09/23  Plan      Co-evaluation                 AM-PAC OT 6 Clicks Daily Activity     Outcome Measure   Help from another person eating meals?: None Help from another person taking care of personal grooming?: None Help from another person toileting, which includes using toliet, bedpan, or urinal?: None Help from another person bathing (including washing, rinsing, drying)?: None Help from another person to put on and taking off regular upper body clothing?: None Help from another person to put on and taking off regular lower body clothing?: None 6 Click Score: 24    End of Session    OT Visit Diagnosis: Other symptoms and signs involving the nervous system (R29.898)   Activity Tolerance Patient tolerated treatment well   Patient Left in bed   Nurse Communication          Time: 8592-8582 OT Time Calculation (min): 10 min  Charges: OT General Charges $OT  Visit: 1 Visit  Therisa Sheffield, OTD OTR/L  11/26/23, 3:16 PM

## 2023-11-26 NOTE — Group Note (Signed)
 Date:  11/26/2023 Time:  8:47 PM  Group Topic/Focus:  Wrap-Up Group:   The focus of this group is to help patients review their daily goal of treatment and discuss progress on daily workbooks.    Participation Level:  Did Not Attend  Deborah Mckee 11/26/2023, 8:47 PM

## 2023-11-26 NOTE — Plan of Care (Signed)

## 2023-11-26 NOTE — Group Note (Signed)
 Date:  11/26/2023 Time:  11:30 AM  Group Topic/Focus:  Goals Group:   The focus of this group is to help patients establish daily goals to achieve during treatment and discuss how the patient can incorporate goal setting into their daily lives to aide in recovery.  Participation Level:  Did Not Attend  Deborah Mckee A Deborah Mckee 11/26/2023, 11:30 AM

## 2023-11-26 NOTE — Progress Notes (Signed)
 [facility] MD Progress Note  11/26/2023 11:16 AM Deborah Mckee  MRN:  [id number]  Deborah Mckee is a 23 yo G1P0 female presenting at [redacted]w[redacted]d IUP with concerns for PPROM 2 days ago without preterm labor. Pregnancy has also been complicated by extensive mental health history including bipolar affective disorder, substance abuse (UDS positive for cocaine, THC, benzos), Chlamydia infection (recent treatment, TOC positive on admission). Plan is currently for monitoring, possible induction on/after 8/1 ([redacted]w[redacted]d). Patient reported one time use of cocaine following break up with bf they wrote suicide notes as they feared they were going to lose custody of the child Patient is admitted to adult psych unit with Q15 min safety monitoring. Multidisciplinary team approach is offered. Medication management; group/milieu therapy is offered.   Subjective:  Chart reviewed, case discussed in multidisciplinary meeting, patient seen during rounds.   Patient seen today for follow-up psychiatric evaluation. She greets this provider pleasantly and is engaged during interview. She reports that she has now seen her baby and describes improved mood overall, though she continues to experience emotional outbursts and crying spells. She denies suicidal or homicidal ideation. She denies hallucinations, paranoia, or delusions. She describes the emotional lability as not aggressive or inappropriate given her current stressors. She reports having been stabilized in the past on lithium  600 mg per day.  Sleep: Good  Appetite:  Good  Past Psychiatric History: see h&P Family History: History reviewed. No pertinent family history. Social History:  Social History   Substance and Sexual Activity  Alcohol Use No     Social History   Substance and Sexual Activity  Drug Use Yes   Types: Marijuana, Cocaine   Comment: social    Social History   Socioeconomic History   Marital status: Single    Spouse name: Not on file   Number of  children: Not on file   Years of education: Not on file   Highest education level: Not on file  Occupational History   Not on file  Tobacco Use   Smoking status: Every Day    Types: E-cigarettes   Smokeless tobacco: Never  Vaping Use   Vaping status: Every Day   Substances: Nicotine , Synthetic cannabinoids  Substance and Sexual Activity   Alcohol use: No   Drug use: Yes    Types: Marijuana, Cocaine    Comment: social   Sexual activity: Yes    Birth control/protection: None  Other Topics Concern   Not on file  Social History Narrative   Not on file   Social Drivers of Health   Financial Resource Strain: Low Risk  (05/05/2023)   Received from [facility]   Overall Financial Resource Strain (CARDIA)    Difficulty of Paying Living Expenses: Not hard at all  Food Insecurity: Food Insecurity Present (11/23/2023)   Hunger Vital Sign    Worried About Running Out of Food in the Last Year: Sometimes true    Ran Out of Food in the Last Year: Often true  Transportation Needs: No Transportation Needs (11/23/2023)   PRAPARE - Administrator, Civil Service (Medical): No    Lack of Transportation (Non-Medical): No  Physical Activity: Not on file  Stress: Not on file  Social Connections: Not on file   Past Medical History:  Past Medical History:  Diagnosis Date   Anxiety    Chlamydia    Depression    History of cocaine abuse (HCC)    Vaccine for human papilloma virus (HPV) types 6, 11,  16, and 18 administered    Vision abnormalities    wears glasses    Past Surgical History:  Procedure Laterality Date   INGUINAL HERNIA PEDIATRIC WITH LAPAROSCOPIC EXAM Left 04/22/2017   Procedure: INGUINAL HERNIA PEDIATRIC WITH LAP LOOK OF PELVIC FLOOR;  Surgeon: Claudius Kaplan, MD;  Location: [facility];  Service: Pediatrics;  Laterality: Left;   INGUINAL HERNIA REPAIR Right 2008   LAPAROTOMY N/A 11/21/2023   Procedure: Revision of C Section Incision;  Surgeon: Verdon Keen, MD;  Location: [facility];  Service: Gynecology;  Laterality: N/A;    Current Medications: Current Facility-Administered Medications  Medication Dose Route Frequency Provider Last Rate Last Admin   acetaminophen  (TYLENOL ) tablet 1,000 mg  1,000 mg Oral Q6H Jadapalle, Sree, MD   1,000 mg at 11/26/23 0846   acetaminophen  (TYLENOL ) tablet 650 mg  650 mg Oral Q6H PRN Jadapalle, Sree, MD       alum & mag hydroxide-simeth (MAALOX/MYLANTA) 200-200-20 MG/5ML suspension 30 mL  30 mL Oral Q4H PRN Jadapalle, Sree, MD       bisacodyl  (DULCOLAX) suppository 10 mg  10 mg Rectal Daily PRN Jadapalle, Sree, MD       cephALEXin  (KEFLEX ) capsule 500 mg  500 mg Oral Q12H Jadapalle, Sree, MD   500 mg at 11/26/23 0846   coconut oil  1 Application Topical PRN Jadapalle, Sree, MD       witch hazel-glycerin  (TUCKS) pad 1 Application  1 Application Topical PRN Jadapalle, Sree, MD       And   dibucaine (NUPERCAINAL) 1 % rectal ointment 1 Application  1 Application Rectal PRN Jadapalle, Sree, MD       diphenhydrAMINE  (BENADRYL ) capsule 25 mg  25 mg Oral Q6H PRN Jadapalle, Sree, MD       haloperidol  (HALDOL ) tablet 5 mg  5 mg Oral TID PRN Jadapalle, Sree, MD   5 mg at 11/24/23 1157   And   diphenhydrAMINE  (BENADRYL ) capsule 50 mg  50 mg Oral TID PRN Jadapalle, Sree, MD   50 mg at 11/24/23 1157   haloperidol  lactate (HALDOL ) injection 5 mg  5 mg Intramuscular TID PRN Jadapalle, Sree, MD       And   diphenhydrAMINE  (BENADRYL ) injection 50 mg  50 mg Intramuscular TID PRN Jadapalle, Sree, MD       And   LORazepam  (ATIVAN ) injection 2 mg  2 mg Intramuscular TID PRN Jadapalle, Sree, MD       haloperidol  lactate (HALDOL ) injection 10 mg  10 mg Intramuscular TID PRN Jadapalle, Sree, MD       And   diphenhydrAMINE  (BENADRYL ) injection 50 mg  50 mg Intramuscular TID PRN Jadapalle, Sree, MD       And   LORazepam  (ATIVAN ) injection 2 mg  2 mg Intramuscular TID PRN Jadapalle, Sree, MD       docusate sodium  (COLACE)  capsule 100 mg  100 mg Oral Daily Cleotilde Hoy HERO, NP   100 mg at 11/26/23 0846   ferrous sulfate  tablet 325 mg  325 mg Oral BID WC Jadapalle, Sree, MD   325 mg at 11/26/23 0846   gabapentin  (NEURONTIN ) capsule 300 mg  300 mg Oral QHS Jadapalle, Sree, MD   300 mg at 11/25/23 2104   hydrOXYzine  (ATARAX ) tablet 25 mg  25 mg Oral Q6H PRN Jadapalle, Sree, MD   25 mg at 11/25/23 1957   ibuprofen  (ADVIL ) tablet 600 mg  600 mg Oral Q6H Jadapalle, Sree, MD  600 mg at 11/26/23 0845   lithium  carbonate (LITHOBID ) ER tablet 600 mg  600 mg Oral Daily Jadapalle, Sree, MD   600 mg at 11/26/23 0845   lurasidone  (LATUDA ) tablet 20 mg  20 mg Oral Q supper Jadapalle, Sree, MD   20 mg at 11/25/23 1706   magnesium  hydroxide (MILK OF MAGNESIA) suspension 30 mL  30 mL Oral Daily PRN Donnelly Mellow, MD       ondansetron  (ZOFRAN ) tablet 4 mg  4 mg Oral Q8H PRN Jadapalle, Sree, MD       oxyCODONE  (Oxy IR/ROXICODONE ) immediate release tablet 5 mg  5 mg Oral Q6H PRN Jadapalle, Sree, MD   5 mg at 11/25/23 1437   prenatal multivitamin tablet 1 tablet  1 tablet Oral Q1200 Jadapalle, Sree, MD   1 tablet at 11/25/23 1210   senna-docusate (Senokot-S) tablet 2 tablet  2 tablet Oral Q24H Jadapalle, Sree, MD   2 tablet at 11/25/23 2105   simethicone  (MYLICON) chewable tablet 80 mg  80 mg Oral TID PC Jadapalle, Sree, MD   80 mg at 11/26/23 0845   simethicone  (MYLICON) chewable tablet 80 mg  80 mg Oral PRN Jadapalle, Sree, MD        Lab Results: No results found for this or any previous visit (from the past 48 hours).  Blood Alcohol level:  Lab Results  Component Value Date   ETH 29 (H) 09/12/2022    Metabolic Disorder Labs: Lab Results  Component Value Date   HGBA1C 4.6 (L) 02/12/2021   MPG 85.32 02/12/2021   No results found for: PROLACTIN Lab Results  Component Value Date   CHOL 140 02/12/2021   TRIG 27 02/12/2021   HDL 59 02/12/2021   CHOLHDL 2.4 02/12/2021   VLDL 5 02/12/2021   LDLCALC 76 02/12/2021       Psychiatric Specialty Exam: Appearance: Groomed, dressed for the day Eye Contact: Good Speech: Normal rate, rhythm, tone Mood: Improved, still labile Affect: Tearful but congruent Thought Process: Linear, goal-directed Thought Content: Reality-based, no psychosis Orientation: Fully oriented Insight: Improving Judgment: Fair Sleep/Appetite: Not addressed today Memory / Concentration / Recall / Fund of Knowledge / Language: WNL Psychomotor: Within normal limits  Musculoskeletal: Strength & Muscle Tone: within normal limits Gait & Station: normal  Assets  Assets: Manufacturing systems engineer; Resilience; Social Support    Physical Exam: Physical Exam ROS Blood pressure 108/62, pulse 62, temperature 98.1 F (36.7 C), resp. rate 20, height 5' 6 (1.676 m), weight 61.2 kg, last menstrual period 03/26/2023, SpO2 100%, unknown if currently breastfeeding. Body mass index is 21.78 kg/m.  Diagnosis: Principal Problem:   MDD (major depressive disorder), recurrent severe, without psychosis (HCC)   PLAN: Safety and Monitoring:  -- Voluntary admission to inpatient psychiatric unit for safety, stabilization and treatment  -- Daily contact with patient to assess and evaluate symptoms and progress in treatment  -- Patient's case to be discussed in multi-disciplinary team meeting  -- Observation Level : q15 minute checks  -- Vital signs:  q12 hours  -- Precautions: suicide, elopement, and assault -- Encouraged patient to participate in unit milieu and in scheduled group therapies  2. Psychiatric Diagnoses and Treatment:  Patient with history of bipolar disorder, borderline traits, recently delivered baby premature at 33 weeks, relapse on polysubstance use, wrote suicide letters addressing her family members and to the unborn child, found by accident by patient's family raised safety concerns.    Patient continues to show improving insight and mood stabilization, particularly  after  seeing her newborn. While she remains emotionally labile, these symptoms are contextually appropriate and non-aggressive. She maintains hopeful outlook regarding CPS involvement and demonstrates protective maternal motivation. She reports past benefit from lithium  and expresses openness to medication reassessment.  Patient continues to require this acute inpatient psychiatric setting for safety, medication management, and symptom stabilization to address need for continued treatment.  Diagnosis: Bipolar Disorder Borderline Traits  Medications:  Lithium  600 mg daily started Wednesday 11/24/23, plan to titrated up and check the lithium  level Latuda  20 mg daily-plan to titrated the dose Patient does not want to breast-feed at this time given the initiation of lithium  and also she reports not being comfortable pumping breastmilk in the hospital. Discussed possible bubble packing Lithium  or mom assist with medication safety related to overdose potential of medication.    -- The risks/benefits/side-effects/alternatives to this medication were discussed in detail with the patient and time was given for questions. The patient consents to medication trial.                -- Metabolic profile and EKG monitoring obtained while on an atypical antipsychotic (BMI: Lipid Panel: HbgA1c: QTc:)              -- Encouraged patient to participate in unit milieu and in scheduled group therapies                    3. Medical Issues Being Addressed: assist pt with post partum discharge instructions.     4. Discharge Planning:   -- Social work and case management to assist with discharge planning and identification of hospital follow-up needs prior to discharge  -- Estimated LOS: 3-4 days  Hoy CHRISTELLA Pinal, NP 11/26/2023, 11:16 AM

## 2023-11-26 NOTE — Group Note (Signed)
 Recreation Therapy Group Note   Group Topic:Leisure Education  Group Date: 11/26/2023 Start Time: 1040 End Time: 1140 Facilitators: Celestia Jeoffrey BRAVO, LRT, CTRS Location: Craft Room  Group Description: Leisure. Patients were given the option to choose from singing karaoke, coloring mandalas, using oil pastels, journaling, painting or playing with play-doh. LRT and pts discussed the meaning of leisure, the importance of participating in leisure during their free time/when they're outside of the hospital, as well as how our leisure interests can also serve as coping skills.   Goal Area(s) Addressed:  Patient will identify a current leisure interest.  Patient will learn the definition of "leisure". Patient will practice making a positive decision. Patient will have the opportunity to try a new leisure activity. Patient will communicate with peers and LRT.    Affect/Mood: Appropriate   Participation Level: Active and Engaged   Participation Quality: Independent   Behavior: Appropriate, Calm, and Cooperative   Speech/Thought Process: Coherent   Insight: Fair   Judgement: Fair    Modes of Intervention: Activity, Education, Exploration, and Music   Patient Response to Interventions:  Attentive, Engaged, and Receptive   Education Outcome:  Acknowledges education   Clinical Observations/Individualized Feedback: Deborah Mckee was active in their participation of session activities and group discussion. Pt identified cook and walk outside as things she does in her free time. Pt chose to play with play-doh and talk with LRT and peers while in group.    Plan: Continue to engage patient in RT group sessions 2-3x/week.   Jeoffrey BRAVO Celestia, LRT, CTRS 11/26/2023 1:12 PM

## 2023-11-26 NOTE — Progress Notes (Signed)
 On engagement Pt was lying awake in bed, mood depressed, affect flat and sad.  Shared that she is sad remorseful that she threatened SI.  She explained that she used cocaine while pregnant and her water broke resulting into earlier deliver of her child. "I feel sad and ashamed," she added and begun to sobbed.  She endorsed anxiety rated moderate, depression rated mild and denied SI/HI.  She expressed motivation for sobriety from drug use so that the baby may not be affected.  Atarax  given and Patient remains care compliant.   11/25/23 2200  Psych Admission Type (Psych Patients Only)  Admission Status Voluntary  Psychosocial Assessment  Patient Complaints Crying spells;Depression;Anxiety  Eye Contact Avertive  Facial Expression Anxious;Sad  Affect Anxious;Depressed;Sad  Speech Logical/coherent  Interaction Minimal;Isolative  Motor Activity Slow  Appearance/Hygiene Unremarkable  Behavior Characteristics Cooperative;Anxious  Mood Depressed;Anxious;Guilty;Sad  Aggressive Behavior  Effect No apparent injury  Thought Process  Coherency WDL  Content WDL  Delusions None reported or observed  Perception WDL  Hallucination None reported or observed  Judgment Limited  Confusion None  Danger to Self  Current suicidal ideation? Denies  Agreement Not to Harm Self Yes  Description of Agreement Verbal  Danger to Others  Danger to Others None reported or observed   Problem: Education: Goal: Knowledge of  General Education information/materials will improve Outcome: Progressing Goal: Emotional status will improve Outcome: Progressing Goal: Mental status will improve Outcome: Progressing Goal: Verbalization of understanding the information provided will improve Outcome: Progressing   Problem: Activity: Goal: Interest or engagement in activities will improve Outcome: Progressing Goal: Sleeping patterns will improve Outcome: Progressing   Problem: Coping: Goal: Ability to  verbalize frustrations and anger appropriately will improve Outcome: Progressing Goal: Ability to demonstrate self-control will improve Outcome: Progressing

## 2023-11-27 LAB — LITHIUM LEVEL: Lithium Lvl: 0.2 mmol/L — ABNORMAL LOW (ref 0.60–1.20)

## 2023-11-27 MED ORDER — LITHIUM CARBONATE ER 300 MG PO TBCR
600.0000 mg | EXTENDED_RELEASE_TABLET | Freq: Two times a day (BID) | ORAL | Status: DC
  Administered 2023-11-27 – 2023-12-01 (×13): 600 mg via ORAL
  Filled 2023-11-27 (×8): qty 2

## 2023-11-27 NOTE — Plan of Care (Signed)
   Problem: Education: Goal: Knowledge of [facility] Education information/materials will improve Outcome: Progressing Goal: Emotional status will improve Outcome: Progressing Goal: Mental status will improve Outcome: Progressing Goal: Verbalization of understanding the information provided will improve Outcome: Progressing

## 2023-11-27 NOTE — Progress Notes (Signed)
   11/27/23 0400  Psych Admission Type (Psych Patients Only)  Admission Status Voluntary  Psychosocial Assessment  Patient Complaints Crying spells;Depression  Eye Contact Fair  Facial Expression Sad  Affect Anxious;Depressed  Speech Logical/coherent  Interaction Assertive  Motor Activity Slow  Appearance/Hygiene Unremarkable;Layered clothes  Behavior Characteristics Cooperative;Appropriate to situation  Mood Pleasant  Aggressive Behavior  Effect No apparent injury  Thought Process  Coherency WDL  Content WDL  Delusions None reported or observed  Perception WDL  Hallucination None reported or observed  Judgment WDL  Confusion None  Danger to Self  Current suicidal ideation? Denies  Self-Injurious Behavior No self-injurious ideation or behavior indicators observed or expressed   Agreement Not to Harm Self Yes  Danger to Others  Danger to Others None reported or observed

## 2023-11-27 NOTE — Group Note (Signed)
 Date:  11/27/2023 Time:  9:30 PM  Group Topic/Focus:  Recovery Goals:   The focus of this group is to identify appropriate goals for recovery and establish a plan to achieve them.    Participation Level:  Active  Participation Quality:  Appropriate  Affect:  Appropriate  Cognitive:  Appropriate  Insight: Appropriate  Engagement in Group:  Engaged  Modes of Intervention:  Discussion  Additional Comments:    Deborah Mckee L 11/27/2023, 9:30 PM

## 2023-11-27 NOTE — Progress Notes (Signed)
 [facility] MD Progress Note  11/27/2023 12:32 PM Deborah Mckee  MRN:  [id number]  Deborah Mckee is a 23 yo G1P0 female presenting at [redacted]w[redacted]d IUP with concerns for PPROM 2 days ago without preterm labor. Pregnancy has also been complicated by extensive mental health history including bipolar affective disorder, substance abuse (UDS positive for cocaine, THC, benzos), Chlamydia infection (recent treatment, TOC positive on admission). Plan is currently for monitoring, possible induction on/after 8/1 ([redacted]w[redacted]d). Patient reported one time use of cocaine following break up with bf they wrote suicide notes as they feared they were going to lose custody of the child Patient is admitted to adult psych unit with Q15 min safety monitoring. Multidisciplinary team approach is offered. Medication management; group/milieu therapy is offered.   Subjective:  Chart reviewed, case discussed in multidisciplinary meeting, patient seen during rounds.   Patient seen today for follow-up psychiatric evaluation. She greets this provider pleasantly and is initially engaged during the interview. She reports that she has now seen her baby and previously described improved mood overall, though she continues to experience emotional outbursts and crying spells that she feels are not aggressive or inappropriate given her current stressors. Today, she claims someone told her she will be discharging and becomes very labile when this provider explains the plan to titrate lithium  to a therapeutic dose. Lithium  level this morning is 0.2; we will need to increase lithium  to 600 mg twice daily. She continues to deny suicidal or homicidal ideation, denies hallucinations, paranoia, or delusions. She reports sleep is good and appetite is good. She remains focused on wanting to go home despite the need for continued stabilization and effective medication management to ensure proper dosing prior to discharge.   11/26/23: Patient seen today for follow-up  psychiatric evaluation. She greets this provider pleasantly and is engaged during interview. She reports that she has now seen her baby and describes improved mood overall, though she continues to experience emotional outbursts and crying spells. She denies suicidal or homicidal ideation. She denies hallucinations, paranoia, or delusions. She describes the emotional lability as not aggressive or inappropriate given her current stressors. She reports having been stabilized in the past on lithium  600 mg per day.  Sleep: Good  Appetite:  Good  Past Psychiatric History: see h&P Family History: History reviewed. No pertinent family history. Social History:  Social History   Substance and Sexual Activity  Alcohol Use No     Social History   Substance and Sexual Activity  Drug Use Yes   Types: Marijuana, Cocaine   Comment: social    Social History   Socioeconomic History   Marital status: Single    Spouse name: Not on file   Number of children: Not on file   Years of education: Not on file   Highest education level: Not on file  Occupational History   Not on file  Tobacco Use   Smoking status: Every Day    Types: E-cigarettes   Smokeless tobacco: Never  Vaping Use   Vaping status: Every Day   Substances: Nicotine , Synthetic cannabinoids  Substance and Sexual Activity   Alcohol use: No   Drug use: Yes    Types: Marijuana, Cocaine    Comment: social   Sexual activity: Yes    Birth control/protection: None  Other Topics Concern   Not on file  Social History Narrative   Not on file   Social Drivers of Health   Financial Resource Strain: Low Risk  (05/05/2023)   Received  from [facility]   Overall Financial Resource Strain (CARDIA)    Difficulty of Paying Living Expenses: Not hard at all  Food Insecurity: Food Insecurity Present (11/23/2023)   Hunger Vital Sign    Worried About Running Out of Food in the Last Year: Sometimes true    Ran Out of Food in the Last Year:  Often true  Transportation Needs: No Transportation Needs (11/23/2023)   PRAPARE - Administrator, Civil Service (Medical): No    Lack of Transportation (Non-Medical): No  Physical Activity: Not on file  Stress: Not on file  Social Connections: Not on file   Past Medical History:  Past Medical History:  Diagnosis Date   Anxiety    Chlamydia    Depression    History of cocaine abuse (HCC)    Vaccine for human papilloma virus (HPV) types 6, 11, 16, and 18 administered    Vision abnormalities    wears glasses    Past Surgical History:  Procedure Laterality Date   INGUINAL HERNIA PEDIATRIC WITH LAPAROSCOPIC EXAM Left 04/22/2017   Procedure: INGUINAL HERNIA PEDIATRIC WITH LAP LOOK OF PELVIC FLOOR;  Surgeon: Claudius Kaplan, MD;  Location: [facility];  Service: Pediatrics;  Laterality: Left;   INGUINAL HERNIA REPAIR Right 2008   LAPAROTOMY N/A 11/21/2023   Procedure: Revision of C Section Incision;  Surgeon: Verdon Keen, MD;  Location: [facility];  Service: Gynecology;  Laterality: N/A;    Current Medications: Current Facility-Administered Medications  Medication Dose Route Frequency Provider Last Rate Last Admin   acetaminophen  (TYLENOL ) tablet 1,000 mg  1,000 mg Oral Q6H Jadapalle, Sree, MD   1,000 mg at 11/27/23 0421   acetaminophen  (TYLENOL ) tablet 650 mg  650 mg Oral Q6H PRN Donnelly Mellow, MD       alum & mag hydroxide-simeth (MAALOX/MYLANTA) 200-200-20 MG/5ML suspension 30 mL  30 mL Oral Q4H PRN Jadapalle, Sree, MD       bisacodyl  (DULCOLAX) suppository 10 mg  10 mg Rectal Daily PRN Jadapalle, Sree, MD       cephALEXin  (KEFLEX ) capsule 500 mg  500 mg Oral Q12H Jadapalle, Sree, MD   500 mg at 11/27/23 9193   coconut oil  1 Application Topical PRN Jadapalle, Sree, MD       witch hazel-glycerin  (TUCKS) pad 1 Application  1 Application Topical PRN Jadapalle, Sree, MD       And   dibucaine (NUPERCAINAL) 1 % rectal ointment 1 Application  1 Application  Rectal PRN Jadapalle, Sree, MD       diphenhydrAMINE  (BENADRYL ) capsule 25 mg  25 mg Oral Q6H PRN Jadapalle, Sree, MD       haloperidol  (HALDOL ) tablet 5 mg  5 mg Oral TID PRN Jadapalle, Sree, MD   5 mg at 11/24/23 1157   And   diphenhydrAMINE  (BENADRYL ) capsule 50 mg  50 mg Oral TID PRN Jadapalle, Sree, MD   50 mg at 11/24/23 1157   haloperidol  lactate (HALDOL ) injection 5 mg  5 mg Intramuscular TID PRN Jadapalle, Sree, MD       And   diphenhydrAMINE  (BENADRYL ) injection 50 mg  50 mg Intramuscular TID PRN Jadapalle, Sree, MD       And   LORazepam  (ATIVAN ) injection 2 mg  2 mg Intramuscular TID PRN Jadapalle, Sree, MD       haloperidol  lactate (HALDOL ) injection 10 mg  10 mg Intramuscular TID PRN Donnelly Mellow, MD       And  diphenhydrAMINE  (BENADRYL ) injection 50 mg  50 mg Intramuscular TID PRN Donnelly Mellow, MD       And   LORazepam  (ATIVAN ) injection 2 mg  2 mg Intramuscular TID PRN Donnelly Mellow, MD       docusate sodium  (COLACE) capsule 100 mg  100 mg Oral BID Deborah Hoy HERO, NP   100 mg at 11/27/23 9192   ferrous sulfate  tablet 325 mg  325 mg Oral BID WC Jadapalle, Sree, MD   325 mg at 11/27/23 9193   gabapentin  (NEURONTIN ) capsule 300 mg  300 mg Oral QHS Jadapalle, Sree, MD   300 mg at 11/26/23 2125   hydrOXYzine  (ATARAX ) tablet 25 mg  25 mg Oral Q6H PRN Donnelly Mellow, MD   25 mg at 11/25/23 1957   ibuprofen  (ADVIL ) tablet 600 mg  600 mg Oral Q6H Jadapalle, Sree, MD   600 mg at 11/27/23 9193   lithium  carbonate (LITHOBID ) ER tablet 600 mg  600 mg Oral Q12H Deborah Hoy HERO, NP       lurasidone  (LATUDA ) tablet 20 mg  20 mg Oral Q supper Jadapalle, Sree, MD   20 mg at 11/26/23 1714   magnesium  hydroxide (MILK OF MAGNESIA) suspension 30 mL  30 mL Oral Daily PRN Donnelly Mellow, MD       ondansetron  (ZOFRAN ) tablet 4 mg  4 mg Oral Q8H PRN Jadapalle, Sree, MD       oxyCODONE  (Oxy IR/ROXICODONE ) immediate release tablet 5 mg  5 mg Oral Q6H PRN Jadapalle, Sree, MD   5 mg at  11/25/23 1437   prenatal multivitamin tablet 1 tablet  1 tablet Oral Q1200 Jadapalle, Sree, MD   1 tablet at 11/27/23 1225   senna-docusate (Senokot-S) tablet 2 tablet  2 tablet Oral Q24H Jadapalle, Sree, MD   2 tablet at 11/26/23 2124   simethicone  (MYLICON) chewable tablet 80 mg  80 mg Oral TID PC Jadapalle, Sree, MD   80 mg at 11/27/23 1224   simethicone  (MYLICON) chewable tablet 80 mg  80 mg Oral PRN Jadapalle, Sree, MD        Lab Results:  Results for orders placed or performed during the hospital encounter of 11/23/23 (from the past 48 hours)  Lithium  level     Status: Abnormal   Collection Time: 11/27/23  7:10 AM  Result Value Ref Range   Lithium  Lvl 0.20 (L) 0.60 - 1.20 mmol/L    Comment: Performed at [facility], [address] [place], [place] [address]    Blood Alcohol level:  Lab Results  Component Value Date   ETH 29 (H) 09/12/2022    Metabolic Disorder Labs: Lab Results  Component Value Date   HGBA1C 4.6 (L) 02/12/2021   MPG 85.32 02/12/2021   No results found for: PROLACTIN Lab Results  Component Value Date   CHOL 140 02/12/2021   TRIG 27 02/12/2021   HDL 59 02/12/2021   CHOLHDL 2.4 02/12/2021   VLDL 5 02/12/2021   LDLCALC 76 02/12/2021      Psychiatric Specialty Exam: Appearance: Casually dressed, adequately groomed, in hospital attire Eye Contact: Appropriate Speech: Clear, normal rate, rhythm, and volume Mood: Reports mood is "good" but becomes irritable/labile during discussion of discharge plan Affect: Labile, congruent with mood Thought Process: Linear and goal-directed Thought Content: No suicidal ideation, no homicidal ideation, no hallucinations, no paranoia, no delusions Orientation: Alert and oriented to person, place, time, and situation Attention: Intact Memory: Grossly intact Concentration: Adequate for conversation Recall: Intact for recent  and remote events Fund of Knowledge: Appropriate for age and  education Language: Fluent, no word-finding difficulty Insight: Limited regarding need for continued stabilization Judgment: Impaired by focus on discharge despite clinical need for ongoing treatment Sleep: Reports good sleep Appetite: Reports good appetite  Musculoskeletal: Strength & Muscle Tone: within normal limits Gait & Station: normal  Assets  Assets: Manufacturing systems engineer; Resilience; Social Support    Physical Exam: Physical Exam ROS Blood pressure 98/71, pulse 92, temperature (!) 97.5 F (36.4 C), resp. rate 15, height 5' 6 (1.676 m), weight 61.2 kg, last menstrual period 03/26/2023, SpO2 100%, unknown if currently breastfeeding. Body mass index is 21.78 kg/m.  Diagnosis: Principal Problem:   MDD (major depressive disorder), recurrent severe, without psychosis (HCC)   PLAN: Safety and Monitoring:  -- Voluntary admission to inpatient psychiatric unit for safety, stabilization and treatment  -- Daily contact with patient to assess and evaluate symptoms and progress in treatment  -- Patient's case to be discussed in multi-disciplinary team meeting  -- Observation Level : q15 minute checks  -- Vital signs:  q12 hours  -- Precautions: suicide, elopement, and assault -- Encouraged patient to participate in unit milieu and in scheduled group therapies  2. Psychiatric Diagnoses and Treatment:  Patient with history of bipolar disorder, borderline traits, recently delivered baby premature at 33 weeks, relapse on polysubstance use, wrote suicide letters addressing her family members and to the unborn child, found by accident by patient's family raised safety concerns.    Patient continues to show improving insight and mood stabilization, particularly after seeing her newborn. While she remains emotionally labile, these symptoms are contextually appropriate and non-aggressive. She maintains hopeful outlook regarding CPS involvement and demonstrates protective maternal  motivation. She reports past benefit from lithium  and expresses openness to medication reassessment.  Patient continues to require this acute inpatient psychiatric setting for safety, medication management, and symptom stabilization to address need for continued treatment.  Diagnosis: Bipolar Disorder Borderline Traits  Medications:  Increase Lithium  600 mg BID, li recheck CMP CBC and TSH ordered as well  Latuda  20 mg daily-plan to titrated the dose Patient does not want to breast-feed at this time given the initiation of lithium  and also she reports not being comfortable pumping breastmilk in the hospital. Discussed possible bubble packing Lithium  or mom assist with medication safety related to overdose potential of medication.    -- The risks/benefits/side-effects/alternatives to this medication were discussed in detail with the patient and time was given for questions. The patient consents to medication trial.                -- Metabolic profile and EKG monitoring obtained while on an atypical antipsychotic (BMI: Lipid Panel: HbgA1c: QTc:)              -- Encouraged patient to participate in unit milieu and in scheduled group therapies                    3. Medical Issues Being Addressed: assist pt with post partum discharge instructions.     4. Discharge Planning:   -- Social work and case management to assist with discharge planning and identification of hospital follow-up needs prior to discharge  -- Estimated LOS: 3-4 days  Hoy CHRISTELLA Pinal, NP 11/27/2023, 12:32 PM

## 2023-11-27 NOTE — Progress Notes (Signed)
   11/27/23 1935  Psych Admission Type (Psych Patients Only)  Admission Status Voluntary  Psychosocial Assessment  Patient Complaints Crying spells;Depression  Eye Contact Fair  Facial Expression Anxious;Sad  Affect Anxious  Speech Logical/coherent  Interaction Assertive  Motor Activity Restless  Appearance/Hygiene Unremarkable  Behavior Characteristics Anxious  Mood Labile;Angry;Sad  Aggressive Behavior  Effect No apparent injury  Thought Process  Coherency WDL  Content WDL  Delusions None reported or observed  Perception WDL  Hallucination None reported or observed  Judgment WDL  Confusion None  Danger to Self  Current suicidal ideation? Denies  Description of Suicide Plan no plan  Self-Injurious Behavior No self-injurious ideation or behavior indicators observed or expressed   Agreement Not to Harm Self Yes  Description of Agreement verbal  Danger to Others  Danger to Others None reported or observed

## 2023-11-27 NOTE — Plan of Care (Signed)
  Problem: Education: Goal: Mental status will improve Outcome: Progressing Goal: Verbalization of understanding the information provided will improve Outcome: Progressing   Problem: Activity: Goal: Interest or engagement in activities will improve Outcome: Progressing Goal: Sleeping patterns will improve Outcome: Progressing   Problem: Coping: Goal: Ability to verbalize frustrations and anger appropriately will improve Outcome: Progressing Goal: Ability to demonstrate self-control will improve Outcome: Progressing   Problem: Health Behavior/Discharge Planning: Goal: Compliance with treatment plan for underlying cause of condition will improve Outcome: Progressing   Problem: Education: Goal: Emotional status will improve Outcome: Not Progressing Note: Labile. Crying spells and periods of anger

## 2023-11-27 NOTE — Progress Notes (Signed)
   11/27/23 0857  Psych Admission Type (Psych Patients Only)  Admission Status Voluntary  Psychosocial Assessment  Patient Complaints Anxiety;Depression  Eye Contact Fair  Facial Expression Flat  Affect Flat  Speech Logical/coherent  Interaction Assertive  Motor Activity Slow  Appearance/Hygiene Unremarkable  Behavior Characteristics Cooperative;Appropriate to situation  Mood Anxious;Depressed  Aggressive Behavior  Effect No apparent injury  Thought Process  Coherency WDL  Content WDL  Delusions None reported or observed  Perception WDL  Hallucination None reported or observed  Judgment WDL  Confusion None  Danger to Self  Current suicidal ideation? Denies  Self-Injurious Behavior No self-injurious ideation or behavior indicators observed or expressed   Agreement Not to Harm Self Yes  Description of Agreement verbal  Danger to Others  Danger to Others None reported or observed

## 2023-11-28 NOTE — Group Note (Signed)
 Date:  11/28/2023 Time:  4:58 PM  Group Topic/Focus:  Activity Group: The focus of the group is to promote activity for the patients and to encourage them to go outside to the courtyard for some fresh air and some exercise.    Participation Level:  Active  Participation Quality:  Appropriate  Affect:  Appropriate  Cognitive:  Appropriate  Insight: Appropriate  Engagement in Group:  Engaged  Modes of Intervention:  Activity  Additional Comments:    Deborah Mckee 11/28/2023, 4:58 PM

## 2023-11-28 NOTE — Progress Notes (Signed)
   11/28/23 0904  Psych Admission Type (Psych Patients Only)  Admission Status Voluntary  Psychosocial Assessment  Patient Complaints Anxiety;Depression  Eye Contact Fair  Facial Expression Anxious;Sad  Affect Anxious  Speech Logical/coherent  Interaction Assertive  Motor Activity Slow  Appearance/Hygiene Unremarkable  Behavior Characteristics Cooperative  Mood Anxious;Depressed  Aggressive Behavior  Effect No apparent injury  Thought Process  Coherency WDL  Content WDL  Delusions None reported or observed  Perception WDL  Hallucination None reported or observed  Judgment WDL  Confusion None  Danger to Self  Current suicidal ideation? Denies  Self-Injurious Behavior No self-injurious ideation or behavior indicators observed or expressed   Agreement Not to Harm Self Yes  Description of Agreement verbal  Danger to Others  Danger to Others None reported or observed

## 2023-11-28 NOTE — Plan of Care (Signed)

## 2023-11-28 NOTE — Plan of Care (Signed)

## 2023-11-28 NOTE — Progress Notes (Signed)
 [facility] MD Progress Note  11/28/2023 1:14 PM Deborah Mckee  MRN:  [id number]  Deborah Mckee is a 23 yo G1P0 female presenting at [redacted]w[redacted]d IUP with concerns for PPROM 2 days ago without preterm labor. Pregnancy has also been complicated by extensive mental health history including bipolar affective disorder, substance abuse (UDS positive for cocaine, THC, benzos), Chlamydia infection (recent treatment, TOC positive on admission). Plan is currently for monitoring, possible induction on/after 8/1 ([redacted]w[redacted]d). Patient reported one time use of cocaine following break up with bf they wrote suicide notes as they feared they were going to lose custody of the child Patient is admitted to adult psych unit with Q15 min safety monitoring. Multidisciplinary team approach is offered. Medication management; group/milieu therapy is offered.   Subjective:  Chart reviewed, case discussed in multidisciplinary meeting, patient seen during rounds.   Patient seen today for follow-up psychiatric evaluation. She has been rather insistent upon going home and becomes emotionally labile when it is suggested that her inpatient stay may be needed for fully titrating lithium . She was seen today with social work to review that her lithium  dose has been increased and that she will need to remain until a follow-up lithium  level can be obtained to ensure an appropriate therapeutic dose. Initially, she became tearful and anxious with this news; however, she was able to redirect and compose herself appropriately without inappropriate responses, showing improved insight, judgment, and mood maintenance. She continues to deny suicidal ideation (SI) and homicidal ideation (HI). She states, "I just want everything to be okay with my baby." She denies hallucinations, paranoia, or delusions. Reports eating and sleeping well.   11/27/23: Patient seen today for follow-up psychiatric evaluation. She greets this provider pleasantly and is initially engaged  during the interview. She reports that she has now seen her baby and previously described improved mood overall, though she continues to experience emotional outbursts and crying spells that she feels are not aggressive or inappropriate given her current stressors. Today, she claims someone told her she will be discharging and becomes very labile when this provider explains the plan to titrate lithium  to a therapeutic dose. Lithium  level this morning is 0.2; we will need to increase lithium  to 600 mg twice daily. She continues to deny suicidal or homicidal ideation, denies hallucinations, paranoia, or delusions. She reports sleep is good and appetite is good. She remains focused on wanting to go home despite the need for continued stabilization and effective medication management to ensure proper dosing prior to discharge.   11/26/23: Patient seen today for follow-up psychiatric evaluation. She greets this provider pleasantly and is engaged during interview. She reports that she has now seen her baby and describes improved mood overall, though she continues to experience emotional outbursts and crying spells. She denies suicidal or homicidal ideation. She denies hallucinations, paranoia, or delusions. She describes the emotional lability as not aggressive or inappropriate given her current stressors. She reports having been stabilized in the past on lithium  600 mg per day.  Sleep: Good  Appetite:  Good  Past Psychiatric History: see h&P Family History: History reviewed. No pertinent family history. Social History:  Social History   Substance and Sexual Activity  Alcohol Use No     Social History   Substance and Sexual Activity  Drug Use Yes   Types: Marijuana, Cocaine   Comment: social    Social History   Socioeconomic History   Marital status: Single    Spouse name: Not on file  Number of children: Not on file   Years of education: Not on file   Highest education level: Not on file   Occupational History   Not on file  Tobacco Use   Smoking status: Every Day    Types: E-cigarettes   Smokeless tobacco: Never  Vaping Use   Vaping status: Every Day   Substances: Nicotine , Synthetic cannabinoids  Substance and Sexual Activity   Alcohol use: No   Drug use: Yes    Types: Marijuana, Cocaine    Comment: social   Sexual activity: Yes    Birth control/protection: None  Other Topics Concern   Not on file  Social History Narrative   Not on file   Social Drivers of Health   Financial Resource Strain: Low Risk  (05/05/2023)   Received from [facility]   Overall Financial Resource Strain (CARDIA)    Difficulty of Paying Living Expenses: Not hard at all  Food Insecurity: Food Insecurity Present (11/23/2023)   Hunger Vital Sign    Worried About Running Out of Food in the Last Year: Sometimes true    Ran Out of Food in the Last Year: Often true  Transportation Needs: No Transportation Needs (11/23/2023)   PRAPARE - Administrator, Civil Service (Medical): No    Lack of Transportation (Non-Medical): No  Physical Activity: Not on file  Stress: Not on file  Social Connections: Not on file   Past Medical History:  Past Medical History:  Diagnosis Date   Anxiety    Chlamydia    Depression    History of cocaine abuse (HCC)    Vaccine for human papilloma virus (HPV) types 6, 11, 16, and 18 administered    Vision abnormalities    wears glasses    Past Surgical History:  Procedure Laterality Date   INGUINAL HERNIA PEDIATRIC WITH LAPAROSCOPIC EXAM Left 04/22/2017   Procedure: INGUINAL HERNIA PEDIATRIC WITH LAP LOOK OF PELVIC FLOOR;  Surgeon: Claudius Kaplan, MD;  Location: [facility];  Service: Pediatrics;  Laterality: Left;   INGUINAL HERNIA REPAIR Right 2008   LAPAROTOMY N/A 11/21/2023   Procedure: Revision of C Section Incision;  Surgeon: Verdon Keen, MD;  Location: [facility];  Service: Gynecology;  Laterality: N/A;    Current  Medications: Current Facility-Administered Medications  Medication Dose Route Frequency Provider Last Rate Last Admin   acetaminophen  (TYLENOL ) tablet 1,000 mg  1,000 mg Oral Q6H Jadapalle, Sree, MD   1,000 mg at 11/28/23 1041   acetaminophen  (TYLENOL ) tablet 650 mg  650 mg Oral Q6H PRN Donnelly Mellow, MD       alum & mag hydroxide-simeth (MAALOX/MYLANTA) 200-200-20 MG/5ML suspension 30 mL  30 mL Oral Q4H PRN Jadapalle, Sree, MD       bisacodyl  (DULCOLAX) suppository 10 mg  10 mg Rectal Daily PRN Jadapalle, Sree, MD       coconut oil  1 Application Topical PRN Jadapalle, Sree, MD       witch hazel-glycerin  (TUCKS) pad 1 Application  1 Application Topical PRN Jadapalle, Sree, MD       And   dibucaine (NUPERCAINAL) 1 % rectal ointment 1 Application  1 Application Rectal PRN Jadapalle, Sree, MD       diphenhydrAMINE  (BENADRYL ) capsule 25 mg  25 mg Oral Q6H PRN Jadapalle, Sree, MD       haloperidol  (HALDOL ) tablet 5 mg  5 mg Oral TID PRN Jadapalle, Sree, MD   5 mg at 11/24/23 1157   And  diphenhydrAMINE  (BENADRYL ) capsule 50 mg  50 mg Oral TID PRN Jadapalle, Sree, MD   50 mg at 11/24/23 1157   haloperidol  lactate (HALDOL ) injection 5 mg  5 mg Intramuscular TID PRN Jadapalle, Sree, MD       And   diphenhydrAMINE  (BENADRYL ) injection 50 mg  50 mg Intramuscular TID PRN Jadapalle, Sree, MD       And   LORazepam  (ATIVAN ) injection 2 mg  2 mg Intramuscular TID PRN Jadapalle, Sree, MD       haloperidol  lactate (HALDOL ) injection 10 mg  10 mg Intramuscular TID PRN Jadapalle, Sree, MD       And   diphenhydrAMINE  (BENADRYL ) injection 50 mg  50 mg Intramuscular TID PRN Jadapalle, Sree, MD       And   LORazepam  (ATIVAN ) injection 2 mg  2 mg Intramuscular TID PRN Jadapalle, Sree, MD       docusate sodium  (COLACE) capsule 100 mg  100 mg Oral BID Cleotilde Hoy HERO, NP   100 mg at 11/28/23 9167   ferrous sulfate  tablet 325 mg  325 mg Oral BID WC Jadapalle, Sree, MD   325 mg at 11/28/23 9167   gabapentin   (NEURONTIN ) capsule 300 mg  300 mg Oral QHS Jadapalle, Sree, MD   300 mg at 11/27/23 2229   hydrOXYzine  (ATARAX ) tablet 25 mg  25 mg Oral Q6H PRN Jadapalle, Sree, MD   25 mg at 11/25/23 1957   ibuprofen  (ADVIL ) tablet 600 mg  600 mg Oral Q6H Jadapalle, Sree, MD   600 mg at 11/28/23 9167   lithium  carbonate (LITHOBID ) ER tablet 600 mg  600 mg Oral Q12H Cleotilde Hoy HERO, NP   600 mg at 11/28/23 9167   lurasidone  (LATUDA ) tablet 20 mg  20 mg Oral Q supper Jadapalle, Sree, MD   20 mg at 11/27/23 1646   magnesium  hydroxide (MILK OF MAGNESIA) suspension 30 mL  30 mL Oral Daily PRN Donnelly Mellow, MD       ondansetron  (ZOFRAN ) tablet 4 mg  4 mg Oral Q8H PRN Jadapalle, Sree, MD       oxyCODONE  (Oxy IR/ROXICODONE ) immediate release tablet 5 mg  5 mg Oral Q6H PRN Jadapalle, Sree, MD   5 mg at 11/25/23 1437   prenatal multivitamin tablet 1 tablet  1 tablet Oral Q1200 Jadapalle, Sree, MD   1 tablet at 11/28/23 1243   senna-docusate (Senokot-S) tablet 2 tablet  2 tablet Oral Q24H Jadapalle, Sree, MD   2 tablet at 11/27/23 2229   simethicone  (MYLICON) chewable tablet 80 mg  80 mg Oral TID PC Jadapalle, Sree, MD   80 mg at 11/28/23 9166   simethicone  (MYLICON) chewable tablet 80 mg  80 mg Oral PRN Jadapalle, Sree, MD        Lab Results:  Results for orders placed or performed during the hospital encounter of 11/23/23 (from the past 48 hours)  Lithium  level     Status: Abnormal   Collection Time: 11/27/23  7:10 AM  Result Value Ref Range   Lithium  Lvl 0.20 (L) 0.60 - 1.20 mmol/L    Comment: Performed at [facility], [address] [place], [place] [address]    Blood Alcohol level:  Lab Results  Component Value Date   ETH 29 (H) 09/12/2022    Metabolic Disorder Labs: Lab Results  Component Value Date   HGBA1C 4.6 (L) 02/12/2021   MPG 85.32 02/12/2021   No results found for: PROLACTIN Lab Results  Component Value  Date   CHOL 140 02/12/2021   TRIG 27 02/12/2021   HDL 59  02/12/2021   CHOLHDL 2.4 02/12/2021   VLDL 5 02/12/2021   LDLCALC 76 02/12/2021      Psychiatric Specialty Exam: Appearance: Dressed appropriately, good hygiene Eye Contact: Good Speech: Normal rate, rhythm, and volume Mood: "I just want everything to be okay with my baby" Affect: Improved stability, mood congruent Thought Process: Linear and goal-directed Thought Content: Denies SI/HI, hallucinations, paranoia, or delusions Orientation: Alert and oriented to person, place, time, and situation Attention: Sustained Memory: Grossly intact Concentration: Adequate for interview Recall: Intact for recent and remote events Fund of Knowledge: Appropriate for age and education Language: Fluent, appropriate vocabulary Insight: Improving Judgment: Improving Psychomotor Activity: No agitation or retardation observed Sleep: Reports good sleep Appetite: Reports eating well  Musculoskeletal: Strength & Muscle Tone: within normal limits Gait & Station: normal  Assets  Assets: Manufacturing systems engineer; Resilience; Social Support    Physical Exam: Physical Exam ROS Blood pressure 98/60, pulse 69, temperature 98.1 F (36.7 C), resp. rate 17, height 5' 6 (1.676 m), weight 61.2 kg, last menstrual period 03/26/2023, SpO2 100%, unknown if currently breastfeeding. Body mass index is 21.78 kg/m.  Diagnosis: Principal Problem:   MDD (major depressive disorder), recurrent severe, without psychosis (HCC)   PLAN: Safety and Monitoring:  -- Voluntary admission to inpatient psychiatric unit for safety, stabilization and treatment  -- Daily contact with patient to assess and evaluate symptoms and progress in treatment  -- Patient's case to be discussed in multi-disciplinary team meeting  -- Observation Level : q15 minute checks  -- Vital signs:  q12 hours  -- Precautions: suicide, elopement, and assault -- Encouraged patient to participate in unit milieu and in scheduled group therapies  2.  Psychiatric Diagnoses and Treatment:  Patient with history of bipolar disorder, borderline traits, recently delivered baby premature at 33 weeks, relapse on polysubstance use, wrote suicide letters addressing her family members and to the unborn child, found by accident by patient's family raised safety concerns.    Patient continues to show improving insight and mood stabilization, particularly after seeing her newborn. While she remains emotionally labile, these symptoms are contextually appropriate and non-aggressive. She maintains hopeful outlook regarding CPS involvement and demonstrates protective maternal motivation. She reports past benefit from lithium  and expresses openness to medication reassessment.  Patient continues to require this acute inpatient psychiatric setting for safety, medication management, and symptom stabilization to address need for continued treatment.  Diagnosis: Bipolar Disorder Borderline Traits  Medications:  Increase Lithium  600 mg BID, li recheck CMP CBC and TSH ordered as well 11/30/23 Latuda  20 mg daily-plan to titrated the dose Patient does not want to breast-feed at this time given the initiation of lithium  and also she reports not being comfortable pumping breastmilk in the hospital. Discussed possible bubble packing Lithium  or mom assist with medication safety related to overdose potential of medication.    -- The risks/benefits/side-effects/alternatives to this medication were discussed in detail with the patient and time was given for questions. The patient consents to medication trial.                -- Metabolic profile and EKG monitoring obtained while on an atypical antipsychotic (BMI: Lipid Panel: HbgA1c: QTc:)              -- Encouraged patient to participate in unit milieu and in scheduled group therapies  3. Medical Issues Being Addressed: assist pt with post partum discharge instructions.     4. Discharge Planning:    -- Social work and case management to assist with discharge planning and identification of hospital follow-up needs prior to discharge  -- Estimated LOS: 3-4 days  Hoy CHRISTELLA Pinal, NP 11/28/2023, 1:14 PM

## 2023-11-28 NOTE — Group Note (Signed)
 Date:  11/28/2023 Time:  4:53 PM  Group Topic/Focus:  Healthy Communication:   The focus of this group is to discuss communication, barriers to communication, as well as healthy ways to communicate with others.    Participation Level:  Active  Participation Quality:  Appropriate, Sharing, and Supportive  Affect:  Appropriate  Cognitive:  Appropriate  Insight: Appropriate and Good  Engagement in Group:  Engaged and Supportive  Modes of Intervention:  Activity, Discussion, Socialization, and Support  Additional Comments:  n/a  Deborah Mckee 11/28/2023, 4:53 PM

## 2023-11-28 NOTE — Group Note (Signed)
 Date:  11/28/2023 Time:  9:27 PM  Group Topic/Focus:  Wrap-Up Group:   The focus of this group is to help patients review their daily goal of treatment and discuss progress on daily workbooks.    Participation Level:  Did Not Attend   Deborah Mckee 11/28/2023, 9:27 PM

## 2023-11-29 DIAGNOSIS — F319 Bipolar disorder, unspecified: Secondary | ICD-10-CM | POA: Diagnosis not present

## 2023-11-29 DIAGNOSIS — O99323 Drug use complicating pregnancy, third trimester: Secondary | ICD-10-CM | POA: Diagnosis not present

## 2023-11-29 DIAGNOSIS — Z3A00 Weeks of gestation of pregnancy not specified: Secondary | ICD-10-CM | POA: Diagnosis not present

## 2023-11-29 LAB — LIPID PANEL
Cholesterol: 198 mg/dL (ref 0–200)
HDL: 81 mg/dL (ref >40)
LDL Cholesterol: 96 mg/dL (ref 0–99)
Total CHOL/HDL Ratio: 2.4 ratio
Triglycerides: 107 mg/dL (ref <150)
VLDL: 21 mg/dL (ref 0–40)

## 2023-11-29 NOTE — Progress Notes (Signed)
   11/29/23 1148  Psychosocial Assessment  Patient Complaints Anxiety;Depression  Eye Contact Fair  Facial Expression Anxious;Sad  Affect Anxious  Speech Logical/coherent  Interaction Assertive  Motor Activity Slow  Appearance/Hygiene Unremarkable  Behavior Characteristics Cooperative  Mood Pleasant  Aggressive Behavior  Effect No apparent injury  Thought Process  Coherency WDL  Content WDL  Delusions None reported or observed  Perception WDL  Hallucination None reported or observed  Judgment WDL  Confusion None  Danger to Self  Current suicidal ideation? Denies  Self-Injurious Behavior No self-injurious ideation or behavior indicators observed or expressed   Agreement Not to Harm Self Yes  Description of Agreement verbal  Danger to Others  Danger to Others None reported or observed

## 2023-11-29 NOTE — Group Note (Signed)
 Date:  11/29/2023 Time:  10:21 AM  Group Topic/Focus:  Goals Group:   The focus of this group is to help patients establish daily goals to achieve during treatment and discuss how the patient can incorporate goal setting into their daily lives to aide in recovery.    Participation Level:  Active  Participation Quality:  Appropriate  Affect:  Appropriate  Cognitive:  Alert  Insight: Appropriate  Engagement in Group:  Engaged  Modes of Intervention:  Activity, Discussion, and Education  Additional Comments:     Deborah Mckee 11/29/2023, 10:21 AM

## 2023-11-29 NOTE — Group Note (Signed)
 Recreation Therapy Group Note   Group Topic:Health and Wellness  Group Date: 11/29/2023 Start Time: 1040 End Time: 1140 Facilitators: Celestia Jeoffrey BRAVO, LRT, CTRS Location: Courtyard  Group Description: [employer]. LRT and patients played games of basketball, drew with chalk, and played corn hole while outside in the courtyard while getting fresh air and sunlight. Music was being played in the background. LRT and peers conversed about different games they have played before, what they do in their free time and anything else that is on their minds. LRT encouraged pts to drink water after being outside, sweating and getting their heart rate up.  Goal Area(s) Addressed: Patient will build on frustration tolerance skills. Patients will partake in a competitive play game with peers. Patients will gain knowledge of new leisure interest/hobby.    Affect/Mood: Appropriate   Participation Level: Active   Participation Quality: Independent   Behavior: Appropriate   Speech/Thought Process: Coherent   Insight: Fair   Judgement: Fair    Modes of Intervention: Activity   Patient Response to Interventions:  Receptive   Education Outcome:  In group clarification offered    Clinical Observations/Individualized Feedback: Deborah Mckee was active in their participation of session activities and group discussion. Pt interacted well with LRT and peers duration of session.    Plan: Continue to engage patient in RT group sessions 2-3x/week.   Jeoffrey BRAVO Celestia, LRT, CTRS 11/29/2023 1:13 PM

## 2023-11-29 NOTE — Plan of Care (Signed)

## 2023-11-29 NOTE — Group Note (Signed)
 Date:  11/29/2023 Time:  9:07 PM  Group Topic/Focus:  Personal Choices and Values:   The focus of this group is to help patients assess and explore the importance of values in their lives, how their values affect their decisions, how they express their values and what opposes their expression.    Participation Level:  Active  Participation Quality:  Appropriate  Affect:  Appropriate  Cognitive:  Appropriate  Insight: Appropriate  Engagement in Group:  Engaged  Modes of Intervention:  Discussion  Additional Comments:    Favour Aleshire L 11/29/2023, 9:07 PM

## 2023-11-29 NOTE — Group Note (Signed)
 [facility] LCSW Group Therapy Note    Group Date: 11/29/2023 Start Time: 1300 End Time: 1400  Type of Therapy and Topic:  Group Therapy:  Overcoming Obstacles  Participation Level:  [facility] PARTICIPATION LEVEL: Did Not Attend  Mood:  Description of Group:   In this group patients will be encouraged to explore what they see as obstacles to their own wellness and recovery. They will be guided to discuss their thoughts, feelings, and behaviors related to these obstacles. The group will process together ways to cope with barriers, with attention given to specific choices patients can make. Each patient will be challenged to identify changes they are motivated to make in order to overcome their obstacles. This group will be process-oriented, with patients participating in exploration of their own experiences as well as giving and receiving support and challenge from other group members.  Therapeutic Goals: 1. Patient will identify personal and current obstacles as they relate to admission. 2. Patient will identify barriers that currently interfere with their wellness or overcoming obstacles.  3. Patient will identify feelings, thought process and behaviors related to these barriers. 4. Patient will identify two changes they are willing to make to overcome these obstacles:    Summary of Patient Progress   X   Therapeutic Modalities:   Cognitive Behavioral Therapy Solution Focused Therapy Motivational Interviewing Relapse Prevention Therapy   [name], LCSW

## 2023-11-29 NOTE — Group Note (Signed)
 Recreation Therapy Group Note   Group Topic:Coping Skills  Group Date: 11/29/2023 Start Time: 1530 End Time: 1550 Facilitators: Celestia Jeoffrey FORBES ARTICE, CTRS Location: Craft Room  Group Description: Mind Map.  Patient was provided a blank template of a diagram with 32 blank boxes in a tiered system, branching from the center (similar to a bubble chart). LRT directed patients to label the middle of the diagram Coping Skills. LRT and patients then came up with 8 different coping skills as examples. Pt were directed to record their coping skills in the 2nd tier boxes closest to the center.  Patients would then share their coping skills with the group as LRT wrote them out. LRT gave a handout of 99 different coping skills at the end of group.   Goal Area(s) Addressed: Patients will be able to define "coping skills". Patient will identify new coping skills.  Patient will increase communication.   Affect/Mood: Appropriate   Participation Level: Active and Engaged   Participation Quality: Independent   Behavior: Appropriate   Speech/Thought Process: Coherent   Insight: Good and Improved   Judgement: Good and Improved   Modes of Intervention: Education, Worksheet, and Writing   Patient Response to Interventions:  Attentive, Engaged, Interested , and Receptive   Education Outcome:  Acknowledges education   Clinical Observations/Individualized Feedback: Kinda was active in their participation of session activities and group discussion. Pt identified eating, sleeping, sports, hiking as coping skills. Pt interacted well with LRT and peers duration of session.    Plan: Continue to engage patient in RT group sessions 2-3x/week.   Jeoffrey FORBES Celestia, LRT, CTRS 11/29/2023 5:25 PM

## 2023-11-29 NOTE — Progress Notes (Signed)
 [facility] MD Progress Note  11/29/2023 8:55 AM Deborah Mckee  MRN:  [id number]  Deborah CHRISTELLA.Villafuerte is a 23 yo G1P0 female presenting at [redacted]w[redacted]d IUP with concerns for PPROM 2 days ago without preterm labor. Pregnancy has also been complicated by extensive mental health history including bipolar affective disorder, substance abuse (UDS positive for cocaine, THC, benzos), Chlamydia infection (recent treatment, TOC positive on admission). Plan is currently for monitoring, possible induction on/after 8/1 ([redacted]w[redacted]d). Patient reported one time use of cocaine following break up with bf they wrote suicide notes as they feared they were going to lose custody of the child Patient is admitted to adult psych unit with Q15 min safety monitoring. Multidisciplinary team approach is offered. Medication management; group/milieu therapy is offered.   Subjective:  Chart reviewed, case discussed in multidisciplinary meeting, patient seen during rounds.   Patient seen for follow-up. They are alert and oriented. They are anxious and cooperative on exam. They want to know when they are going home. They continue to deny SI/HI/AVH. They are linear and logical on exam. They are future oriented. Continue to note a plan to go live with their mother. Report stable appetite and sleep. They voice no concerns or complaints at this time. Labs schedule for the AM. No med adjustments.   11/28/23 Patient seen today for follow-up psychiatric evaluation. She has been rather insistent upon going home and becomes emotionally labile when it is suggested that her inpatient stay may be needed for fully titrating lithium . She was seen today with social work to review that her lithium  dose has been increased and that she will need to remain until a follow-up lithium  level can be obtained to ensure an appropriate therapeutic dose. Initially, she became tearful and anxious with this news; however, she was able to redirect and compose herself appropriately without  inappropriate responses, showing improved insight, judgment, and mood maintenance. She continues to deny suicidal ideation (SI) and homicidal ideation (HI). She states, "I just want everything to be okay with my baby." She denies hallucinations, paranoia, or delusions. Reports eating and sleeping well.   11/27/23: Patient seen today for follow-up psychiatric evaluation. She greets this provider pleasantly and is initially engaged during the interview. She reports that she has now seen her baby and previously described improved mood overall, though she continues to experience emotional outbursts and crying spells that she feels are not aggressive or inappropriate given her current stressors. Today, she claims someone told her she will be discharging and becomes very labile when this provider explains the plan to titrate lithium  to a therapeutic dose. Lithium  level this morning is 0.2; we will need to increase lithium  to 600 mg twice daily. She continues to deny suicidal or homicidal ideation, denies hallucinations, paranoia, or delusions. She reports sleep is good and appetite is good. She remains focused on wanting to go home despite the need for continued stabilization and effective medication management to ensure proper dosing prior to discharge.   11/26/23: Patient seen today for follow-up psychiatric evaluation. She greets this provider pleasantly and is engaged during interview. She reports that she has now seen her baby and describes improved mood overall, though she continues to experience emotional outbursts and crying spells. She denies suicidal or homicidal ideation. She denies hallucinations, paranoia, or delusions. She describes the emotional lability as not aggressive or inappropriate given her current stressors. She reports having been stabilized in the past on lithium  600 mg per day.  Sleep: Good  Appetite:  Good  Past Psychiatric History: see h&P Family History: History reviewed. No  pertinent family history. Social History:  Social History   Substance and Sexual Activity  Alcohol Use No     Social History   Substance and Sexual Activity  Drug Use Yes   Types: Marijuana, Cocaine   Comment: social    Social History   Socioeconomic History   Marital status: Single    Spouse name: Not on file   Number of children: Not on file   Years of education: Not on file   Highest education level: Not on file  Occupational History   Not on file  Tobacco Use   Smoking status: Every Day    Types: E-cigarettes   Smokeless tobacco: Never  Vaping Use   Vaping status: Every Day   Substances: Nicotine , Synthetic cannabinoids  Substance and Sexual Activity   Alcohol use: No   Drug use: Yes    Types: Marijuana, Cocaine    Comment: social   Sexual activity: Yes    Birth control/protection: None  Other Topics Concern   Not on file  Social History Narrative   Not on file   Social Drivers of Health   Financial Resource Strain: Low Risk  (05/05/2023)   Received from [facility]   Overall Financial Resource Strain (CARDIA)    Difficulty of Paying Living Expenses: Not hard at all  Food Insecurity: Food Insecurity Present (11/23/2023)   Hunger Vital Sign    Worried About Running Out of Food in the Last Year: Sometimes true    Ran Out of Food in the Last Year: Often true  Transportation Needs: No Transportation Needs (11/23/2023)   PRAPARE - Administrator, Civil Service (Medical): No    Lack of Transportation (Non-Medical): No  Physical Activity: Not on file  Stress: Not on file  Social Connections: Not on file   Past Medical History:  Past Medical History:  Diagnosis Date   Anxiety    Chlamydia    Depression    History of cocaine abuse (HCC)    Vaccine for human papilloma virus (HPV) types 6, 11, 16, and 18 administered    Vision abnormalities    wears glasses    Past Surgical History:  Procedure Laterality Date   INGUINAL HERNIA PEDIATRIC WITH  LAPAROSCOPIC EXAM Left 04/22/2017   Procedure: INGUINAL HERNIA PEDIATRIC WITH LAP LOOK OF PELVIC FLOOR;  Surgeon: Claudius Kaplan, MD;  Location: [facility];  Service: Pediatrics;  Laterality: Left;   INGUINAL HERNIA REPAIR Right 2008   LAPAROTOMY N/A 11/21/2023   Procedure: Revision of C Section Incision;  Surgeon: Verdon Keen, MD;  Location: [facility];  Service: Gynecology;  Laterality: N/A;    Current Medications: Current Facility-Administered Medications  Medication Dose Route Frequency Provider Last Rate Last Admin   acetaminophen  (TYLENOL ) tablet 1,000 mg  1,000 mg Oral Q6H Jadapalle, Sree, MD   1,000 mg at 11/28/23 1709   acetaminophen  (TYLENOL ) tablet 650 mg  650 mg Oral Q6H PRN Donnelly Mellow, MD       alum & mag hydroxide-simeth (MAALOX/MYLANTA) 200-200-20 MG/5ML suspension 30 mL  30 mL Oral Q4H PRN Jadapalle, Sree, MD       bisacodyl  (DULCOLAX) suppository 10 mg  10 mg Rectal Daily PRN Jadapalle, Sree, MD       coconut oil  1 Application Topical PRN Jadapalle, Sree, MD       witch hazel-glycerin  (TUCKS) pad 1 Application  1 Application Topical PRN Jadapalle, Sree,  MD       And   dibucaine (NUPERCAINAL) 1 % rectal ointment 1 Application  1 Application Rectal PRN Jadapalle, Sree, MD       diphenhydrAMINE  (BENADRYL ) capsule 25 mg  25 mg Oral Q6H PRN Jadapalle, Sree, MD       haloperidol  (HALDOL ) tablet 5 mg  5 mg Oral TID PRN Jadapalle, Sree, MD   5 mg at 11/24/23 1157   And   diphenhydrAMINE  (BENADRYL ) capsule 50 mg  50 mg Oral TID PRN Jadapalle, Sree, MD   50 mg at 11/24/23 1157   haloperidol  lactate (HALDOL ) injection 5 mg  5 mg Intramuscular TID PRN Jadapalle, Sree, MD       And   diphenhydrAMINE  (BENADRYL ) injection 50 mg  50 mg Intramuscular TID PRN Jadapalle, Sree, MD       And   LORazepam  (ATIVAN ) injection 2 mg  2 mg Intramuscular TID PRN Jadapalle, Sree, MD       haloperidol  lactate (HALDOL ) injection 10 mg  10 mg Intramuscular TID PRN Jadapalle, Sree, MD        And   diphenhydrAMINE  (BENADRYL ) injection 50 mg  50 mg Intramuscular TID PRN Jadapalle, Sree, MD       And   LORazepam  (ATIVAN ) injection 2 mg  2 mg Intramuscular TID PRN Jadapalle, Sree, MD       docusate sodium  (COLACE) capsule 100 mg  100 mg Oral BID Cleotilde Hoy HERO, NP   100 mg at 11/29/23 0759   ferrous sulfate  tablet 325 mg  325 mg Oral BID WC Jadapalle, Sree, MD   325 mg at 11/29/23 0759   gabapentin  (NEURONTIN ) capsule 300 mg  300 mg Oral QHS Jadapalle, Sree, MD   300 mg at 11/28/23 2143   hydrOXYzine  (ATARAX ) tablet 25 mg  25 mg Oral Q6H PRN Jadapalle, Sree, MD   25 mg at 11/25/23 1957   ibuprofen  (ADVIL ) tablet 600 mg  600 mg Oral Q6H Jadapalle, Sree, MD   600 mg at 11/29/23 0759   lithium  carbonate (LITHOBID ) ER tablet 600 mg  600 mg Oral Q12H Cleotilde Hoy HERO, NP   600 mg at 11/29/23 9240   lurasidone  (LATUDA ) tablet 20 mg  20 mg Oral Q supper Jadapalle, Sree, MD   20 mg at 11/28/23 1709   magnesium  hydroxide (MILK OF MAGNESIA) suspension 30 mL  30 mL Oral Daily PRN Donnelly Mellow, MD       ondansetron  (ZOFRAN ) tablet 4 mg  4 mg Oral Q8H PRN Jadapalle, Sree, MD       oxyCODONE  (Oxy IR/ROXICODONE ) immediate release tablet 5 mg  5 mg Oral Q6H PRN Jadapalle, Sree, MD   5 mg at 11/25/23 1437   prenatal multivitamin tablet 1 tablet  1 tablet Oral Q1200 Jadapalle, Sree, MD   1 tablet at 11/28/23 1243   senna-docusate (Senokot-S) tablet 2 tablet  2 tablet Oral Q24H Jadapalle, Sree, MD   2 tablet at 11/28/23 2142   simethicone  (MYLICON) chewable tablet 80 mg  80 mg Oral TID PC Jadapalle, Sree, MD   80 mg at 11/28/23 1709   simethicone  (MYLICON) chewable tablet 80 mg  80 mg Oral PRN Jadapalle, Sree, MD        Lab Results:  Results for orders placed or performed during the hospital encounter of 11/23/23 (from the past 48 hours)  Lipid panel     Status: None   Collection Time: 11/29/23  6:59 AM  Result Value Ref Range  Cholesterol 198 0 - 200 mg/dL   Triglycerides 892 <849  mg/dL   HDL 81 >59 mg/dL   Total CHOL/HDL Ratio 2.4 RATIO   VLDL 21 0 - 40 mg/dL   LDL Cholesterol 96 0 - 99 mg/dL    Comment:        Total Cholesterol/HDL:CHD Risk Coronary Heart Disease Risk Table                     Men   Women  1/2 Average Risk   3.4   3.3  Average Risk       5.0   4.4  2 X Average Risk   9.6   7.1  3 X Average Risk  23.4   11.0        Use the calculated Patient Ratio above and the CHD Risk Table to determine the patient's CHD Risk.        ATP III CLASSIFICATION (LDL):  <100     mg/dL   Optimal  899-870  mg/dL   Near or Above                    Optimal  130-159  mg/dL   Borderline  839-810  mg/dL   High  >809     mg/dL   Very High Performed at [facility], [address] [place], [place] [address]     Blood Alcohol level:  Lab Results  Component Value Date   ETH 29 (H) 09/12/2022    Metabolic Disorder Labs: Lab Results  Component Value Date   HGBA1C 4.6 (L) 02/12/2021   MPG 85.32 02/12/2021   No results found for: PROLACTIN Lab Results  Component Value Date   CHOL 198 11/29/2023   TRIG 107 11/29/2023   HDL 81 11/29/2023   CHOLHDL 2.4 11/29/2023   VLDL 21 11/29/2023   LDLCALC 96 11/29/2023   LDLCALC 76 02/12/2021      Psychiatric Specialty Exam: Appearance: Dressed appropriately, good hygiene Eye Contact: Good Speech: Normal rate, rhythm, and volume Mood: "I just want everything to be okay with my baby" Affect: Improved stability, mood congruent Thought Process: Linear and goal-directed Thought Content: Denies SI/HI, hallucinations, paranoia, or delusions Orientation: Alert and oriented to person, place, time, and situation Attention: Sustained Memory: Grossly intact Concentration: Adequate for interview Recall: Intact for recent and remote events Fund of Knowledge: Appropriate for age and education Language: Fluent, appropriate vocabulary Insight: Improving Judgment: Improving Psychomotor Activity: No agitation  or retardation observed Sleep: Reports good sleep Appetite: Reports eating well  Musculoskeletal: Strength & Muscle Tone: within normal limits Gait & Station: normal  Assets  Assets: Manufacturing systems engineer; Resilience; Social Support    Physical Exam: Physical Exam Vitals and nursing note reviewed.  HENT:     Head: Atraumatic.  Eyes:     Extraocular Movements: Extraocular movements intact.  Pulmonary:     Effort: Pulmonary effort is normal.  Neurological:     Mental Status: She is alert and oriented to person, place, and time.    Review of Systems  Psychiatric/Behavioral:  Negative for hallucinations, substance abuse and suicidal ideas. The patient is nervous/anxious. The patient does not have insomnia.    Blood pressure (!) 95/59, pulse 70, temperature 98.1 F (36.7 C), resp. rate 20, height 5' 6 (1.676 m), weight 61.2 kg, last menstrual period 03/26/2023, SpO2 100%, unknown if currently breastfeeding. Body mass index is 21.78 kg/m.  Diagnosis: Principal Problem:   MDD (major depressive  disorder), recurrent severe, without psychosis (HCC)   PLAN: Safety and Monitoring:  -- Voluntary admission to inpatient psychiatric unit for safety, stabilization and treatment  -- Daily contact with patient to assess and evaluate symptoms and progress in treatment  -- Patient's case to be discussed in multi-disciplinary team meeting  -- Observation Level : q15 minute checks  -- Vital signs:  q12 hours  -- Precautions: suicide, elopement, and assault -- Encouraged patient to participate in unit milieu and in scheduled group therapies  2. Psychiatric Diagnoses and Treatment:  Patient with history of bipolar disorder, borderline traits, recently delivered baby premature at 33 weeks, relapse on polysubstance use, wrote suicide letters addressing her family members and to the unborn child, found by accident by patient's family raised safety concerns.    Patient continues to show  improving insight and mood stabilization, particularly after seeing her newborn. While she remains emotionally labile, these symptoms are contextually appropriate and non-aggressive. She maintains hopeful outlook regarding CPS involvement and demonstrates protective maternal motivation. She reports past benefit from lithium  and expresses openness to medication reassessment.  Patient continues to require this acute inpatient psychiatric setting for safety, medication management, and symptom stabilization to address need for continued treatment.  Diagnosis: Bipolar Disorder Borderline Traits  Medications:  Lithium  600 mg BID, li recheck CMP CBC and TSH ordered as well 11/30/23 Latuda  20 mg daily-plan to titrated the dose Patient does not want to breast-feed at this time given the initiation of lithium  and also she reports not being comfortable pumping breastmilk in the hospital. -- The risks/benefits/side-effects/alternatives to this medication were discussed in detail with the patient and time was given for questions. The patient consents to medication trial.                -- Metabolic profile and EKG monitoring obtained while on an atypical antipsychotic (BMI: Lipid Panel: HbgA1c: QTc:)              -- Encouraged patient to participate in unit milieu and in scheduled group therapies                    3. Medical Issues Being Addressed: assist pt with post partum discharge instructions.     4. Discharge Planning:   -- Social work and case management to assist with discharge planning and identification of hospital follow-up needs prior to discharge  -- Estimated LOS: 3-4 days  [name] Right, PA-C 11/29/2023, 8:55 AM

## 2023-11-29 NOTE — Progress Notes (Signed)
   11/29/23 9370  Psychosocial Assessment  Patient Complaints Anxiety  Eye Contact Brief  Facial Expression Animated  Affect Angry  Speech Aggressive  Interaction Assertive  Motor Activity Fidgety  Appearance/Hygiene Bizarre  Behavior Characteristics Cooperative;Appropriate to situation  Mood Depressed  Thought Process  Coherency WDL  Content WDL  Delusions WDL  Hallucination None reported or observed  Judgment WDL  Confusion WDL

## 2023-11-30 DIAGNOSIS — O99323 Drug use complicating pregnancy, third trimester: Secondary | ICD-10-CM | POA: Diagnosis not present

## 2023-11-30 DIAGNOSIS — F319 Bipolar disorder, unspecified: Secondary | ICD-10-CM | POA: Diagnosis not present

## 2023-11-30 DIAGNOSIS — Z3A00 Weeks of gestation of pregnancy not specified: Secondary | ICD-10-CM | POA: Diagnosis not present

## 2023-11-30 DIAGNOSIS — Z419 Encounter for procedure for purposes other than remedying health state, unspecified: Secondary | ICD-10-CM | POA: Diagnosis not present

## 2023-11-30 LAB — CBC WITH DIFFERENTIAL/PLATELET
Abs Immature Granulocytes: 0.2 K/uL — ABNORMAL HIGH (ref 0.00–0.07)
Basophils Absolute: 0.1 K/uL (ref 0.0–0.1)
Basophils Relative: 1 %
Eosinophils Absolute: 0.3 K/uL (ref 0.0–0.5)
Eosinophils Relative: 4 %
HCT: 39.7 % (ref 36.0–46.0)
Hemoglobin: 13 g/dL (ref 12.0–15.0)
Immature Granulocytes: 3 %
Lymphocytes Relative: 27 %
Lymphs Abs: 2 K/uL (ref 0.7–4.0)
MCH: 29.6 pg (ref 26.0–34.0)
MCHC: 32.7 g/dL (ref 30.0–36.0)
MCV: 90.4 fL (ref 80.0–100.0)
Monocytes Absolute: 0.4 K/uL (ref 0.1–1.0)
Monocytes Relative: 5 %
Neutro Abs: 4.3 K/uL (ref 1.7–7.7)
Neutrophils Relative %: 60 %
Platelets: 243 K/uL (ref 150–400)
RBC: 4.39 MIL/uL (ref 3.87–5.11)
RDW: 13 % (ref 11.5–15.5)
WBC: 7.2 K/uL (ref 4.0–10.5)
nRBC: 0 % (ref 0.0–0.2)

## 2023-11-30 LAB — COMPREHENSIVE METABOLIC PANEL WITH GFR
ALT: 10 U/L (ref 0–44)
AST: 18 U/L (ref 15–41)
Albumin: 3.2 g/dL — ABNORMAL LOW (ref 3.5–5.0)
Alkaline Phosphatase: 83 U/L (ref 38–126)
Anion gap: 10 (ref 5–15)
BUN: 15 mg/dL (ref 6–20)
CO2: 21 mmol/L — ABNORMAL LOW (ref 22–32)
Calcium: 8.9 mg/dL (ref 8.9–10.3)
Chloride: 110 mmol/L (ref 98–111)
Creatinine, Ser: 0.76 mg/dL (ref 0.44–1.00)
GFR, Estimated: >60 mL/min (ref >60)
Glucose, Bld: 96 mg/dL (ref 70–99)
Potassium: 4.1 mmol/L (ref 3.5–5.1)
Sodium: 141 mmol/L (ref 135–145)
Total Bilirubin: 0.6 mg/dL (ref 0.0–1.2)
Total Protein: 6.3 g/dL — ABNORMAL LOW (ref 6.5–8.1)

## 2023-11-30 LAB — TSH: TSH: 1.44 u[IU]/mL (ref 0.350–4.500)

## 2023-11-30 LAB — LITHIUM LEVEL: Lithium Lvl: 0.56 mmol/L — ABNORMAL LOW (ref 0.60–1.20)

## 2023-11-30 LAB — HEMOGLOBIN A1C
Hgb A1c MFr Bld: 5 % (ref 4.8–5.6)
Mean Plasma Glucose: 97 mg/dL

## 2023-11-30 NOTE — Group Note (Signed)
 Recreation Therapy Group Note   Group Topic:General Recreation  Group Date: 11/30/2023 Start Time: 1000 End Time: 1110 Facilitators: Celestia Jeoffrey BRAVO, LRT, CTRS Location: Courtyard  Group Description: [employer]. LRT and patients played games of basketball, drew with chalk, and played corn hole while outside in the courtyard while getting fresh air and sunlight. Music was being played in the background. LRT and peers conversed about different games they have played before, what they do in their free time and anything else that is on their minds. LRT encouraged pts to drink water after being outside, sweating and getting their heart rate up.  Goal Area(s) Addressed: Patient will build on frustration tolerance skills. Patients will partake in a competitive play game with peers. Patients will gain knowledge of new leisure interest/hobby.    Affect/Mood: Appropriate   Participation Level: Active   Participation Quality: Independent   Behavior: Appropriate   Speech/Thought Process: Coherent   Insight: Good   Judgement: Good   Modes of Intervention: Activity   Patient Response to Interventions:  Receptive   Education Outcome:  Acknowledges education   Clinical Observations/Individualized Feedback: Tosca was active in their participation of session activities and group discussion. Pt interacted well with LRT and peers duration of session.    Plan: Continue to engage patient in RT group sessions 2-3x/week.   Jeoffrey BRAVO Celestia, LRT, CTRS 11/30/2023 11:46 AM

## 2023-11-30 NOTE — Group Note (Signed)
 Date:  11/30/2023 Time:  11:27 PM  Group Topic/Focus:  Wrap-Up Group:   The focus of this group is to help patients review their daily goal of treatment and discuss progress on daily workbooks.    Participation Level:  Minimal  Participation Quality:  Appropriate and Attentive  Affect:  Appropriate  Cognitive:  Appropriate  Insight: Appropriate  Engagement in Group:  Engaged  Modes of Intervention:  Discussion and Orientation  Additional Comments:     Arlester CHRISTELLA Servant 11/30/2023, 11:27 PM

## 2023-11-30 NOTE — Progress Notes (Signed)
 Occupational Therapy Treatment Patient Details Name: Deborah Mckee MRN: [id number] DOB: [birth year] Today's Date: 11/30/2023   History of present illness Pt is a 23 year old female G1P0101 at [redacted]w[redacted]d was admitted to the hospital 11/14/2023 for induction of labor. Patient had a labor course significant for pPROM at 34 weeks. The patient went for cesarean section due to fetal intolerance of labor. Patient had a postpartum course complicated by returning to the OR for incision repair.  Pregnancy has also been complicated by extensive mental health history including bipolar affective disorder, substance abuse (UDS positive for cocaine, THC, benzos), Chlamydia infection (recent treatment, TOC positive on admission), she was admitted to the [facility] on 11/24/23; PMH significant for bipolar disorder, borderline traits, relapse on polysubstance use, wrote suicide letters addressing her family members and to the unborn child, found by accident by patient's family raised safety concerns.   OT comments  Chart reviewed, pt greeted in room, appropriate interactions throughout visit. She reports she is mobilizing with minimal pain, reports pain regimen, reports she has been visiting her baby at care times and is appreciative of the unit staff and SCN staff. Good carry over of education from previous sessions. Provided patient additional post partum underwear as she reports she is almost out. Pt is left as received, all needs met. OT will follow.       If plan is discharge home, recommend the following:      Equipment Recommendations  None recommended by OT    Recommendations for Other Services      Precautions / Restrictions Precautions Precautions: None       Mobility Bed Mobility Overal bed mobility: Independent                  Transfers Overall transfer level: Independent                       Balance Overall balance assessment: Independent                                          ADL either performed or assessed with clinical judgement   ADL Overall ADL's : Modified independent                                            Extremity/Trunk Assessment              Vision       Perception     Praxis     Communication Communication Communication: No apparent difficulties   Cognition Arousal: Alert Behavior During Therapy: WFL for tasks assessed/performed Cognition: No apparent impairments             OT - Cognition Comments: improved situational awareness, is approrpiate throughout                 Following commands: Intact        Cueing   Cueing Techniques: Verbal cues  Exercises Other Exercises Other Exercises: edu re: incision care, mobilization techniques, ADL performance    Shoulder Instructions       General Comments      Pertinent Vitals/ Pain       Pain Assessment Pain Assessment: No/denies pain  Home Living  Prior Functioning/Environment              Frequency  Min 2X/week        Progress Toward Goals  OT Goals(current goals can now be found in the care plan section)  Progress towards OT goals: Progressing toward goals  Acute Rehab OT Goals Time For Goal Achievement: 12/09/23  Plan      Co-evaluation                 AM-PAC OT 6 Clicks Daily Activity     Outcome Measure   Help from another person eating meals?: None Help from another person taking care of personal grooming?: None Help from another person toileting, which includes using toliet, bedpan, or urinal?: None Help from another person bathing (including washing, rinsing, drying)?: None Help from another person to put on and taking off regular upper body clothing?: None Help from another person to put on and taking off regular lower body clothing?: None 6 Click Score: 24    End of Session    OT Visit Diagnosis: Other symptoms and  signs involving the nervous system (R29.898)   Activity Tolerance Patient tolerated treatment well   Patient Left in bed   Nurse Communication Mobility status        Time: 8891-8871 OT Time Calculation (min): 20 min  Charges: OT General Charges $OT Visit: 1 Visit OT Treatments $Self Care/Home Management : 8-22 mins  Therisa Sheffield, OTD OTR/L  11/30/23, 1:11 PM

## 2023-11-30 NOTE — BH IP Treatment Plan (Signed)
 Interdisciplinary Treatment and Diagnostic Plan Update  11/30/2023 Time of Session: 8:00 AM Deborah Mckee MRN: [id number]  Principal Diagnosis: MDD (major depressive disorder), recurrent severe, without psychosis (HCC)  Secondary Diagnoses: Principal Problem:   MDD (major depressive disorder), recurrent severe, without psychosis (HCC)   Current Medications:  Current Facility-Administered Medications  Medication Dose Route Frequency Provider Last Rate Last Admin   acetaminophen  (TYLENOL ) tablet 1,000 mg  1,000 mg Oral Q6H Jadapalle, Sree, MD   1,000 mg at 11/30/23 0811   acetaminophen  (TYLENOL ) tablet 650 mg  650 mg Oral Q6H PRN Donnelly Mellow, MD       alum & mag hydroxide-simeth (MAALOX/MYLANTA) 200-200-20 MG/5ML suspension 30 mL  30 mL Oral Q4H PRN Jadapalle, Sree, MD       bisacodyl  (DULCOLAX) suppository 10 mg  10 mg Rectal Daily PRN Jadapalle, Sree, MD       coconut oil  1 Application Topical PRN Jadapalle, Sree, MD       witch hazel-glycerin  (TUCKS) pad 1 Application  1 Application Topical PRN Jadapalle, Sree, MD       And   dibucaine (NUPERCAINAL) 1 % rectal ointment 1 Application  1 Application Rectal PRN Jadapalle, Sree, MD       diphenhydrAMINE  (BENADRYL ) capsule 25 mg  25 mg Oral Q6H PRN Jadapalle, Sree, MD       haloperidol  (HALDOL ) tablet 5 mg  5 mg Oral TID PRN Jadapalle, Sree, MD   5 mg at 11/24/23 1157   And   diphenhydrAMINE  (BENADRYL ) capsule 50 mg  50 mg Oral TID PRN Jadapalle, Sree, MD   50 mg at 11/24/23 1157   haloperidol  lactate (HALDOL ) injection 5 mg  5 mg Intramuscular TID PRN Jadapalle, Sree, MD       And   diphenhydrAMINE  (BENADRYL ) injection 50 mg  50 mg Intramuscular TID PRN Jadapalle, Sree, MD       And   LORazepam  (ATIVAN ) injection 2 mg  2 mg Intramuscular TID PRN Jadapalle, Sree, MD       haloperidol  lactate (HALDOL ) injection 10 mg  10 mg Intramuscular TID PRN Jadapalle, Sree, MD       And   diphenhydrAMINE  (BENADRYL ) injection 50 mg  50 mg  Intramuscular TID PRN Jadapalle, Sree, MD       And   LORazepam  (ATIVAN ) injection 2 mg  2 mg Intramuscular TID PRN Jadapalle, Sree, MD       docusate sodium  (COLACE) capsule 100 mg  100 mg Oral BID Cleotilde Hoy CHRISTELLA, NP   100 mg at 11/30/23 9188   ferrous sulfate  tablet 325 mg  325 mg Oral BID WC Jadapalle, Sree, MD   325 mg at 11/30/23 0810   gabapentin  (NEURONTIN ) capsule 300 mg  300 mg Oral QHS Jadapalle, Sree, MD   300 mg at 11/29/23 2115   hydrOXYzine  (ATARAX ) tablet 25 mg  25 mg Oral Q6H PRN Jadapalle, Sree, MD   25 mg at 11/25/23 1957   ibuprofen  (ADVIL ) tablet 600 mg  600 mg Oral Q6H Jadapalle, Sree, MD   600 mg at 11/30/23 0811   lithium  carbonate (LITHOBID ) ER tablet 600 mg  600 mg Oral Q12H Cleotilde Hoy CHRISTELLA, NP   600 mg at 11/30/23 9189   lurasidone  (LATUDA ) tablet 20 mg  20 mg Oral Q supper Jadapalle, Sree, MD   20 mg at 11/29/23 1724   magnesium  hydroxide (MILK OF MAGNESIA) suspension 30 mL  30 mL Oral Daily PRN Donnelly Mellow, MD  ondansetron  (ZOFRAN ) tablet 4 mg  4 mg Oral Q8H PRN Jadapalle, Sree, MD       oxyCODONE  (Oxy IR/ROXICODONE ) immediate release tablet 5 mg  5 mg Oral Q6H PRN Jadapalle, Sree, MD   5 mg at 11/25/23 1437   prenatal multivitamin tablet 1 tablet  1 tablet Oral Q1200 Jadapalle, Sree, MD   1 tablet at 11/29/23 1221   senna-docusate (Senokot-S) tablet 2 tablet  2 tablet Oral Q24H Jadapalle, Sree, MD   2 tablet at 11/29/23 2114   simethicone  (MYLICON) chewable tablet 80 mg  80 mg Oral TID PC Jadapalle, Sree, MD   80 mg at 11/30/23 9188   simethicone  (MYLICON) chewable tablet 80 mg  80 mg Oral PRN Jadapalle, Sree, MD       PTA Medications: Medications Prior to Admission  Medication Sig Dispense Refill Last Dose/Taking   acetaminophen  (TYLENOL ) 500 MG tablet Take 2 tablets (1,000 mg total) by mouth every 6 (six) hours as needed for mild pain (pain score 1-3) or fever. 30 tablet 0    [EXPIRED] cephALEXin  (KEFLEX ) 500 MG capsule Take 1 capsule (500 mg  total) by mouth every 12 (twelve) hours for 3 days. 6 capsule 0    ferrous sulfate  325 (65 FE) MG tablet Take 1 tablet (325 mg total) by mouth 2 (two) times daily with a meal. 60 tablet 3    ibuprofen  (ADVIL ) 600 MG tablet Take 1 tablet (600 mg total) by mouth every 6 (six) hours as needed for fever, mild pain (pain score 1-3) or cramping. 30 tablet 0    lithium  carbonate (LITHOBID ) 300 MG CR tablet 300 mg at bedtime. TAKE 1 TABLET BY MOUTH NIGHTLY (ALONG WITH 450 MG      lurasidone  (LATUDA ) 20 MG TABS tablet Take 1 tablet (20 mg total) by mouth daily with supper.      oxyCODONE  (OXY IR/ROXICODONE ) 5 MG immediate release tablet Take 1-2 tablets (5-10 mg total) by mouth every 4 (four) hours as needed for up to 7 days for moderate pain (pain score 4-6) or severe pain (pain score 7-10). 30 tablet 0    senna-docusate (SENOKOT-S) 8.6-50 MG tablet Take 2 tablets by mouth at bedtime as needed for mild constipation. 30 tablet 0    simethicone  (MYLICON) 80 MG chewable tablet Chew 1 tablet (80 mg total) by mouth 3 (three) times daily after meals. 30 tablet 0     Patient Stressors: Marital or family conflict   Substance abuse    Patient Strengths: Motivation for treatment/growth  Supportive family/friends   Treatment Modalities: Medication Management, Group therapy, Case management,  1 to 1 session with clinician, Psychoeducation, Recreational therapy.   Physician Treatment Plan for Primary Diagnosis: MDD (major depressive disorder), recurrent severe, without psychosis (HCC) Long Term Goal(s): Improvement in symptoms so as ready for discharge   Short Term Goals: Ability to identify changes in lifestyle to reduce recurrence of condition will improve Ability to verbalize feelings will improve Ability to disclose and discuss suicidal ideas Ability to demonstrate self-control will improve Ability to identify and develop effective coping behaviors will improve  Medication Management: Evaluate patient's  response, side effects, and tolerance of medication regimen.  Therapeutic Interventions: 1 to 1 sessions, Unit Group sessions and Medication administration.  Evaluation of Outcomes: Progressing  Physician Treatment Plan for Secondary Diagnosis: Principal Problem:   MDD (major depressive disorder), recurrent severe, without psychosis (HCC)  Long Term Goal(s): Improvement in symptoms so as ready for discharge   Short Term Goals:  Ability to identify changes in lifestyle to reduce recurrence of condition will improve Ability to verbalize feelings will improve Ability to disclose and discuss suicidal ideas Ability to demonstrate self-control will improve Ability to identify and develop effective coping behaviors will improve     Medication Management: Evaluate patient's response, side effects, and tolerance of medication regimen.  Therapeutic Interventions: 1 to 1 sessions, Unit Group sessions and Medication administration.  Evaluation of Outcomes: Progressing   RN Treatment Plan for Primary Diagnosis: MDD (major depressive disorder), recurrent severe, without psychosis (HCC) Long Term Goal(s): Knowledge of disease and therapeutic regimen to maintain health will improve  Short Term Goals: Ability to demonstrate self-control, Ability to participate in decision making will improve, Ability to verbalize feelings will improve, Ability to disclose and discuss suicidal ideas, Ability to identify and develop effective coping behaviors will improve, and Compliance with prescribed medications will improve  Medication Management: RN will administer medications as ordered by provider, will assess and evaluate patient's response and provide education to patient for prescribed medication. RN will report any adverse and/or side effects to prescribing provider.  Therapeutic Interventions: 1 on 1 counseling sessions, Psychoeducation, Medication administration, Evaluate responses to treatment, Monitor vital  signs and CBGs as ordered, Perform/monitor CIWA, COWS, AIMS and Fall Risk screenings as ordered, Perform wound care treatments as ordered.  Evaluation of Outcomes: Progressing   LCSW Treatment Plan for Primary Diagnosis: MDD (major depressive disorder), recurrent severe, without psychosis (HCC) Long Term Goal(s): Safe transition to appropriate next level of care at discharge, Engage patient in therapeutic group addressing interpersonal concerns.  Short Term Goals: Engage patient in aftercare planning with referrals and resources, Increase social support, Increase ability to appropriately verbalize feelings, Increase emotional regulation, Facilitate acceptance of mental health diagnosis and concerns, Facilitate patient progression through stages of change regarding substance use diagnoses and concerns, Identify triggers associated with mental health/substance abuse issues, and Increase skills for wellness and recovery  Therapeutic Interventions: Assess for all discharge needs, 1 to 1 time with Social worker, Explore available resources and support systems, Assess for adequacy in community support network, Educate family and significant other(s) on suicide prevention, Complete Psychosocial Assessment, Interpersonal group therapy.  Evaluation of Outcomes: Progressing   Progress in Treatment: Attending groups: Yes. Participating in groups: Yes. Taking medication as prescribed: Yes. Toleration medication: Yes. Family/Significant other contact made: Yes, individual(s) contacted:  SPE completed with the patient Patient understands diagnosis: Yes. Discussing patient identified problems/goals with staff: Yes. Medical problems stabilized or resolved: Yes. Denies suicidal/homicidal ideation: Yes. Issues/concerns per patient self-inventory: No. Other: none  New problem(s) identified: No, Describe:  none  Update 11/30/2023: No changes at this time.   New Short Term/Long Term Goal(s): detox,  elimination of symptoms of psychosis, medication management for mood stabilization; elimination of SI thoughts; development of comprehensive mental wellness/sobriety plan. Update 11/30/2023: No changes at this time.   Patient Goals:  get better and see my kid Update 11/30/2023: No changes at this time.   Discharge Plan or Barriers: CSW to assist in the development of appropriate discharge plans.  Update 11/30/2023: Pt reports plans to go to her home.  She reports that she will stay with her mother.  Appointments are in and transportation is not a barrier.   Reason for Continuation of Hospitalization: Anxiety Depression Medical Issues Medication stabilization Suicidal ideation   Estimated Length of Stay:  1-7 days Update 11/30/2023: TBD  Last 3 [place] Suicide Severity Risk Score: Flowsheet Row Admission (Current) from 11/23/2023 in [facility]  INPATIENT BEHAVIORAL MEDICINE ED to Hosp-Admission (Discharged) from 11/14/2023 in [facility] from 09/12/2022 in [facility]  C-SSRS RISK CATEGORY No Risk No Risk High Risk    Last PHQ 2/9 Scores:     No data to display          Scribe for Treatment Team: [name], KEN 11/30/2023 8:42 AM

## 2023-11-30 NOTE — Group Note (Signed)
 [facility] LCSW Group Therapy Note   Group Date: 11/30/2023 Start Time: 1300 End Time: 1345  Type of Therapy/Topic:  Group Therapy:  Feelings about Diagnosis  Participation Level:  Did Not Attend    Description of Group:    This group will allow patients to explore their thoughts and feelings about diagnoses they have received. Patients will be guided to explore their level of understanding and acceptance of these diagnoses. Facilitator will encourage patients to process their thoughts and feelings about the reactions of others to their diagnosis, and will guide patients in identifying ways to discuss their diagnosis with significant others in their lives. This group will be process-oriented, with patients participating in exploration of their own experiences as well as giving and receiving support and challenge from other group members.   Therapeutic Goals: 1. Patient will demonstrate understanding of diagnosis as evidence by identifying two or more symptoms of the disorder:  2. Patient will be able to express two feelings regarding the diagnosis 3. Patient will demonstrate ability to communicate their needs through discussion and/or role plays  Summary of Patient Progress: X   Therapeutic Modalities:   Cognitive Behavioral Therapy Brief Therapy Feelings Identification    Nadara JONELLE Fam, LCSW

## 2023-11-30 NOTE — Plan of Care (Signed)
   Problem: Education: Goal: Knowledge of [facility] Education information/materials will improve Outcome: Progressing Goal: Emotional status will improve Outcome: Progressing Goal: Mental status will improve Outcome: Progressing Goal: Verbalization of understanding the information provided will improve Outcome: Progressing

## 2023-11-30 NOTE — [facility] Counselor (Signed)
 CSW attempted to contact the patient's mother Deborah Mckee, [phone].  She reports no concerns for the patient being discharged.    She reports that she can tell a difference in patient's behavior in a positive way.  She reports that patient can come to her home. She reports that the patient does not have a key to her home.  She reports that she would rather she pick up the patient than have the patient take a cab.  She reports that she will provide transportation for the patient.   Sherryle Margo, MSW, LCSW 11/30/2023 3:12 PM

## 2023-11-30 NOTE — Group Note (Signed)
 Date:  11/30/2023 Time:  10:04 AM  Group Topic/Focus:  Goals Group:   The focus of this group is to help patients establish daily goals to achieve during treatment and discuss how the patient can incorporate goal setting into their daily lives to aide in recovery.    Participation Level:  Active  Participation Quality:  Appropriate  Affect:  Appropriate  Cognitive:  Appropriate  Insight: Appropriate  Engagement in Group:  Engaged  Modes of Intervention:  Discussion, Education, and Support  Additional Comments:    Deitra Caron [place] 11/30/2023, 10:04 AM

## 2023-11-30 NOTE — Progress Notes (Signed)
 [facility] MD Progress Note  11/30/2023 9:21 AM Deborah Mckee  MRN:  [id number]  Deborah Mckee is a 23 yo G1P0 female presenting at [redacted]w[redacted]d IUP with concerns for PPROM 2 days ago without preterm labor. Pregnancy has also been complicated by extensive mental health history including bipolar affective disorder, substance abuse (UDS positive for cocaine, THC, benzos), Chlamydia infection (recent treatment, TOC positive on admission). Plan is currently for monitoring, possible induction on/after 8/1 (102w0d). Patient reported one time use of cocaine following break up with bf they wrote suicide notes as they feared they were going to lose custody of the child Patient is admitted to adult psych unit with Q15 min safety monitoring. Multidisciplinary team approach is offered. Medication management; group/milieu therapy is offered.   Subjective:  Chart reviewed, case discussed in multidisciplinary meeting, patient seen during rounds.   8/12: Patient seen for follow-up. They are alert and oriented. They are pleasant and cooperative on exam.  Reviewed their lab work.  Discussed plan for discharge tomorrow.. They continue to deny SI/HI/AVH. They are linear and logical on exam. They are future oriented. Continue to note a plan to go live with their mother. Report stable appetite and sleep. They voice no concerns or complaints at this time. Affect is bright. They demonstrate good insight into the need for outpatient follow up and medication compliace. They visited the baby yesterday and plan to contiue to visit daily.   8/11 Patient seen for follow-up. They are alert and oriented. They are anxious and cooperative on exam. They want to know when they are going home. They continue to deny SI/HI/AVH. They are linear and logical on exam. They are future oriented. Continue to note a plan to go live with their mother. Report stable appetite and sleep. They voice no concerns or complaints at this time. Labs schedule for the AM. No med  adjustments.   11/28/23 Patient seen today for follow-up psychiatric evaluation. She has been rather insistent upon going home and becomes emotionally labile when it is suggested that her inpatient stay may be needed for fully titrating lithium . She was seen today with social work to review that her lithium  dose has been increased and that she will need to remain until a follow-up lithium  level can be obtained to ensure an appropriate therapeutic dose. Initially, she became tearful and anxious with this news; however, she was able to redirect and compose herself appropriately without inappropriate responses, showing improved insight, judgment, and mood maintenance. She continues to deny suicidal ideation (SI) and homicidal ideation (HI). She states, "I just want everything to be okay with my baby." She denies hallucinations, paranoia, or delusions. Reports eating and sleeping well.   11/27/23: Patient seen today for follow-up psychiatric evaluation. She greets this provider pleasantly and is initially engaged during the interview. She reports that she has now seen her baby and previously described improved mood overall, though she continues to experience emotional outbursts and crying spells that she feels are not aggressive or inappropriate given her current stressors. Today, she claims someone told her she will be discharging and becomes very labile when this provider explains the plan to titrate lithium  to a therapeutic dose. Lithium  level this morning is 0.2; we will need to increase lithium  to 600 mg twice daily. She continues to deny suicidal or homicidal ideation, denies hallucinations, paranoia, or delusions. She reports sleep is good and appetite is good. She remains focused on wanting to go home despite the need for continued stabilization and effective  medication management to ensure proper dosing prior to discharge.   11/26/23: Patient seen today for follow-up psychiatric evaluation. She greets  this provider pleasantly and is engaged during interview. She reports that she has now seen her baby and describes improved mood overall, though she continues to experience emotional outbursts and crying spells. She denies suicidal or homicidal ideation. She denies hallucinations, paranoia, or delusions. She describes the emotional lability as not aggressive or inappropriate given her current stressors. She reports having been stabilized in the past on lithium  600 mg per day.  Sleep: Good  Appetite:  Good  Past Psychiatric History: see h&P Family History: History reviewed. No pertinent family history. Social History:  Social History   Substance and Sexual Activity  Alcohol Use No     Social History   Substance and Sexual Activity  Drug Use Yes   Types: Marijuana, Cocaine   Comment: social    Social History   Socioeconomic History   Marital status: Single    Spouse name: Not on file   Number of children: Not on file   Years of education: Not on file   Highest education level: Not on file  Occupational History   Not on file  Tobacco Use   Smoking status: Every Day    Types: E-cigarettes   Smokeless tobacco: Never  Vaping Use   Vaping status: Every Day   Substances: Nicotine , Synthetic cannabinoids  Substance and Sexual Activity   Alcohol use: No   Drug use: Yes    Types: Marijuana, Cocaine    Comment: social   Sexual activity: Yes    Birth control/protection: None  Other Topics Concern   Not on file  Social History Narrative   Not on file   Social Drivers of Health   Financial Resource Strain: Low Risk  (05/05/2023)   Received from [facility]   Overall Financial Resource Strain (CARDIA)    Difficulty of Paying Living Expenses: Not hard at all  Food Insecurity: Food Insecurity Present (11/23/2023)   Hunger Vital Sign    Worried About Running Out of Food in the Last Year: Sometimes true    Ran Out of Food in the Last Year: Often true  Transportation Needs: No  Transportation Needs (11/23/2023)   PRAPARE - Administrator, Civil Service (Medical): No    Lack of Transportation (Non-Medical): No  Physical Activity: Not on file  Stress: Not on file  Social Connections: Not on file   Past Medical History:  Past Medical History:  Diagnosis Date   Anxiety    Chlamydia    Depression    History of cocaine abuse (HCC)    Vaccine for human papilloma virus (HPV) types 6, 11, 16, and 18 administered    Vision abnormalities    wears glasses    Past Surgical History:  Procedure Laterality Date   INGUINAL HERNIA PEDIATRIC WITH LAPAROSCOPIC EXAM Left 04/22/2017   Procedure: INGUINAL HERNIA PEDIATRIC WITH LAP LOOK OF PELVIC FLOOR;  Surgeon: Claudius Kaplan, MD;  Location: [facility];  Service: Pediatrics;  Laterality: Left;   INGUINAL HERNIA REPAIR Right 2008   LAPAROTOMY N/A 11/21/2023   Procedure: Revision of C Section Incision;  Surgeon: Verdon Keen, MD;  Location: [facility];  Service: Gynecology;  Laterality: N/A;    Current Medications: Current Facility-Administered Medications  Medication Dose Route Frequency Provider Last Rate Last Admin   acetaminophen  (TYLENOL ) tablet 1,000 mg  1,000 mg Oral Q6H Jadapalle, Sree, MD  1,000 mg at 11/30/23 9188   acetaminophen  (TYLENOL ) tablet 650 mg  650 mg Oral Q6H PRN Donnelly Mellow, MD       alum & mag hydroxide-simeth (MAALOX/MYLANTA) 200-200-20 MG/5ML suspension 30 mL  30 mL Oral Q4H PRN Jadapalle, Sree, MD       bisacodyl  (DULCOLAX) suppository 10 mg  10 mg Rectal Daily PRN Jadapalle, Sree, MD       coconut oil  1 Application Topical PRN Jadapalle, Sree, MD       witch hazel-glycerin  (TUCKS) pad 1 Application  1 Application Topical PRN Jadapalle, Sree, MD       And   dibucaine (NUPERCAINAL) 1 % rectal ointment 1 Application  1 Application Rectal PRN Jadapalle, Sree, MD       diphenhydrAMINE  (BENADRYL ) capsule 25 mg  25 mg Oral Q6H PRN Jadapalle, Sree, MD       haloperidol   (HALDOL ) tablet 5 mg  5 mg Oral TID PRN Jadapalle, Sree, MD   5 mg at 11/24/23 1157   And   diphenhydrAMINE  (BENADRYL ) capsule 50 mg  50 mg Oral TID PRN Jadapalle, Sree, MD   50 mg at 11/24/23 1157   haloperidol  lactate (HALDOL ) injection 5 mg  5 mg Intramuscular TID PRN Jadapalle, Sree, MD       And   diphenhydrAMINE  (BENADRYL ) injection 50 mg  50 mg Intramuscular TID PRN Jadapalle, Sree, MD       And   LORazepam  (ATIVAN ) injection 2 mg  2 mg Intramuscular TID PRN Jadapalle, Sree, MD       haloperidol  lactate (HALDOL ) injection 10 mg  10 mg Intramuscular TID PRN Jadapalle, Sree, MD       And   diphenhydrAMINE  (BENADRYL ) injection 50 mg  50 mg Intramuscular TID PRN Jadapalle, Sree, MD       And   LORazepam  (ATIVAN ) injection 2 mg  2 mg Intramuscular TID PRN Jadapalle, Sree, MD       docusate sodium  (COLACE) capsule 100 mg  100 mg Oral BID Cleotilde Hoy HERO, NP   100 mg at 11/30/23 9188   ferrous sulfate  tablet 325 mg  325 mg Oral BID WC Jadapalle, Sree, MD   325 mg at 11/30/23 0810   gabapentin  (NEURONTIN ) capsule 300 mg  300 mg Oral QHS Jadapalle, Sree, MD   300 mg at 11/29/23 2115   hydrOXYzine  (ATARAX ) tablet 25 mg  25 mg Oral Q6H PRN Jadapalle, Sree, MD   25 mg at 11/25/23 1957   ibuprofen  (ADVIL ) tablet 600 mg  600 mg Oral Q6H Jadapalle, Sree, MD   600 mg at 11/30/23 9188   lithium  carbonate (LITHOBID ) ER tablet 600 mg  600 mg Oral Q12H Cleotilde Hoy HERO, NP   600 mg at 11/30/23 9189   lurasidone  (LATUDA ) tablet 20 mg  20 mg Oral Q supper Jadapalle, Sree, MD   20 mg at 11/29/23 1724   magnesium  hydroxide (MILK OF MAGNESIA) suspension 30 mL  30 mL Oral Daily PRN Donnelly Mellow, MD       ondansetron  (ZOFRAN ) tablet 4 mg  4 mg Oral Q8H PRN Jadapalle, Sree, MD       oxyCODONE  (Oxy IR/ROXICODONE ) immediate release tablet 5 mg  5 mg Oral Q6H PRN Jadapalle, Sree, MD   5 mg at 11/25/23 1437   prenatal multivitamin tablet 1 tablet  1 tablet Oral Q1200 Jadapalle, Sree, MD   1 tablet at 11/29/23  1221   senna-docusate (Senokot-S) tablet 2 tablet  2 tablet Oral  Q24H Jadapalle, Sree, MD   2 tablet at 11/29/23 2114   simethicone  [facility]) chewable tablet 80 mg  80 mg Oral TID PC Jadapalle, Sree, MD   80 mg at 11/30/23 0811   simethicone  (MYLICON) chewable tablet 80 mg  80 mg Oral PRN Jadapalle, Sree, MD        Lab Results:  Results for orders placed or performed during the hospital encounter of 11/23/23 (from the past 48 hours)  Lipid panel     Status: None   Collection Time: 11/29/23  6:59 AM  Result Value Ref Range   Cholesterol 198 0 - 200 mg/dL   Triglycerides 892 <849 mg/dL   HDL 81 >59 mg/dL   Total CHOL/HDL Ratio 2.4 RATIO   VLDL 21 0 - 40 mg/dL   LDL Cholesterol 96 0 - 99 mg/dL    Comment:        Total Cholesterol/HDL:CHD Risk Coronary Heart Disease Risk Table                     Men   Women  1/2 Average Risk   3.4   3.3  Average Risk       5.0   4.4  2 X Average Risk   9.6   7.1  3 X Average Risk  23.4   11.0        Use the calculated Patient Ratio above and the CHD Risk Table to determine the patient's CHD Risk.        ATP III CLASSIFICATION (LDL):  <100     mg/dL   Optimal  899-870  mg/dL   Near or Above                    Optimal  130-159  mg/dL   Borderline  839-810  mg/dL   High  >809     mg/dL   Very High Performed at [facility], [address] [place], [place] [address]   Hemoglobin A1c     Status: None   Collection Time: 11/29/23  6:59 AM  Result Value Ref Range   Hgb A1c MFr Bld 5.0 4.8 - 5.6 %    Comment: (NOTE)         Prediabetes: 5.7 - 6.4         Diabetes: >6.4         Glycemic control for adults with diabetes: <7.0    Mean Plasma Glucose 97 mg/dL    Comment: (NOTE) Performed At: [facility] [address] [place], [place] [id number] Jennette Shorter MD Ey:[id number]   Lithium  level     Status: Abnormal   Collection Time: 11/30/23  7:13 AM  Result Value Ref Range   Lithium  Lvl 0.56 (L) 0.60 - 1.20 mmol/L     Comment: Performed at [facility], [address] [place], [place] [address]  Comprehensive metabolic panel     Status: Abnormal   Collection Time: 11/30/23  7:13 AM  Result Value Ref Range   Sodium 141 135 - 145 mmol/L   Potassium 4.1 3.5 - 5.1 mmol/L   Chloride 110 98 - 111 mmol/L   CO2 21 (L) 22 - 32 mmol/L   Glucose, Bld 96 70 - 99 mg/dL    Comment: Glucose reference range applies only to samples taken after fasting for at least 8 hours.   BUN 15 6 - 20 mg/dL   Creatinine, Ser 9.23 0.44 - 1.00 mg/dL   Calcium   8.9 8.9 - 10.3 mg/dL   Total Protein 6.3 (L) 6.5 - 8.1 g/dL   Albumin 3.2 (L) 3.5 - 5.0 g/dL   AST 18 15 - 41 U/L   ALT 10 0 - 44 U/L   Alkaline Phosphatase 83 38 - 126 U/L   Total Bilirubin 0.6 0.0 - 1.2 mg/dL   GFR, Estimated >39 >39 mL/min    Comment: (NOTE) Calculated using the CKD-EPI Creatinine Equation (2021)    Anion gap 10 5 - 15    Comment: Performed at [facility], [address] [place], [place] [address]  TSH     Status: None   Collection Time: 11/30/23  7:13 AM  Result Value Ref Range   TSH 1.440 0.350 - 4.500 uIU/mL    Comment: Performed by a 3rd Generation assay with a functional sensitivity of <=0.01 uIU/mL. Performed at [facility], [address] [place], [place] [address]   CBC with Differential/Platelet     Status: Abnormal   Collection Time: 11/30/23  7:13 AM  Result Value Ref Range   WBC 7.2 4.0 - 10.5 K/uL   RBC 4.39 3.87 - 5.11 MIL/uL   Hemoglobin 13.0 12.0 - 15.0 g/dL   HCT 60.2 63.9 - 53.9 %   MCV 90.4 80.0 - 100.0 fL   MCH 29.6 26.0 - 34.0 pg   MCHC 32.7 30.0 - 36.0 g/dL   RDW 86.9 88.4 - 84.4 %   Platelets 243 150 - 400 K/uL   nRBC 0.0 0.0 - 0.2 %   Neutrophils Relative % 60 %   Neutro Abs 4.3 1.7 - 7.7 K/uL   Lymphocytes Relative 27 %   Lymphs Abs 2.0 0.7 - 4.0 K/uL   Monocytes Relative 5 %   Monocytes Absolute 0.4 0.1 - 1.0 K/uL   Eosinophils Relative 4 %   Eosinophils Absolute 0.3 0.0 -  0.5 K/uL   Basophils Relative 1 %   Basophils Absolute 0.1 0.0 - 0.1 K/uL   Immature Granulocytes 3 %   Abs Immature Granulocytes 0.20 (H) 0.00 - 0.07 K/uL    Comment: Performed at [facility], [address] [place], [place] [address]    Blood Alcohol level:  Lab Results  Component Value Date   ETH 29 (H) 09/12/2022    Metabolic Disorder Labs: Lab Results  Component Value Date   HGBA1C 5.0 11/29/2023   MPG 97 11/29/2023   MPG 85.32 02/12/2021   No results found for: PROLACTIN Lab Results  Component Value Date   CHOL 198 11/29/2023   TRIG 107 11/29/2023   HDL 81 11/29/2023   CHOLHDL 2.4 11/29/2023   VLDL 21 11/29/2023   LDLCALC 96 11/29/2023   LDLCALC 76 02/12/2021      Psychiatric Specialty Exam: Appearance: Dressed appropriately, good hygiene Eye Contact: Good Speech: Normal rate, rhythm, and volume Mood: "I just want everything to be okay with my baby" Affect: Improved stability, mood congruent Thought Process: Linear and goal-directed Thought Content: Denies SI/HI, hallucinations, paranoia, or delusions Orientation: Alert and oriented to person, place, time, and situation Attention: Sustained Memory: Grossly intact Concentration: Adequate for interview Recall: Intact for recent and remote events Fund of Knowledge: Appropriate for age and education Language: Fluent, appropriate vocabulary Insight: Improving Judgment: Improving Psychomotor Activity: No agitation or retardation observed Sleep: Reports good sleep Appetite: Reports eating well  Musculoskeletal: Strength & Muscle Tone: within normal limits Gait & Station: normal  Assets  Assets: Manufacturing systems engineer; Desire for Improvement  Physical Exam: Physical Exam Vitals and nursing note reviewed.  HENT:     Head: Atraumatic.  Eyes:     Extraocular Movements: Extraocular movements intact.  Pulmonary:     Effort: Pulmonary effort is normal.  Neurological:     Mental Status:  She is alert and oriented to person, place, and time.  Psychiatric:        Mood and Affect: Mood normal.        Behavior: Behavior normal.        Thought Content: Thought content normal.        Judgment: Judgment normal.    Review of Systems  Psychiatric/Behavioral:  Negative for hallucinations, substance abuse and suicidal ideas. The patient is nervous/anxious. The patient does not have insomnia.    Blood pressure (!) 100/50, pulse (!) 57, temperature 97.6 F (36.4 C), resp. rate 16, height 5' 6 (1.676 m), weight 61.2 kg, last menstrual period 03/26/2023, SpO2 100%, unknown if currently breastfeeding. Body mass index is 21.78 kg/m.  Diagnosis: Principal Problem:   MDD (major depressive disorder), recurrent severe, without psychosis (HCC)   PLAN: Safety and Monitoring:  -- Voluntary admission to inpatient psychiatric unit for safety, stabilization and treatment  -- Daily contact with patient to assess and evaluate symptoms and progress in treatment  -- Patient's case to be discussed in multi-disciplinary team meeting  -- Observation Level : q15 minute checks  -- Vital signs:  q12 hours  -- Precautions: suicide, elopement, and assault -- Encouraged patient to participate in unit milieu and in scheduled group therapies  2. Psychiatric Diagnoses and Treatment:  Patient with history of bipolar disorder, borderline traits, recently delivered baby premature at 33 weeks, relapse on polysubstance use, wrote suicide letters addressing her family members and to the unborn child, found by accident by patient's family raised safety concerns.    Patient continues to show improving insight and mood stabilization, particularly after seeing her newborn. While she remains emotionally labile, these symptoms are contextually appropriate and non-aggressive. She maintains hopeful outlook regarding CPS involvement and demonstrates protective maternal motivation. She reports past benefit from lithium  and  expresses openness to medication reassessment.  Patient continues to require this acute inpatient psychiatric setting for safety, medication management, and symptom stabilization to address need for continued treatment.  Diagnosis: Bipolar Disorder Borderline Traits  Medications:  Lithium  600 mg BID, lithium  level was 0.56 TSH was within reference range.  CMP and CBC reviewed.  Hemoglobin A1c and lipid panel reviewed.  Labs were reviewed with patient Latuda  20 mg daily-plan to titrated the dose Patient does not want to breast-feed at this time given the initiation of lithium  and also she reports not being comfortable pumping breastmilk in the hospital. -- The risks/benefits/side-effects/alternatives to this medication were discussed in detail with the patient and time was given for questions. The patient consents to medication trial.                -- Metabolic profile and EKG monitoring obtained while on an atypical antipsychotic (BMI: Lipid Panel: HbgA1c: QTc:)              -- Encouraged patient to participate in unit milieu and in scheduled group therapies                    3. Medical Issues Being Addressed: assist pt with post partum discharge instructions.   Patient states they will call OB to schedule follow-up patient not require instructions at discharge.  4. Discharge  Planning:  Discharge tomorrow  -- Social work and case management to assist with discharge planning and identification of hospital follow-up needs prior to discharge  -- Estimated LOS: 3-4 days  [name] Right, PA-C 11/30/2023, 9:21 AM

## 2023-11-30 NOTE — Progress Notes (Signed)
   11/30/23 0400  Psych Admission Type (Psych Patients Only)  Admission Status Voluntary  Psychosocial Assessment  Patient Complaints Anxiety;Depression  Eye Contact Fair  Facial Expression Anxious;Sad  Affect Anxious  Speech Logical/coherent  Interaction Assertive  Motor Activity Slow  Appearance/Hygiene Unremarkable  Behavior Characteristics Cooperative  Mood Pleasant  Aggressive Behavior  Effect No apparent injury  Thought Process  Coherency WDL  Content WDL  Delusions None reported or observed  Perception WDL  Hallucination None reported or observed  Judgment WDL  Confusion None  Danger to Self  Current suicidal ideation? Denies  Agreement Not to Harm Self Yes  Danger to Others  Danger to Others None reported or observed

## 2023-11-30 NOTE — Group Note (Signed)
 Recreation Therapy Group Note   Group Topic:Coping Skills  Group Date: 11/30/2023 Start Time: 1530 End Time: 1620 Facilitators: Celestia Jeoffrey FORBES ARTICE, CTRS Location: Craft Room  Group Description: Coping A-Z. LRT and patients engage in a guided discussion on what coping skills are and gave specific examples. LRT passed out a handout labeled Coping A-Z with blank spaces beside each letter. LRT prompted patients to come up with a coping skill for each of the letters. LRT and patients went over the handout and gave ideas for each letter if anyone had any blanks left on their paper. Patients kept this handout with them that listed 26 different coping skills.   Goal Area(s) Addressed: Patients will be able to define "coping skills". Patient will identify new coping skills.  Patient will increase communication.   Affect/Mood: Appropriate   Participation Level: Minimal    Clinical Observations/Individualized Feedback: Patient came to group with 10 minutes left.   Plan: Continue to engage patient in RT group sessions 2-3x/week.   Jeoffrey FORBES Celestia, LRT, CTRS 11/30/2023 4:51 PM

## 2023-12-01 DIAGNOSIS — F1410 Cocaine abuse, uncomplicated: Secondary | ICD-10-CM

## 2023-12-01 DIAGNOSIS — Z3A00 Weeks of gestation of pregnancy not specified: Secondary | ICD-10-CM | POA: Diagnosis not present

## 2023-12-01 DIAGNOSIS — O99323 Drug use complicating pregnancy, third trimester: Secondary | ICD-10-CM | POA: Diagnosis not present

## 2023-12-01 DIAGNOSIS — F319 Bipolar disorder, unspecified: Secondary | ICD-10-CM | POA: Diagnosis not present

## 2023-12-01 DIAGNOSIS — F1290 Cannabis use, unspecified, uncomplicated: Secondary | ICD-10-CM

## 2023-12-01 MED ORDER — LURASIDONE HCL 20 MG PO TABS: 20.0000 mg | ORAL_TABLET | Freq: Every day | ORAL | 0 refills | Status: AC

## 2023-12-01 MED ORDER — LITHIUM CARBONATE ER 300 MG PO TBCR: 600.0000 mg | EXTENDED_RELEASE_TABLET | Freq: Two times a day (BID) | ORAL | 0 refills | Status: AC

## 2023-12-01 NOTE — Plan of Care (Signed)
   Problem: Education: Goal: Knowledge of [facility] Education information/materials will improve Outcome: Progressing Goal: Emotional status will improve Outcome: Progressing Goal: Mental status will improve Outcome: Progressing Goal: Verbalization of understanding the information provided will improve Outcome: Progressing

## 2023-12-01 NOTE — Progress Notes (Signed)
 [facility] MD Progress Note  12/01/2023 11:35 AM Deborah Mckee  MRN:  [id number]  Deborah Mckee is a 23 yo G1P0 female presenting at [redacted]w[redacted]d IUP with concerns for PPROM 2 days ago without preterm labor. Pregnancy has also been complicated by extensive mental health history including bipolar affective disorder, substance abuse (UDS positive for cocaine, THC, benzos), Chlamydia infection (recent treatment, TOC positive on admission). Plan is currently for monitoring, possible induction on/after 8/1 ([redacted]w[redacted]d). Patient reported one time use of cocaine following break up with bf they wrote suicide notes as they feared they were going to lose custody of the child Patient is admitted to adult psych unit with Q15 min safety monitoring. Multidisciplinary team approach is offered. Medication management; group/milieu therapy is offered.   Subjective:  Chart reviewed, case discussed in multidisciplinary meeting, patient seen during rounds.  8/13: Patient seen for follow-up.  They are alert and oriented.  They are pleasant and cooperative.  They are linear logical and future oriented.  They deny SI, HI, and AVH.  They have been medication compliant.  They did not require behavioral PRNs.  They met with DSS yesterday.  They plan to discharge to their mother's house.  They plan to return to the hospital daily to see the baby that is still in the NICU.  They feel they have a good support system between mother and friends.  They have consistently denied SI and no self-harm behavior has been observed.  They are able to discuss their coping mechanisms.  They have outpatient follow-up for psychiatry and psychotherapy.  They are agreeable with discharge plan.  They voiced no concerns or complaints.  Social work team spoke with mother who will pick patient up at time of discharge.  Patient is slated for discharge today.  No medication adjustments.  Patient's pharmacy was verified with them and medications were sent.  8/12: Patient seen  for follow-up. They are alert and oriented. They are pleasant and cooperative on exam.  Reviewed their lab work.  Discussed plan for discharge tomorrow.. They continue to deny SI/HI/AVH. They are linear and logical on exam. They are future oriented. Continue to note a plan to go live with their mother. Report stable appetite and sleep. They voice no concerns or complaints at this time. Affect is bright. They demonstrate good insight into the need for outpatient follow up and medication compliace. They visited the baby yesterday and plan to contiue to visit daily.   8/11 Patient seen for follow-up. They are alert and oriented. They are anxious and cooperative on exam. They want to know when they are going home. They continue to deny SI/HI/AVH. They are linear and logical on exam. They are future oriented. Continue to note a plan to go live with their mother. Report stable appetite and sleep. They voice no concerns or complaints at this time. Labs schedule for the AM. No med adjustments.   11/28/23 Patient seen today for follow-up psychiatric evaluation. She has been rather insistent upon going home and becomes emotionally labile when it is suggested that her inpatient stay may be needed for fully titrating lithium . She was seen today with social work to review that her lithium  dose has been increased and that she will need to remain until a follow-up lithium  level can be obtained to ensure an appropriate therapeutic dose. Initially, she became tearful and anxious with this news; however, she was able to redirect and compose herself appropriately without inappropriate responses, showing improved insight, judgment, and mood maintenance.  She continues to deny suicidal ideation (SI) and homicidal ideation (HI). She states, "I just want everything to be okay with my baby." She denies hallucinations, paranoia, or delusions. Reports eating and sleeping well.   11/27/23: Patient seen today for follow-up psychiatric  evaluation. She greets this provider pleasantly and is initially engaged during the interview. She reports that she has now seen her baby and previously described improved mood overall, though she continues to experience emotional outbursts and crying spells that she feels are not aggressive or inappropriate given her current stressors. Today, she claims someone told her she will be discharging and becomes very labile when this provider explains the plan to titrate lithium  to a therapeutic dose. Lithium  level this morning is 0.2; we will need to increase lithium  to 600 mg twice daily. She continues to deny suicidal or homicidal ideation, denies hallucinations, paranoia, or delusions. She reports sleep is good and appetite is good. She remains focused on wanting to go home despite the need for continued stabilization and effective medication management to ensure proper dosing prior to discharge.   11/26/23: Patient seen today for follow-up psychiatric evaluation. She greets this provider pleasantly and is engaged during interview. She reports that she has now seen her baby and describes improved mood overall, though she continues to experience emotional outbursts and crying spells. She denies suicidal or homicidal ideation. She denies hallucinations, paranoia, or delusions. She describes the emotional lability as not aggressive or inappropriate given her current stressors. She reports having been stabilized in the past on lithium  600 mg per day.  Sleep: Good  Appetite:  Good  Past Psychiatric History: see h&P Family History: History reviewed. No pertinent family history. Social History:  Social History   Substance and Sexual Activity  Alcohol Use No     Social History   Substance and Sexual Activity  Drug Use Yes   Types: Marijuana, Cocaine   Comment: social    Social History   Socioeconomic History   Marital status: Single    Spouse name: Not on file   Number of children: Not on file    Years of education: Not on file   Highest education level: Not on file  Occupational History   Not on file  Tobacco Use   Smoking status: Every Day    Types: E-cigarettes   Smokeless tobacco: Never  Vaping Use   Vaping status: Every Day   Substances: Nicotine , Synthetic cannabinoids  Substance and Sexual Activity   Alcohol use: No   Drug use: Yes    Types: Marijuana, Cocaine    Comment: social   Sexual activity: Yes    Birth control/protection: None  Other Topics Concern   Not on file  Social History Narrative   Not on file   Social Drivers of Health   Financial Resource Strain: Low Risk  (05/05/2023)   Received from [facility]   Overall Financial Resource Strain (CARDIA)    Difficulty of Paying Living Expenses: Not hard at all  Food Insecurity: Food Insecurity Present (11/23/2023)   Hunger Vital Sign    Worried About Running Out of Food in the Last Year: Sometimes true    Ran Out of Food in the Last Year: Often true  Transportation Needs: No Transportation Needs (11/23/2023)   PRAPARE - Administrator, Civil Service (Medical): No    Lack of Transportation (Non-Medical): No  Physical Activity: Not on file  Stress: Not on file  Social Connections: Not on file  Past Medical History:  Past Medical History:  Diagnosis Date   Anxiety    Chlamydia    Depression    History of cocaine abuse (HCC)    Vaccine for human papilloma virus (HPV) types 6, 11, 16, and 18 administered    Vision abnormalities    wears glasses    Past Surgical History:  Procedure Laterality Date   INGUINAL HERNIA PEDIATRIC WITH LAPAROSCOPIC EXAM Left 04/22/2017   Procedure: INGUINAL HERNIA PEDIATRIC WITH LAP LOOK OF PELVIC FLOOR;  Surgeon: Claudius Kaplan, MD;  Location: [facility];  Service: Pediatrics;  Laterality: Left;   INGUINAL HERNIA REPAIR Right 2008   LAPAROTOMY N/A 11/21/2023   Procedure: Revision of C Section Incision;  Surgeon: Verdon Keen, MD;  Location:  [facility];  Service: Gynecology;  Laterality: N/A;    Current Medications: Current Facility-Administered Medications  Medication Dose Route Frequency Provider Last Rate Last Admin   acetaminophen  (TYLENOL ) tablet 650 mg  650 mg Oral Q6H PRN Jadapalle, Sree, MD       alum & mag hydroxide-simeth (MAALOX/MYLANTA) 200-200-20 MG/5ML suspension 30 mL  30 mL Oral Q4H PRN Jadapalle, Sree, MD       coconut oil  1 Application Topical PRN Jadapalle, Sree, MD       witch hazel-glycerin  (TUCKS) pad 1 Application  1 Application Topical PRN Jadapalle, Sree, MD       And   dibucaine (NUPERCAINAL) 1 % rectal ointment 1 Application  1 Application Rectal PRN Jadapalle, Sree, MD       diphenhydrAMINE  (BENADRYL ) capsule 25 mg  25 mg Oral Q6H PRN Jadapalle, Sree, MD       haloperidol  (HALDOL ) tablet 5 mg  5 mg Oral TID PRN Jadapalle, Sree, MD   5 mg at 11/24/23 1157   And   diphenhydrAMINE  (BENADRYL ) capsule 50 mg  50 mg Oral TID PRN Jadapalle, Sree, MD   50 mg at 11/24/23 1157   haloperidol  lactate (HALDOL ) injection 5 mg  5 mg Intramuscular TID PRN Jadapalle, Sree, MD       And   diphenhydrAMINE  (BENADRYL ) injection 50 mg  50 mg Intramuscular TID PRN Jadapalle, Sree, MD       And   LORazepam  (ATIVAN ) injection 2 mg  2 mg Intramuscular TID PRN Jadapalle, Sree, MD       haloperidol  lactate (HALDOL ) injection 10 mg  10 mg Intramuscular TID PRN Jadapalle, Sree, MD       And   diphenhydrAMINE  (BENADRYL ) injection 50 mg  50 mg Intramuscular TID PRN Jadapalle, Sree, MD       And   LORazepam  (ATIVAN ) injection 2 mg  2 mg Intramuscular TID PRN Jadapalle, Sree, MD       ferrous sulfate  tablet 325 mg  325 mg Oral BID WC Jadapalle, Sree, MD   325 mg at 12/01/23 0806   gabapentin  (NEURONTIN ) capsule 300 mg  300 mg Oral QHS Jadapalle, Sree, MD   300 mg at 11/30/23 2110   hydrOXYzine  (ATARAX ) tablet 25 mg  25 mg Oral Q6H PRN Jadapalle, Sree, MD   25 mg at 11/25/23 1957   ibuprofen  (ADVIL ) tablet 600 mg  600 mg Oral Q6H  Jadapalle, Sree, MD   600 mg at 12/01/23 0806   lithium  carbonate (LITHOBID ) ER tablet 600 mg  600 mg Oral Q12H Cleotilde Hoy HERO, NP   600 mg at 12/01/23 9193   lurasidone  (LATUDA ) tablet 20 mg  20 mg Oral Q supper Jadapalle, Sree, MD  20 mg at 11/30/23 1811   magnesium  hydroxide (MILK OF MAGNESIA) suspension 30 mL  30 mL Oral Daily PRN Donnelly Mellow, MD       ondansetron  (ZOFRAN ) tablet 4 mg  4 mg Oral Q8H PRN Jadapalle, Sree, MD       prenatal multivitamin tablet 1 tablet  1 tablet Oral Q1200 Jadapalle, Sree, MD   1 tablet at 11/30/23 1214   simethicone  (MYLICON) chewable tablet 80 mg  80 mg Oral TID PC Jadapalle, Sree, MD   80 mg at 12/01/23 9193    Lab Results:  Results for orders placed or performed during the hospital encounter of 11/23/23 (from the past 48 hours)  Lithium  level     Status: Abnormal   Collection Time: 11/30/23  7:13 AM  Result Value Ref Range   Lithium  Lvl 0.56 (L) 0.60 - 1.20 mmol/L    Comment: Performed at [facility], [address], [place], [place] [address]  Comprehensive metabolic panel     Status: Abnormal   Collection Time: 11/30/23  7:13 AM  Result Value Ref Range   Sodium 141 135 - 145 mmol/L   Potassium 4.1 3.5 - 5.1 mmol/L   Chloride 110 98 - 111 mmol/L   CO2 21 (L) 22 - 32 mmol/L   Glucose, Bld 96 70 - 99 mg/dL    Comment: Glucose reference range applies only to samples taken after fasting for at least 8 hours.   BUN 15 6 - 20 mg/dL   Creatinine, Ser 9.23 0.44 - 1.00 mg/dL   Calcium  8.9 8.9 - 10.3 mg/dL   Total Protein 6.3 (L) 6.5 - 8.1 g/dL   Albumin 3.2 (L) 3.5 - 5.0 g/dL   AST 18 15 - 41 U/L   ALT 10 0 - 44 U/L   Alkaline Phosphatase 83 38 - 126 U/L   Total Bilirubin 0.6 0.0 - 1.2 mg/dL   GFR, Estimated >39 >39 mL/min    Comment: (NOTE) Calculated using the CKD-EPI Creatinine Equation (2021)    Anion gap 10 5 - 15    Comment: Performed at [facility], [address] [place], [place] [address]  TSH     Status:  None   Collection Time: 11/30/23  7:13 AM  Result Value Ref Range   TSH 1.440 0.350 - 4.500 uIU/mL    Comment: Performed by a 3rd Generation assay with a functional sensitivity of <=0.01 uIU/mL. Performed at [facility], [address] [place], [place] [address]   CBC with Differential/Platelet     Status: Abnormal   Collection Time: 11/30/23  7:13 AM  Result Value Ref Range   WBC 7.2 4.0 - 10.5 K/uL   RBC 4.39 3.87 - 5.11 MIL/uL   Hemoglobin 13.0 12.0 - 15.0 g/dL   HCT 60.2 63.9 - 53.9 %   MCV 90.4 80.0 - 100.0 fL   MCH 29.6 26.0 - 34.0 pg   MCHC 32.7 30.0 - 36.0 g/dL   RDW 86.9 88.4 - 84.4 %   Platelets 243 150 - 400 K/uL   nRBC 0.0 0.0 - 0.2 %   Neutrophils Relative % 60 %   Neutro Abs 4.3 1.7 - 7.7 K/uL   Lymphocytes Relative 27 %   Lymphs Abs 2.0 0.7 - 4.0 K/uL   Monocytes Relative 5 %   Monocytes Absolute 0.4 0.1 - 1.0 K/uL   Eosinophils Relative 4 %   Eosinophils Absolute 0.3 0.0 - 0.5 K/uL   Basophils Relative 1 %  Basophils Absolute 0.1 0.0 - 0.1 K/uL   Immature Granulocytes 3 %   Abs Immature Granulocytes 0.20 (H) 0.00 - 0.07 K/uL    Comment: Performed at [facility], [address] [place], [place] [address]    Blood Alcohol level:  Lab Results  Component Value Date   ETH 29 (H) 09/12/2022    Metabolic Disorder Labs: Lab Results  Component Value Date   HGBA1C 5.0 11/29/2023   MPG 97 11/29/2023   MPG 85.32 02/12/2021   No results found for: PROLACTIN Lab Results  Component Value Date   CHOL 198 11/29/2023   TRIG 107 11/29/2023   HDL 81 11/29/2023   CHOLHDL 2.4 11/29/2023   VLDL 21 11/29/2023   LDLCALC 96 11/29/2023   LDLCALC 76 02/12/2021      Psychiatric Specialty Exam: Appearance: Dressed appropriately, good hygiene Eye Contact: Good Speech: Normal rate, rhythm, and volume Mood: "I just want everything to be okay with my baby" Affect: Improved stability, mood congruent Thought Process: Linear and  goal-directed Thought Content: Denies SI/HI, hallucinations, paranoia, or delusions Orientation: Alert and oriented to person, place, time, and situation Attention: Sustained Memory: Grossly intact Concentration: Adequate for interview Recall: Intact for recent and remote events Fund of Knowledge: Appropriate for age and education Language: Fluent, appropriate vocabulary Insight: Improving Judgment: Improving Psychomotor Activity: No agitation or retardation observed Sleep: Reports good sleep Appetite: Reports eating well  Musculoskeletal: Strength & Muscle Tone: within normal limits Gait & Station: normal  Assets  Assets: Manufacturing systems engineer; Desire for Improvement    Physical Exam: Physical Exam Vitals and nursing note reviewed.  HENT:     Head: Atraumatic.  Eyes:     Extraocular Movements: Extraocular movements intact.  Pulmonary:     Effort: Pulmonary effort is normal.  Neurological:     Mental Status: She is alert and oriented to person, place, and time.  Psychiatric:        Mood and Affect: Mood normal.        Behavior: Behavior normal.        Thought Content: Thought content normal.        Judgment: Judgment normal.    Review of Systems  Psychiatric/Behavioral:  Negative for hallucinations, substance abuse and suicidal ideas. The patient is not nervous/anxious and does not have insomnia.    Blood pressure 109/71, pulse 67, temperature (!) 97.1 F (36.2 C), resp. rate 19, height 5' 6 (1.676 m), weight 61.2 kg, last menstrual period 03/26/2023, SpO2 100%, unknown if currently breastfeeding. Body mass index is 21.78 kg/m.  Diagnosis: Principal Problem:   MDD (major depressive disorder), recurrent severe, without psychosis (HCC)   PLAN: Safety and Monitoring:  -- Voluntary admission to inpatient psychiatric unit for safety, stabilization and treatment  -- Daily contact with patient to assess and evaluate symptoms and progress in treatment  -- Patient's  case to be discussed in multi-disciplinary team meeting  -- Observation Level : q15 minute checks  -- Vital signs:  q12 hours  -- Precautions: suicide, elopement, and assault -- Encouraged patient to participate in unit milieu and in scheduled group therapies  2. Psychiatric Diagnoses and Treatment:  Patient with history of bipolar disorder, borderline traits, recently delivered baby premature at 33 weeks, relapse on polysubstance use, wrote suicide letters addressing her family members and to the unborn child, found by accident by patient's family raised safety concerns.    Patient continues to show improving insight and mood stabilization, particularly after seeing her newborn. While  she remains emotionally labile, these symptoms are contextually appropriate and non-aggressive. She maintains hopeful outlook regarding CPS involvement and demonstrates protective maternal motivation. She reports past benefit from lithium  and expresses openness to medication reassessment.  Patient continues to require this acute inpatient psychiatric setting for safety, medication management, and symptom stabilization to address need for continued treatment.  Diagnosis: Bipolar Disorder Borderline Traits  Medications:  Lithium  600 mg BID, lithium  level was 0.56 TSH was within reference range.  CMP and CBC reviewed.  Hemoglobin A1c and lipid panel reviewed.  Labs were reviewed with patient Latuda  20 mg daily-plan to titrated the dose Patient does not want to breast-feed at this time given the initiation of lithium  and also she reports not being comfortable pumping breastmilk in the hospital. -- The risks/benefits/side-effects/alternatives to this medication were discussed in detail with the patient and time was given for questions. The patient consents to medication trial.                -- Metabolic profile and EKG monitoring obtained while on an atypical antipsychotic (BMI: Lipid Panel: HbgA1c: QTc:)               -- Encouraged patient to participate in unit milieu and in scheduled group therapies                    3. Medical Issues Being Addressed: assist pt with post partum discharge instructions.   Patient states they will call OB to schedule follow-up patient not require instructions at discharge.  4. Discharge Planning:  Discharge tomorrow  -- Social work and case management to assist with discharge planning and identification of hospital follow-up needs prior to discharge  -- Estimated LOS: 3-4 days  [name] Right, PA-C 12/01/2023, 11:35 AM

## 2023-12-01 NOTE — Progress Notes (Signed)
   12/01/23 1100  Psych Admission Type (Psych Patients Only)  Admission Status Voluntary  Psychosocial Assessment  Patient Complaints None  Eye Contact Fair  Facial Expression Anxious;Sad  Affect Anxious  Speech Logical/coherent  Interaction Assertive  Motor Activity Slow  Appearance/Hygiene Unremarkable  Behavior Characteristics Cooperative;Appropriate to situation  Mood Pleasant  Thought Process  Coherency WDL  Content WDL  Delusions None reported or observed  Perception WDL  Hallucination None reported or observed  Judgment WDL  Confusion None  Danger to Self  Current suicidal ideation? Denies  Self-Injurious Behavior No self-injurious ideation or behavior indicators observed or expressed   Agreement Not to Harm Self Yes

## 2023-12-01 NOTE — Group Note (Signed)
 LCSW Group Therapy Note   Group Date: 12/01/2023 Start Time: 1300 End Time: 1400   Type of Therapy and Topic:  Group Therapy: Challenging Core Beliefs  Participation Level:  Did Not Attend  Description of Group:  Patients were educated about core beliefs and asked to identify one harmful core belief that they have. Patients were asked to explore from where those beliefs originate. Patients were asked to discuss how those beliefs make them feel and the resulting behaviors of those beliefs. They were then be asked if those beliefs are true and, if so, what evidence they have to support them. Lastly, group members were challenged to replace those negative core beliefs with helpful beliefs.   Therapeutic Goals:   1. Patient will identify harmful core beliefs and explore the origins of such beliefs. 2. Patient will identify feelings and behaviors that result from those core beliefs. 3. Patient will discuss whether such beliefs are true. 4.  Patient will replace harmful core beliefs with helpful ones.  Summary of Patient Progress:  Patient did not attend.   Therapeutic Modalities: Cognitive Behavioral Therapy; Solution-Focused Therapy   Caidon Foti M Maryna Yeagle, [place] 12/01/2023  3:06 PM

## 2023-12-01 NOTE — Progress Notes (Signed)
   11/30/23 2314  Psych Admission Type (Psych Patients Only)  Admission Status Voluntary  Psychosocial Assessment  Patient Complaints None  Eye Contact Fair  Facial Expression Anxious;Sad  Affect Anxious  Speech Logical/coherent  Interaction Assertive  Motor Activity Slow  Appearance/Hygiene Unremarkable  Behavior Characteristics Cooperative;Appropriate to situation  Mood Pleasant  Aggressive Behavior  Effect No apparent injury  Thought Process  Coherency WDL  Content WDL  Delusions None reported or observed  Perception WDL  Hallucination None reported or observed  Judgment WDL  Confusion None  Danger to Self  Current suicidal ideation? Denies  Self-Injurious Behavior No self-injurious ideation or behavior indicators observed or expressed   Agreement Not to Harm Self Yes  Danger to Others  Danger to Others None reported or observed

## 2023-12-01 NOTE — BHH Suicide Risk Assessment (Signed)
 [facility] Discharge Suicide Risk Assessment   Principal Problem: MDD (major depressive disorder), recurrent severe, without psychosis (HCC) Discharge Diagnoses: Principal Problem:   MDD (major depressive disorder), recurrent severe, without psychosis (HCC)   Total Time spent with patient: 1 hour  Musculoskeletal: Strength & Muscle Tone: within normal limits Gait & Station: normal Patient leans: N/A  Psychiatric Specialty Exam  Presentation  General Appearance:  Casual  Eye Contact: Fair  Speech: Clear and Coherent  Speech Volume: Normal  Handedness: Right   Mood and Affect  Mood: Euthymic  Duration of Depression Symptoms: No data recorded Affect: Congruent   Thought Process  Thought Processes: Coherent; Linear  Descriptions of Associations:Intact  Orientation:Full (Time, Place and Person)  Thought Content:WDL  History of Schizophrenia/Schizoaffective disorder:No data recorded Duration of Psychotic Symptoms:No data recorded Hallucinations:Hallucinations: None  Ideas of Reference:None  Suicidal Thoughts:Suicidal Thoughts: No  Homicidal Thoughts:Homicidal Thoughts: No   Sensorium  Memory: Immediate Fair; Recent Fair  Judgment: Good  Insight: Good   Executive Functions  Concentration: Good  Attention Span: Good  Recall: Good  Fund of Knowledge: Good  Language: Good   Psychomotor Activity  Psychomotor Activity: Psychomotor Activity: Normal   Assets  Assets: Communication Skills; Desire for Improvement   Sleep  Sleep: Sleep: Good  Estimated Sleeping Duration (Last 24 Hours): 8.75-9.50 hours  Physical Exam: Physical Exam Vitals and nursing note reviewed.  HENT:     Head: Atraumatic.  Eyes:     Extraocular Movements: Extraocular movements intact.  Pulmonary:     Effort: Pulmonary effort is normal.  Neurological:     Mental Status: She is alert and oriented to person, place, and time.    Review of Systems   Psychiatric/Behavioral:  Positive for depression. Negative for hallucinations, memory loss, substance abuse and suicidal ideas. The patient is not nervous/anxious and does not have insomnia.    Blood pressure 109/71, pulse 67, temperature (!) 97.1 F (36.2 C), resp. rate 19, height 5' 6 (1.676 m), weight 61.2 kg, last menstrual period 03/26/2023, SpO2 100%, unknown if currently breastfeeding. Body mass index is 21.78 kg/m.  Mental Status Per Nursing Assessment::   On Admission:  NA  Demographic Factors:  Caucasian and Low socioeconomic status  Loss Factors: NA  Historical Factors: Impulsivity  Risk Reduction Factors:   Responsible for children under 82 years of age, Sense of responsibility to family, Employed, Living with another person, especially a relative, Positive social support, and Positive coping skills or problem solving skills  Continued Clinical Symptoms:  Bipolar Disorder:   Depressive phase Personality Disorders:   Cluster B Post Partum Cognitive Features That Contribute To Risk:  None    Suicide Risk:  Minimal: No identifiable suicidal ideation.  Patients presenting with no risk factors but with morbid ruminations; may be classified as minimal risk based on the severity of the depressive symptoms   Follow-up Information     [facility] Follow up.   Why: A debit/credit card MUST be added to account for appointments.  Appointment is scheduled for August 28th at 3:00PM with Josephine for medication management.  Therapy appointment is scheduled for Sept 2nd at 3:00PM with [facility]. You will receive an email with the patient portal to create an account.  Will also require a text/email for set up process.  Cancellation MUST be 48 hours notice to avoid cancellation fees. Contact information: [address],  [place], [place] [address]  Plan Of Care/Follow-up recommendations:  # It is recommended to the  patient to continue psychiatric medications as prescribed, after discharge from the hospital.   # It is recommended to the patient to follow up with your outpatient psychiatric provider and PCP. # It was discussed with the patient, the impact of alcohol, drugs, tobacco have been there overall psychiatric and medical wellbeing, and total abstinence from substance use was recommended. # Prescriptions provided or sent directly to preferred pharmacy at discharge. Patient agreeable to plan. Given the opportunity to ask questions. Appears to feel comfortable with discharge.  # In the event of worsening symptoms, the patient is instructed to call the crisis hotline (988), 911 and or go to the nearest ED for appropriate evaluation and treatment of symptoms. To follow-up with primary care provider for other medical issues, concerns and or health care needs # Patient was discharged home as requested with a plan to follow up as noted above.    [name] Right, PA-C 12/01/2023, 11:45 AM

## 2023-12-01 NOTE — Plan of Care (Signed)
   Problem: Education: Goal: Knowledge of [facility] Education information/materials will improve Outcome: Progressing   Problem: Activity: Goal: Sleeping patterns will improve Outcome: Progressing   Problem: Safety: Goal: Periods of time without injury will increase Outcome: Progressing

## 2023-12-01 NOTE — Progress Notes (Signed)
 Discharge Note:  Patient denies SI/HI/AVH at this time. Discharge instructions, AVS, prescriptions, and transition record reviewed with patient. Patient agrees to comply with medication management, follow-up visit, and outpatient therapy. Patient belongings returned to patient. Patient questions and concerns addressed and answered. Patient ambulatory off unit @1640 , esorted to lobby by staff.  Patient discharged to home with mother and self care

## 2023-12-01 NOTE — Discharge Summary (Signed)
 Physician Discharge Summary Note  Patient:  Deborah Mckee is an 23 y.o., female MRN:  [id number] DOB:  12-10-2000 Patient phone:  [phone] (home)  Patient address:   [address] [place] [address],   Total time spent: 40 min Date of Admission:  11/23/2023 Date of Discharge: 12/01/2023  Reason for Admission:  The patient is a 23 year old G1P1 female who was initally admitted to medicine for PPROM at [redacted]w[redacted]d. PT with extensive psychiatric history including bipolar disorder and borderline personality traits, polysubstance use (UDS positive for cocaine, THC, benzodiazepines), and recent chlamydia infection. She was admitted to the adult psychiatric unit on a voluntary basis for safety, stabilization, and medication management following relapse on substances, writing multiple suicide letters addressed to family members and her unborn child, and exhibiting extreme mood lability in the immediate postpartum period after preterm delivery at 33 weeks. Significant psychosocial stressors included the recent breakup with the baby's father, loss of relationship support, and anticipated CPS involvement. She was unable to contract for safety in the community and was deemed high risk for self-harm, necessitating inpatient psychiatric admission with Q15 minute safety checks and a multidisciplinary treatment approach.  Principal Problem: Bipolar affective disorder, current episode mixed (HCC) Discharge Diagnoses: Principal Problem:   Bipolar affective disorder, current episode mixed (HCC) Active Problems:   Cocaine abuse (HCC)   Marijuana use during pregnancy   Past Psychiatric History: See h and p  Family Psychiatric  History: see h and p Social History:  Social History   Substance and Sexual Activity  Alcohol Use No     Social History   Substance and Sexual Activity  Drug Use Yes   Types: Marijuana, Cocaine   Comment: social    Social History   Socioeconomic History   Marital status:  Single    Spouse name: Not on file   Number of children: Not on file   Years of education: Not on file   Highest education level: Not on file  Occupational History   Not on file  Tobacco Use   Smoking status: Every Day    Types: E-cigarettes   Smokeless tobacco: Never  Vaping Use   Vaping status: Every Day   Substances: Nicotine , Synthetic cannabinoids  Substance and Sexual Activity   Alcohol use: No   Drug use: Yes    Types: Marijuana, Cocaine    Comment: social   Sexual activity: Yes    Birth control/protection: None  Other Topics Concern   Not on file  Social History Narrative   Not on file   Social Drivers of Health   Financial Resource Strain: Low Risk  (05/05/2023)   Received from [facility]   Overall Financial Resource Strain (CARDIA)    Difficulty of Paying Living Expenses: Not hard at all  Food Insecurity: Food Insecurity Present (11/23/2023)   Hunger Vital Sign    Worried About Running Out of Food in the Last Year: Sometimes true    Ran Out of Food in the Last Year: Often true  Transportation Needs: No Transportation Needs (11/23/2023)   PRAPARE - Administrator, Civil Service (Medical): No    Lack of Transportation (Non-Medical): No  Physical Activity: Not on file  Stress: Not on file  Social Connections: Not on file   Past Medical History:  Past Medical History:  Diagnosis Date   Anxiety    Chlamydia    Depression    History of cocaine abuse (HCC)    Vaccine for human  papilloma virus (HPV) types 6, 11, 16, and 18 administered    Vision abnormalities    wears glasses    Past Surgical History:  Procedure Laterality Date   INGUINAL HERNIA PEDIATRIC WITH LAPAROSCOPIC EXAM Left 04/22/2017   Procedure: INGUINAL HERNIA PEDIATRIC WITH LAP LOOK OF PELVIC FLOOR;  Surgeon: Claudius Kaplan, MD;  Location: [facility];  Service: Pediatrics;  Laterality: Left;   INGUINAL HERNIA REPAIR Right 2008   LAPAROTOMY N/A 11/21/2023   Procedure:  Revision of C Section Incision;  Surgeon: Verdon Keen, MD;  Location: [facility];  Service: Gynecology;  Laterality: N/A;   Family History: History reviewed. No pertinent family history.  Hospital Course:    The patient was admitted voluntarily to the inpatient psychiatric unit and initiated on lithium  600 mg daily and lurasidone  20 mg daily, with plans for titration and lithium  level monitoring. Lithium  was titrated to 600 mg bid. She was educated on medication risks, benefits, side effects, and alternatives, and verbalized informed consent. Given lithium  use, she elected not to breastfeed and expressed discomfort with pumping breastmilk while hospitalized.   Over the course of hospitalization, she engaged with the multidisciplinary team including psychiatry, nursing, social work, and therapy staff. Early in admission, she remained emotionally labile, tearful, and focused on discharge; however, with medication stabilization, supportive therapy, and coordination with NICU staff for daily FaceTime contact with her baby, her mood improved. She denied SI/HI throughout the latter part of hospitalization, displayed no self-harm behaviors, and participated appropriately in the unit milieu and group therapies.  Lithium  was titrated level prior to discharge was 0.56 with noted symptom improvemetns. She remained medication compliant and did not require behavioral PRNs. Appetite and sleep stabilized. She demonstrated improved insight into her mental health needs and coping strategies, identified her support system (primarily her mother and friends), and made concrete plans to reside with her mother after discharge. Social work coordinated with DSS and confirmed safe discharge plans.  Detailed risk assessment is complete based on clinical exam and individual risk factors. Acute suicide risk is low and acute violence risk is low. Currently, all modifiable risk of harm to self or others have been addressed, and  the patient is no longer appropriate for the acute inpatient setting. She is able to continue treatment for mental health needs in the community with the supports indicated below. The patient was educated on her discharge plan of care, including medications, follow-up appointments, available mental health resources, and crisis services in the community. She was instructed to call 911 or present to the nearest emergency department should she experience any decompensation in mood or return of suicidal or homicidal ideations. She verbalized understanding of this education and agreed to the plan of care. SW spoke with mother who was picking patient up at time of discharge.   At the time of discharge, the patient was alert, oriented, pleasant, cooperative, linear, logical, and future-oriented. Reviewed medications and she declined to continue gabapentin . She continued to deny SI/HI/AVH, displayed stable mood, and was able to discuss coping mechanisms for ongoing stressors. Outpatient follow-up for psychiatry and psychotherapy was arranged, and prescriptions were sent to her preferred pharmacy. She was discharged in stable condition to her mother's care with a plan to visit her baby daily in the NICU.  Physical Findings: AIMS:  , ,  ,  ,    CIWA:    COWS:        Psychiatric Specialty Exam:  Presentation  General Appearance:  Casual  Eye Contact: Fair  Speech: Clear and Coherent  Speech Volume: Normal    Mood and Affect  Mood: Euthymic  Affect: Congruent   Thought Process  Thought Processes: Coherent; Linear  Descriptions of Associations:Intact  Orientation:Full (Time, Place and Person)  Thought Content:WDL  Hallucinations:Hallucinations: None  Ideas of Reference:None  Suicidal Thoughts:Suicidal Thoughts: No  Homicidal Thoughts:Homicidal Thoughts: No   Sensorium  Memory: Immediate Fair; Recent Fair  Judgment: Good  Insight: Good   Executive Functions   Concentration: Good  Attention Span: Good  Recall: Good  Fund of Knowledge: Good  Language: Good   Psychomotor Activity  Psychomotor Activity: Psychomotor Activity: Normal  Musculoskeletal: Strength & Muscle Tone: within normal limits Gait & Station: normal Assets  Assets: Manufacturing systems engineer; Desire for Improvement   Sleep  Sleep: Sleep: Good    Physical Exam: Physical Exam Vitals and nursing note reviewed.  HENT:     Head: Atraumatic.  Eyes:     Extraocular Movements: Extraocular movements intact.  Pulmonary:     Effort: Pulmonary effort is normal.  Neurological:     Mental Status: She is alert and oriented to person, place, and time.    Review of Systems  Psychiatric/Behavioral:  Negative for hallucinations, memory loss, substance abuse and suicidal ideas. The patient does not have insomnia.    Blood pressure 111/65, pulse 85, temperature 98.2 F (36.8 C), resp. rate 18, height 5' 6 (1.676 m), weight 61.2 kg, last menstrual period 03/26/2023, SpO2 98%, unknown if currently breastfeeding. Body mass index is 21.78 kg/m.   Social History   Tobacco Use  Smoking Status Every Day   Types: E-cigarettes  Smokeless Tobacco Never   Tobacco Cessation:  A prescription for an FDA-approved tobacco cessation medication was offered at discharge and the patient refused   Blood Alcohol level:  Lab Results  Component Value Date   ETH 29 (H) 09/12/2022    Metabolic Disorder Labs:  Lab Results  Component Value Date   HGBA1C 5.0 11/29/2023   MPG 97 11/29/2023   MPG 85.32 02/12/2021   No results found for: PROLACTIN Lab Results  Component Value Date   CHOL 198 11/29/2023   TRIG 107 11/29/2023   HDL 81 11/29/2023   CHOLHDL 2.4 11/29/2023   VLDL 21 11/29/2023   LDLCALC 96 11/29/2023   LDLCALC 76 02/12/2021    See Psychiatric Specialty Exam and Suicide Risk Assessment completed by Attending Physician prior to discharge.  Discharge  destination:  Home  Is patient on multiple antipsychotic therapies at discharge:  No   Has Patient had three or more failed trials of antipsychotic monotherapy by history:  No  Recommended Plan for Multiple Antipsychotic Therapies: NA  Discharge Instructions     No wound care   Complete by: As directed       Allergies as of 12/01/2023       Reactions   Penicillins Rash, Other (See Comments)        Medication List     STOP taking these medications    cephALEXin  500 MG capsule Commonly known as: KEFLEX    oxyCODONE  5 MG immediate release tablet Commonly known as: Oxy IR/ROXICODONE    senna-docusate 8.6-50 MG tablet Commonly known as: Senokot-S       TAKE these medications      Indication  acetaminophen  500 MG tablet Commonly known as: TYLENOL  Take 2 tablets (1,000 mg total) by mouth every 6 (six) hours as needed for mild pain (pain score 1-3) or fever.  Indication: Pain  ferrous sulfate  325 (65 FE) MG tablet Take 1 tablet (325 mg total) by mouth 2 (two) times daily with a meal.  Indication: Iron Deficiency   ibuprofen  600 MG tablet Commonly known as: ADVIL  Take 1 tablet (600 mg total) by mouth every 6 (six) hours as needed for fever, mild pain (pain score 1-3) or cramping.  Indication: Pain   lithium  carbonate 300 MG ER tablet Commonly known as: LITHOBID  Take 2 tablets (600 mg total) by mouth every 12 (twelve) hours. What changed:  how much to take how to take this when to take this additional instructions  Indication: Manic-Depression   lurasidone  20 MG Tabs tablet Commonly known as: LATUDA  Take 1 tablet (20 mg total) by mouth daily with supper.  Indication: Depressive Phase of Manic-Depression   simethicone  80 MG chewable tablet Commonly known as: MYLICON Chew 1 tablet (80 mg total) by mouth 3 (three) times daily after meals.  Indication: Gas        Follow-up Information     [facility] Follow up.    Why: A debit/credit card MUST be added to account for appointments.  Appointment is scheduled for August 28th at 3:00PM with Josephine for medication management.  Therapy appointment is scheduled for Sept 2nd at 3:00PM with [facility]. You will receive an email with the patient portal to create an account.  Will also require a text/email for set up process.  Cancellation MUST be 48 hours notice to avoid cancellation fees. Contact information: [address],  [place], [place] [address]                Follow-up recommendations:    # It is recommended to the patient to continue psychiatric medications as prescribed, after discharge from the hospital.   # It is recommended to the patient to follow up with your outpatient psychiatric provider and PCP. # It was discussed with the patient, the impact of alcohol, drugs, tobacco have been there overall psychiatric and medical wellbeing, and total abstinence from substance use was recommended. # Prescriptions provided or sent directly to preferred pharmacy at discharge. Patient agreeable to plan. Given the opportunity to ask questions. Appears to feel comfortable with discharge.  # In the event of worsening symptoms, the patient is instructed to call the crisis hotline (988), 911 and or go to the nearest ED for appropriate evaluation and treatment of symptoms. To follow-up with primary care provider for other medical issues, concerns and or health care needs # Patient was discharged home as requested with a plan to follow up as noted above.      Signed: [name] Right, PA-C 12/01/2023, 4:53 PM

## 2023-12-01 NOTE — Progress Notes (Signed)
  [facility] Adult Case Management Discharge Plan :  Will you be returning to the same living situation after discharge:  Yes,  pt reports that she is going to her mothers home.  At discharge, do you have transportation home?: Yes,  mother will provide pt with transportation.  Do you have the ability to pay for your medications: Yes,  BLUE CROSS BLUE SHIELD / BCBS COMM PPO  Release of information consent forms completed and in the chart;  Patient's signature needed at discharge.  Patient to Follow up at:  Follow-up Information     [facility] [place] Follow up.   Why: A debit/credit card MUST be added to account for appointments.  Appointment is scheduled for August 28th at 3:00PM with Josephine for medication management.  Therapy appointment is scheduled for Sept 2nd at 3:00PM with [facility]. You will receive an email with the patient portal to create an account.  Will also require a text/email for set up process.  Cancellation MUST be 48 hours notice to avoid cancellation fees. Contact information: [address],  [place], [place] [address]                Next level of care provider has access to [facility] Link:no  Safety Planning and Suicide Prevention discussed: Yes,  SPE completed with the patient and patients mother.      Has patient been referred to the Quitline?: Patient refused referral for treatment  Patient has been referred for addiction treatment: Yes, the patient will follow up with an outpatient provider for substance use disorder. Psychiatrist/APP: appointment made  [name], LCSW 12/01/2023, 3:54 PM

## 2023-12-01 NOTE — Group Note (Signed)
 Date:  12/01/2023 Time:  4:11 PM  Group Topic/Focus:  Activity Group: The focus of the group is to promote activity for the patients and encourage them to go outside to the courtyard and get some fresh air and some exercise.    Participation Level:  Active  Participation Quality:  Appropriate  Affect:  Appropriate  Cognitive:  Appropriate  Insight: Appropriate  Engagement in Group:  Engaged  Modes of Intervention:  Activity  Additional Comments:    Deborah Mckee Deborah Mckee 12/01/2023, 4:11 PM

## 2023-12-01 NOTE — Group Note (Signed)
 Date:  12/01/2023 Time:  12:58 PM  Group Topic/Focus:  Building Self Esteem:   The Focus of this group is helping patients become aware of the effects of self-esteem on their lives, the things they and others do that enhance or undermine their self-esteem, seeing the relationship between their level of self-esteem and the choices they make and learning ways to enhance self-esteem.    Participation Level:  Did Not Attend   Deborah Mckee 12/01/2023, 12:58 PM

## 2023-12-07 NOTE — Clinical Social Work Maternal (Incomplete)
  CLINICAL SOCIAL WORK MATERNAL/CHILD NOTE  Patient Details  Name: Deborah Mckee MRN: [id number] Date of Birth: Jul 21, 2000  Date:  12/07/2023  Clinical Social Worker Initiating Note:    Date/Time: Initiated:   /      Child's Name:      Biological Parents:      Need for Interpreter:      Reason for Referral:      Address:  [address] [place] [address]    Phone number:  [phone] (home)     Additional phone number: ***  Household Members/Support Persons (HM/SP):       HM/SP Name Relationship DOB or Age  HM/SP -1        HM/SP -2        HM/SP -3        HM/SP -4        HM/SP -5        HM/SP -6        HM/SP -7        HM/SP -8          Natural Supports (not living in the home):      Professional Supports:     Employment:     Type of Work:     Education:      Homebound arranged:    Architect:      Other Resources:      Cultural/Religious Considerations Which May Impact Care:  ***  Strengths:      Psychotropic Medications:         Pediatrician:       Pediatrician List:   [facility]      Pediatrician Fax Number:    Risk Factors/Current Problems:      Cognitive State:      Mood/Affect:      CSW Assessment: ***  CSW Plan/Description:       Deborah JINNY Ruts, LCSW 12/07/2023, 12:04 PM

## 2023-12-16 DIAGNOSIS — F331 Major depressive disorder, recurrent, moderate: Secondary | ICD-10-CM | POA: Diagnosis not present

## 2023-12-16 DIAGNOSIS — F411 Generalized anxiety disorder: Secondary | ICD-10-CM | POA: Diagnosis not present

## 2023-12-16 DIAGNOSIS — F53 Postpartum depression: Secondary | ICD-10-CM | POA: Diagnosis not present

## 2023-12-21 DIAGNOSIS — F411 Generalized anxiety disorder: Secondary | ICD-10-CM | POA: Diagnosis not present

## 2023-12-21 DIAGNOSIS — F331 Major depressive disorder, recurrent, moderate: Secondary | ICD-10-CM | POA: Diagnosis not present

## 2023-12-21 DIAGNOSIS — F53 Postpartum depression: Secondary | ICD-10-CM | POA: Diagnosis not present

## 2023-12-22 DIAGNOSIS — F411 Generalized anxiety disorder: Secondary | ICD-10-CM | POA: Diagnosis not present

## 2023-12-22 DIAGNOSIS — F53 Postpartum depression: Secondary | ICD-10-CM | POA: Diagnosis not present

## 2023-12-22 DIAGNOSIS — F331 Major depressive disorder, recurrent, moderate: Secondary | ICD-10-CM | POA: Diagnosis not present

## 2023-12-31 DIAGNOSIS — Z419 Encounter for procedure for purposes other than remedying health state, unspecified: Secondary | ICD-10-CM | POA: Diagnosis not present

## 2024-01-05 DIAGNOSIS — F331 Major depressive disorder, recurrent, moderate: Secondary | ICD-10-CM | POA: Diagnosis not present

## 2024-01-05 DIAGNOSIS — F411 Generalized anxiety disorder: Secondary | ICD-10-CM | POA: Diagnosis not present

## 2024-01-05 DIAGNOSIS — F53 Postpartum depression: Secondary | ICD-10-CM | POA: Diagnosis not present

## 2024-01-07 DIAGNOSIS — R87612 Low grade squamous intraepithelial lesion on cytologic smear of cervix (LGSIL): Secondary | ICD-10-CM | POA: Diagnosis not present

## 2024-01-07 DIAGNOSIS — Z124 Encounter for screening for malignant neoplasm of cervix: Secondary | ICD-10-CM | POA: Diagnosis not present

## 2024-01-07 DIAGNOSIS — Z113 Encounter for screening for infections with a predominantly sexual mode of transmission: Secondary | ICD-10-CM | POA: Diagnosis not present

## 2024-01-07 DIAGNOSIS — Z114 Encounter for screening for human immunodeficiency virus [HIV]: Secondary | ICD-10-CM | POA: Diagnosis not present

## 2024-01-07 DIAGNOSIS — Z1331 Encounter for screening for depression: Secondary | ICD-10-CM | POA: Diagnosis not present

## 2024-01-07 DIAGNOSIS — N898 Other specified noninflammatory disorders of vagina: Secondary | ICD-10-CM | POA: Diagnosis not present

## 2024-01-12 DIAGNOSIS — F411 Generalized anxiety disorder: Secondary | ICD-10-CM | POA: Diagnosis not present

## 2024-01-12 DIAGNOSIS — F53 Postpartum depression: Secondary | ICD-10-CM | POA: Diagnosis not present

## 2024-01-12 DIAGNOSIS — F331 Major depressive disorder, recurrent, moderate: Secondary | ICD-10-CM | POA: Diagnosis not present

## 2024-01-26 DIAGNOSIS — F53 Postpartum depression: Secondary | ICD-10-CM | POA: Diagnosis not present

## 2024-01-26 DIAGNOSIS — F411 Generalized anxiety disorder: Secondary | ICD-10-CM | POA: Diagnosis not present

## 2024-01-26 DIAGNOSIS — F331 Major depressive disorder, recurrent, moderate: Secondary | ICD-10-CM | POA: Diagnosis not present

## 2024-01-27 DIAGNOSIS — F331 Major depressive disorder, recurrent, moderate: Secondary | ICD-10-CM | POA: Diagnosis not present

## 2024-01-27 DIAGNOSIS — F411 Generalized anxiety disorder: Secondary | ICD-10-CM | POA: Diagnosis not present

## 2024-01-27 DIAGNOSIS — F53 Postpartum depression: Secondary | ICD-10-CM | POA: Diagnosis not present

## 2024-02-02 DIAGNOSIS — F53 Postpartum depression: Secondary | ICD-10-CM | POA: Diagnosis not present

## 2024-02-02 DIAGNOSIS — F331 Major depressive disorder, recurrent, moderate: Secondary | ICD-10-CM | POA: Diagnosis not present

## 2024-02-02 DIAGNOSIS — F411 Generalized anxiety disorder: Secondary | ICD-10-CM | POA: Diagnosis not present

## 2024-02-16 DIAGNOSIS — F331 Major depressive disorder, recurrent, moderate: Secondary | ICD-10-CM | POA: Diagnosis not present

## 2024-02-16 DIAGNOSIS — F53 Postpartum depression: Secondary | ICD-10-CM | POA: Diagnosis not present

## 2024-02-16 DIAGNOSIS — F411 Generalized anxiety disorder: Secondary | ICD-10-CM | POA: Diagnosis not present

## 2024-02-23 DIAGNOSIS — F331 Major depressive disorder, recurrent, moderate: Secondary | ICD-10-CM | POA: Diagnosis not present

## 2024-02-23 DIAGNOSIS — F53 Postpartum depression: Secondary | ICD-10-CM | POA: Diagnosis not present

## 2024-02-23 DIAGNOSIS — F411 Generalized anxiety disorder: Secondary | ICD-10-CM | POA: Diagnosis not present

## 2024-02-24 DIAGNOSIS — F3181 Bipolar II disorder: Secondary | ICD-10-CM | POA: Diagnosis not present

## 2024-02-24 DIAGNOSIS — F1591 Other stimulant use, unspecified, in remission: Secondary | ICD-10-CM | POA: Diagnosis not present

## 2024-03-01 DIAGNOSIS — F411 Generalized anxiety disorder: Secondary | ICD-10-CM | POA: Diagnosis not present

## 2024-03-01 DIAGNOSIS — F331 Major depressive disorder, recurrent, moderate: Secondary | ICD-10-CM | POA: Diagnosis not present

## 2024-03-01 DIAGNOSIS — F53 Postpartum depression: Secondary | ICD-10-CM | POA: Diagnosis not present

## 2024-03-08 DIAGNOSIS — F53 Postpartum depression: Secondary | ICD-10-CM | POA: Diagnosis not present

## 2024-03-08 DIAGNOSIS — F411 Generalized anxiety disorder: Secondary | ICD-10-CM | POA: Diagnosis not present

## 2024-03-08 DIAGNOSIS — F331 Major depressive disorder, recurrent, moderate: Secondary | ICD-10-CM | POA: Diagnosis not present

## 2024-03-22 DIAGNOSIS — F411 Generalized anxiety disorder: Secondary | ICD-10-CM | POA: Diagnosis not present

## 2024-03-22 DIAGNOSIS — F53 Postpartum depression: Secondary | ICD-10-CM | POA: Diagnosis not present

## 2024-03-22 DIAGNOSIS — F331 Major depressive disorder, recurrent, moderate: Secondary | ICD-10-CM | POA: Diagnosis not present

## 2024-03-29 DIAGNOSIS — F411 Generalized anxiety disorder: Secondary | ICD-10-CM | POA: Diagnosis not present

## 2024-03-29 DIAGNOSIS — F331 Major depressive disorder, recurrent, moderate: Secondary | ICD-10-CM | POA: Diagnosis not present

## 2024-03-30 DIAGNOSIS — Z30430 Encounter for insertion of intrauterine contraceptive device: Secondary | ICD-10-CM | POA: Diagnosis not present

## 2024-03-31 DIAGNOSIS — Z419 Encounter for procedure for purposes other than remedying health state, unspecified: Secondary | ICD-10-CM | POA: Diagnosis not present

## 2024-04-05 DIAGNOSIS — F53 Postpartum depression: Secondary | ICD-10-CM | POA: Diagnosis not present

## 2024-04-05 DIAGNOSIS — F411 Generalized anxiety disorder: Secondary | ICD-10-CM | POA: Diagnosis not present

## 2024-04-05 DIAGNOSIS — F331 Major depressive disorder, recurrent, moderate: Secondary | ICD-10-CM | POA: Diagnosis not present
